# Patient Record
Sex: Female | Born: 1935 | Race: White | Hispanic: No | Smoking: Never smoker
Health system: Southern US, Community
[De-identification: ages and names within clinical notes are randomized; demographics above are authoritative.]

## PROBLEM LIST (undated history)

## (undated) DIAGNOSIS — I10 Essential (primary) hypertension: Secondary | ICD-10-CM

## (undated) DIAGNOSIS — R32 Unspecified urinary incontinence: Secondary | ICD-10-CM

## (undated) DIAGNOSIS — M810 Age-related osteoporosis without current pathological fracture: Secondary | ICD-10-CM

## (undated) DIAGNOSIS — D649 Anemia, unspecified: Secondary | ICD-10-CM

## (undated) DIAGNOSIS — K5900 Constipation, unspecified: Secondary | ICD-10-CM

## (undated) DIAGNOSIS — F329 Major depressive disorder, single episode, unspecified: Secondary | ICD-10-CM

## (undated) DIAGNOSIS — G4700 Insomnia, unspecified: Secondary | ICD-10-CM

## (undated) DIAGNOSIS — M1990 Unspecified osteoarthritis, unspecified site: Secondary | ICD-10-CM

## (undated) DIAGNOSIS — I4510 Unspecified right bundle-branch block: Secondary | ICD-10-CM

## (undated) DIAGNOSIS — E785 Hyperlipidemia, unspecified: Secondary | ICD-10-CM

## (undated) DIAGNOSIS — J45909 Unspecified asthma, uncomplicated: Secondary | ICD-10-CM

## (undated) DIAGNOSIS — M25559 Pain in unspecified hip: Secondary | ICD-10-CM

## (undated) DIAGNOSIS — N390 Urinary tract infection, site not specified: Secondary | ICD-10-CM

## (undated) DIAGNOSIS — R609 Edema, unspecified: Secondary | ICD-10-CM

## (undated) DIAGNOSIS — F32A Depression, unspecified: Secondary | ICD-10-CM

## (undated) DIAGNOSIS — I2510 Atherosclerotic heart disease of native coronary artery without angina pectoris: Secondary | ICD-10-CM

## (undated) DIAGNOSIS — K219 Gastro-esophageal reflux disease without esophagitis: Secondary | ICD-10-CM

## (undated) DIAGNOSIS — Z9181 History of falling: Secondary | ICD-10-CM

## (undated) DIAGNOSIS — F4321 Adjustment disorder with depressed mood: Secondary | ICD-10-CM

## (undated) DIAGNOSIS — F0280 Dementia in other diseases classified elsewhere without behavioral disturbance: Secondary | ICD-10-CM

## (undated) HISTORY — DX: Constipation, unspecified: K59.00

## (undated) HISTORY — PX: CHOLECYSTECTOMY: SHX55

## (undated) HISTORY — DX: Edema, unspecified: R60.9

## (undated) HISTORY — DX: History of falling: Z91.81

## (undated) HISTORY — DX: Insomnia, unspecified: G47.00

## (undated) HISTORY — DX: Urinary tract infection, site not specified: N39.0

## (undated) HISTORY — DX: Pain in unspecified hip: M25.559

## (undated) HISTORY — DX: Depression, unspecified: F32.A

## (undated) HISTORY — DX: Hyperlipidemia, unspecified: E78.5

## (undated) HISTORY — DX: Age-related osteoporosis without current pathological fracture: M81.0

## (undated) HISTORY — DX: Dementia in other diseases classified elsewhere, unspecified severity, without behavioral disturbance, psychotic disturbance, mood disturbance, and anxiety: F02.80

## (undated) HISTORY — DX: Major depressive disorder, single episode, unspecified: F32.9

## (undated) HISTORY — DX: Atherosclerotic heart disease of native coronary artery without angina pectoris: I25.10

## (undated) HISTORY — DX: Unspecified right bundle-branch block: I45.10

## (undated) HISTORY — DX: Adjustment disorder with depressed mood: F43.21

## (undated) HISTORY — PX: ABDOMINAL HYSTERECTOMY: SHX81

## (undated) HISTORY — DX: Gastro-esophageal reflux disease without esophagitis: K21.9

## (undated) HISTORY — DX: Unspecified urinary incontinence: R32

## (undated) HISTORY — DX: Unspecified asthma, uncomplicated: J45.909

## (undated) HISTORY — DX: Anemia, unspecified: D64.9

---

## 2001-05-22 ENCOUNTER — Emergency Department (HOSPITAL_COMMUNITY): Admission: EM | Admit: 2001-05-22 | Discharge: 2001-05-22 | Payer: Self-pay | Admitting: Emergency Medicine

## 2002-11-21 ENCOUNTER — Ambulatory Visit (HOSPITAL_COMMUNITY): Admission: RE | Admit: 2002-11-21 | Discharge: 2002-11-21 | Payer: Self-pay | Admitting: Gastroenterology

## 2002-11-21 ENCOUNTER — Encounter: Payer: Self-pay | Admitting: Gastroenterology

## 2003-08-04 ENCOUNTER — Encounter: Admission: RE | Admit: 2003-08-04 | Discharge: 2003-08-04 | Payer: Self-pay | Admitting: Orthopaedic Surgery

## 2006-06-15 ENCOUNTER — Encounter: Admission: RE | Admit: 2006-06-15 | Discharge: 2006-06-15 | Payer: Self-pay | Admitting: Orthopaedic Surgery

## 2008-04-10 ENCOUNTER — Encounter: Admission: RE | Admit: 2008-04-10 | Discharge: 2008-06-12 | Payer: Self-pay | Admitting: Orthopaedic Surgery

## 2008-06-14 ENCOUNTER — Encounter: Admission: RE | Admit: 2008-06-14 | Discharge: 2008-08-29 | Payer: Self-pay | Admitting: Orthopaedic Surgery

## 2008-06-23 ENCOUNTER — Encounter: Admission: RE | Admit: 2008-06-23 | Discharge: 2008-06-23 | Payer: Self-pay | Admitting: Orthopaedic Surgery

## 2008-07-28 ENCOUNTER — Encounter: Admission: RE | Admit: 2008-07-28 | Discharge: 2008-07-28 | Payer: Self-pay | Admitting: Internal Medicine

## 2009-01-19 ENCOUNTER — Emergency Department (HOSPITAL_COMMUNITY): Admission: EM | Admit: 2009-01-19 | Discharge: 2009-01-19 | Payer: Self-pay | Admitting: Emergency Medicine

## 2009-09-13 ENCOUNTER — Encounter: Admission: RE | Admit: 2009-09-13 | Discharge: 2009-09-13 | Payer: Self-pay | Admitting: Internal Medicine

## 2010-05-11 ENCOUNTER — Encounter: Payer: Self-pay | Admitting: Orthopaedic Surgery

## 2010-07-28 ENCOUNTER — Encounter (INDEPENDENT_AMBULATORY_CARE_PROVIDER_SITE_OTHER): Payer: MEDICARE

## 2010-07-28 DIAGNOSIS — R002 Palpitations: Secondary | ICD-10-CM

## 2010-09-05 NOTE — Op Note (Signed)
NAME:  Joann Baker, Joann Baker                          ACCOUNT NO.:  [id number]   MEDICAL RECORD NO.:  [id number]                   PATIENT TYPE:  AMB   LOCATION:  ENDO                                 FACILITY:  [facility]   PHYSICIAN:  Petra Kuba, M.D.                 DATE OF BIRTH:  March 21, 1925   DATE OF PROCEDURE:  11/21/2002  DATE OF DISCHARGE:                                 OPERATIVE REPORT   PROCEDURE:  Colonoscopy.   INDICATION:  The patient with longstanding irritable bowel, well overdue for  colonic screening.  Consent was signed after risks, benefits, methods,  options thoroughly discussed in the office.   MEDICINES USED:  1. Demerol 50.  2. Versed 5.   DESCRIPTION OF PROCEDURE:  Rectal inspection was pertinent for external  hemorrhoids, small.  Digital exam was negative.  First, the pediatric video  adjustable colonoscope was inserted and with some difficulty due to looping,  tortuosity and despite rolling her on her back and her right side, was only  able to advance to the approximate level of the hepatic flexure or proximal  transverse.  She did have discomfort with advancing, and we were  uncomfortable pushing any harder, so we elected to withdraw.  No  abnormalities but some rare left-sided diverticula were seen on withdrawal.  Anorectal pull-through and retroflexion confirmed some hemorrhoids.  We went  ahead and reinserted the upper endoscope and were easily able to advance to  approximately the mid transverse but despite abdominal pressure and rolling  her on her back, we ran out of scope and could not advance any further.  This scope was withdrawn as well without additional findings.  Again,  anorectal pull-through and retroflexion confirmed some hemorrhoids.  Scope  was reinserted a short ways up the left side of the colon; air was  suctioned, scope removed.  The patient tolerated the procedure well.  There  was no obvious immediate complication.   ENDOSCOPIC  DIAGNOSES:  1. Internal-external hemorrhoids.  2. Few sigmoid diverticula.  3. Unable to advance either the peds colonoscope or the upper endoscope     passed the probable proximal transverse.  4. No other abnormalities seen.   PLAN:  1. We will go ahead and get a contrast barium enema since she is well-     prepped today just to completely screen her colon.  2.     Otherwise, return care to Larina Earthly, M.D. for the customary health care     maintenance to include yearly rectals and guaiacs.  3. Happy to see back p.r.n.                                               Petra Kuba, M.D.    MEM/MEDQ  D:  11/21/2002  T:  11/21/2002  Job:  [id number]   cc:   Larina Earthly, M.D.  [address]  [place]  [place] [address]  Fax: [phone]

## 2011-06-18 ENCOUNTER — Other Ambulatory Visit: Payer: Self-pay

## 2011-06-18 ENCOUNTER — Emergency Department (HOSPITAL_COMMUNITY): Payer: Medicare Other

## 2011-06-18 ENCOUNTER — Encounter (HOSPITAL_COMMUNITY): Payer: Self-pay

## 2011-06-18 ENCOUNTER — Encounter (HOSPITAL_COMMUNITY)
Admission: EM | Disposition: A | Discharge status: Skilled Nursing Facility | Payer: Self-pay | Source: Ambulatory Visit | Attending: Orthopedic Surgery

## 2011-06-18 ENCOUNTER — Inpatient Hospital Stay (HOSPITAL_COMMUNITY)
Admission: EM | Admit: 2011-06-18 | Discharge: 2011-06-23 | DRG: 482 | Disposition: A | Discharge status: Skilled Nursing Facility | Payer: Medicare Other | Source: Ambulatory Visit | Attending: Orthopedic Surgery | Admitting: Orthopedic Surgery

## 2011-06-18 ENCOUNTER — Inpatient Hospital Stay (HOSPITAL_COMMUNITY): Payer: Medicare Other

## 2011-06-18 DIAGNOSIS — W010XXA Fall on same level from slipping, tripping and stumbling without subsequent striking against object, initial encounter: Secondary | ICD-10-CM | POA: Diagnosis present

## 2011-06-18 DIAGNOSIS — I10 Essential (primary) hypertension: Secondary | ICD-10-CM | POA: Diagnosis present

## 2011-06-18 DIAGNOSIS — R3981 Functional urinary incontinence: Secondary | ICD-10-CM | POA: Diagnosis present

## 2011-06-18 DIAGNOSIS — Z888 Allergy status to other drugs, medicaments and biological substances status: Secondary | ICD-10-CM

## 2011-06-18 DIAGNOSIS — M1990 Unspecified osteoarthritis, unspecified site: Secondary | ICD-10-CM | POA: Diagnosis present

## 2011-06-18 DIAGNOSIS — F0390 Unspecified dementia without behavioral disturbance: Secondary | ICD-10-CM | POA: Diagnosis present

## 2011-06-18 DIAGNOSIS — S7292XA Unspecified fracture of left femur, initial encounter for closed fracture: Secondary | ICD-10-CM

## 2011-06-18 DIAGNOSIS — Z7982 Long term (current) use of aspirin: Secondary | ICD-10-CM

## 2011-06-18 DIAGNOSIS — S72409A Unspecified fracture of lower end of unspecified femur, initial encounter for closed fracture: Principal | ICD-10-CM | POA: Diagnosis present

## 2011-06-18 DIAGNOSIS — Z79899 Other long term (current) drug therapy: Secondary | ICD-10-CM

## 2011-06-18 HISTORY — DX: Essential (primary) hypertension: I10

## 2011-06-18 HISTORY — DX: Unspecified osteoarthritis, unspecified site: M19.90

## 2011-06-18 LAB — DIFFERENTIAL
Basophils Absolute: 0.1 10*3/uL (ref 0.0–0.1)
Basophils Relative: 0 % (ref 0–1)
Eosinophils Absolute: 0.1 K/uL (ref 0.0–0.7)
Eosinophils Relative: 1 % (ref 0–5)
Lymphocytes Relative: 23 % (ref 12–46)
Lymphs Abs: 2.9 10*3/uL (ref 0.7–4.0)
Monocytes Absolute: 0.7 K/uL (ref 0.1–1.0)
Monocytes Relative: 6 % (ref 3–12)
Neutro Abs: 8.8 10*3/uL — ABNORMAL HIGH (ref 1.7–7.7)
Neutrophils Relative %: 70 % (ref 43–77)

## 2011-06-18 LAB — CBC
HCT: 37 % (ref 36.0–46.0)
Hemoglobin: 12.7 g/dL (ref 12.0–15.0)
MCH: 30.9 pg (ref 26.0–34.0)
MCHC: 34.3 g/dL (ref 30.0–36.0)
MCV: 90 fL (ref 78.0–100.0)
Platelets: 200 10*3/uL (ref 150–400)
RBC: 4.11 MIL/uL (ref 3.87–5.11)
RDW: 13.5 % (ref 11.5–15.5)
WBC: 12.6 K/uL — ABNORMAL HIGH (ref 4.0–10.5)

## 2011-06-18 LAB — URINALYSIS, ROUTINE W REFLEX MICROSCOPIC
Bilirubin Urine: NEGATIVE
Glucose, UA: NEGATIVE mg/dL
Hgb urine dipstick: NEGATIVE
Ketones, ur: NEGATIVE mg/dL
Leukocytes, UA: NEGATIVE
Nitrite: NEGATIVE
Protein, ur: NEGATIVE mg/dL
Specific Gravity, Urine: 1.014 (ref 1.005–1.030)
Urobilinogen, UA: 0.2 mg/dL (ref 0.0–1.0)
pH: 7 (ref 5.0–8.0)

## 2011-06-18 LAB — BASIC METABOLIC PANEL
BUN: 17 mg/dL (ref 6–23)
Chloride: 101 mEq/L (ref 96–112)
Glucose, Bld: 135 mg/dL — ABNORMAL HIGH (ref 70–99)
Potassium: 4.2 mEq/L (ref 3.5–5.1)
Sodium: 139 mEq/L (ref 135–145)

## 2011-06-18 LAB — BASIC METABOLIC PANEL WITH GFR
CO2: 27 meq/L (ref 19–32)
Calcium: 11.1 mg/dL — ABNORMAL HIGH (ref 8.4–10.5)
Creatinine, Ser: 1.06 mg/dL (ref 0.50–1.10)
GFR calc Af Amer: 54 mL/min — ABNORMAL LOW (ref >90)
GFR calc non Af Amer: 47 mL/min — ABNORMAL LOW (ref >90)

## 2011-06-18 SURGERY — INSERTION, INTRAMEDULLARY ROD, FEMUR
Anesthesia: General | Laterality: Left

## 2011-06-18 MED ORDER — HYDROMORPHONE HCL PF 1 MG/ML IJ SOLN
1.0000 mg | Freq: Once | INTRAMUSCULAR | Status: AC
  Administered 2011-06-18: 1 mg via INTRAVENOUS
  Filled 2011-06-18: qty 1

## 2011-06-18 MED ORDER — DEXTROSE-NACL 5-0.45 % IV SOLN
INTRAVENOUS | Status: DC
  Administered 2011-06-19 – 2011-06-20 (×2): via INTRAVENOUS

## 2011-06-18 MED ORDER — ONDANSETRON HCL 4 MG/2ML IJ SOLN
4.0000 mg | Freq: Once | INTRAMUSCULAR | Status: AC
  Administered 2011-06-18: 4 mg via INTRAVENOUS

## 2011-06-18 MED ORDER — ONDANSETRON HCL 4 MG/2ML IJ SOLN
INTRAMUSCULAR | Status: AC
  Administered 2011-06-18: 4 mg via INTRAVENOUS
  Filled 2011-06-18: qty 2

## 2011-06-18 MED ORDER — MORPHINE SULFATE 4 MG/ML IJ SOLN
4.0000 mg | Freq: Once | INTRAMUSCULAR | Status: AC
  Administered 2011-06-18: 4 mg via INTRAVENOUS
  Filled 2011-06-18: qty 1

## 2011-06-18 NOTE — ED Notes (Signed)
Pt back from X-Ray.  

## 2011-06-18 NOTE — Progress Notes (Signed)
Orthopedic Tech Progress Note Patient Details:  Joann Baker 06/21/1924 [id number]  Patient ID: [name], female   DOB: 1924/12/26, 78 y.o.   MRN: [id number]   [name] 06/18/2011, 10:29 PM

## 2011-06-18 NOTE — ED Notes (Signed)
Pt has +pulses and movement in lt leg

## 2011-06-18 NOTE — ED Notes (Signed)
Jewelry given to son, Chip Francesco Runner, as follows: yellow colored earring x2, 1 ring with blue colored stones surrounding 1 clear stone, 1 ring with cluster of 8 clear stones, 1 ring with 3 clear stones, and 1 seiko watch; pt aware that all jewelry has been given to her son and is in agreement

## 2011-06-18 NOTE — Progress Notes (Signed)
Orthopedic Tech Progress Note Patient Details:  Joann Baker 1924-09-13 [id number]  Musculoskeletal Traction Type of Traction: Joann Baker Better Traction Traction Location: (L) LE    [name] 06/18/2011, 10:29 PM

## 2011-06-18 NOTE — ED Provider Notes (Signed)
History     CSN: [id number]  Arrival date & time 06/18/11  1748   First MD Initiated Contact with Patient 06/18/11 1803      Chief Complaint  Patient presents with  . Fall    Pt fell and felt pop in left thigh.  +PMS, fentanyl , #20 lt hand.  Pt told EMS that she did not pass out and fell, thinks her leg gave out.  Denies neck and back pain, denies LOC.    (Consider location/radiation/quality/duration/timing/severity/associated sxs/prior treatment) Patient is a 78 y.o. female presenting with leg pain. The history is provided by the patient.  Leg Pain  The incident occurred 1 to 2 hours ago. The incident occurred at home. The injury mechanism was a fall. The pain is present in the left thigh. The quality of the pain is described as aching. The pain is at a severity of 10/10. The pain is severe. The pain has been constant since onset. Associated symptoms include inability to bear weight and loss of motion. Pertinent negatives include no numbness, no loss of sensation and no tingling. She reports no foreign bodies present. The symptoms are aggravated by bearing weight and activity. She has tried nothing for the symptoms.    Past Medical History  Diagnosis Date  . Hypertension   . DJD (degenerative joint disease)     No past surgical history on file.  No family history on file.  History  Substance Use Topics  . Smoking status: Not on file  . Smokeless tobacco: Not on file  . Alcohol Use:     OB History    Grav Para Term Preterm Abortions TAB SAB Ect Mult Living                  Review of Systems  Respiratory: Negative for shortness of breath.   Cardiovascular: Negative for chest pain.  Gastrointestinal: Negative for nausea, vomiting, abdominal pain and diarrhea.  Neurological: Negative for dizziness, tingling, syncope, weakness and numbness.  All other systems reviewed and are negative.    Allergies  Flovent diskus; Fosamax; Omeprazole; and Symbicort  Home  Medications  No current outpatient prescriptions on file.  BP 157/60  Temp(Src) 97.8 F (36.6 C) (Oral)  Resp 16  SpO2 98%  Physical Exam  Nursing note and vitals reviewed. Constitutional: She is oriented to person, place, and time. She appears well-developed and well-nourished. No distress.  HENT:  Head: Normocephalic and atraumatic.  Mouth/Throat: Oropharynx is clear and moist.  Eyes: EOM are normal. Pupils are equal, round, and reactive to light.  Neck: Normal range of motion. Neck supple.  Cardiovascular: Normal rate, regular rhythm and normal heart sounds.  Exam reveals no friction rub.   No murmur heard. Pulmonary/Chest: Effort normal and breath sounds normal. No respiratory distress. She has no wheezes. She has no rales.  Abdominal: Soft. There is no tenderness. There is no rebound and no guarding.  Musculoskeletal: Normal range of motion. She exhibits tenderness (swelling with severe tenderness over left mid thigh. Patient able to wiggle toes and left foot. She is unable to actively lift her left leg. Dorsalis pedis pulses 2+ and symmetric bilaterally. Light touch sensation intact.). She exhibits no edema.  Lymphadenopathy:    She has no cervical adenopathy.  Neurological: She is alert and oriented to person, place, and time. She has normal strength. No cranial nerve deficit or sensory deficit. GCS eye subscore is 4. GCS verbal subscore is 5. GCS motor subscore is 6.  Patient unable to actively lift left leg. Strength otherwise 5/5 in all extremities.  Skin: Skin is warm and dry. No rash noted.  Psychiatric: She has a normal mood and affect. Her behavior is normal.    ED Course  Procedures (including critical care time)  Results for orders placed during the hospital encounter of 06/18/11  CBC      Component Value Range   WBC 12.6 (*) 4.0 - 10.5 (K/uL)   RBC 4.11  3.87 - 5.11 (MIL/uL)   Hemoglobin 12.7  12.0 - 15.0 (g/dL)   HCT 16.1  09.6 - 04.5 (%)   MCV 90.0   78.0 - 100.0 (fL)   MCH 30.9  26.0 - 34.0 (pg)   MCHC 34.3  30.0 - 36.0 (g/dL)   RDW 40.9  81.1 - 91.4 (%)   Platelets 200  150 - 400 (K/uL)  DIFFERENTIAL      Component Value Range   Neutrophils Relative 70  43 - 77 (%)   Neutro Abs 8.8 (*) 1.7 - 7.7 (K/uL)   Lymphocytes Relative 23  12 - 46 (%)   Lymphs Abs 2.9  0.7 - 4.0 (K/uL)   Monocytes Relative 6  3 - 12 (%)   Monocytes Absolute 0.7  0.1 - 1.0 (K/uL)   Eosinophils Relative 1  0 - 5 (%)   Eosinophils Absolute 0.1  0.0 - 0.7 (K/uL)   Basophils Relative 0  0 - 1 (%)   Basophils Absolute 0.1  0.0 - 0.1 (K/uL)  BASIC METABOLIC PANEL      Component Value Range   Sodium 139  135 - 145 (mEq/L)   Potassium 4.2  3.5 - 5.1 (mEq/L)   Chloride 101  96 - 112 (mEq/L)   CO2 27  19 - 32 (mEq/L)   Glucose, Bld 135 (*) 70 - 99 (mg/dL)   BUN 17  6 - 23 (mg/dL)   Creatinine, Ser 7.82  0.50 - 1.10 (mg/dL)   Calcium 95.6 (*) 8.4 - 10.5 (mg/dL)   GFR calc non Af Amer 47 (*) >90 (mL/min)   GFR calc Af Amer 54 (*) >90 (mL/min)  URINALYSIS, ROUTINE W REFLEX MICROSCOPIC      Component Value Range   Color, Urine YELLOW  YELLOW    APPearance CLEAR  CLEAR    Specific Gravity, Urine 1.014  1.005 - 1.030    pH 7.0  5.0 - 8.0    Glucose, UA NEGATIVE  NEGATIVE (mg/dL)   Hgb urine dipstick NEGATIVE  NEGATIVE    Bilirubin Urine NEGATIVE  NEGATIVE    Ketones, ur NEGATIVE  NEGATIVE (mg/dL)   Protein, ur NEGATIVE  NEGATIVE (mg/dL)   Urobilinogen, UA 0.2  0.0 - 1.0 (mg/dL)   Nitrite NEGATIVE  NEGATIVE    Leukocytes, UA NEGATIVE  NEGATIVE                                                                                                                              '  Dg Pelvis 1-2 Views  06/18/2011  *RADIOLOGY REPORT*  Clinical Data: Fall, left hip and leg pain  PELVIS - 1-2 VIEW  Comparison:  None.  Findings:  There is no evidence of pelvic fracture or diastasis. No other pelvic bone lesions are seen. Mild joint space narrowing both  hips and irregularity of pubic symphysis.  IMPRESSION: Negative for acute abnormality.  Original Report Authenticated By: Elsie Stain, M.D.   Dg Femur Left  06/18/2011  *RADIOLOGY REPORT*  Clinical Data: Larey Seat, pain  LEFT FEMUR - 2 VIEW  Comparison: 02/06/2009  Findings: There is an acute oblique fracture of the mid shaft of the left femur with overriding fracture fragments.  This has occurred at the through an area of chronic stress injury as demonstrated on prior films.  Soft tissue swelling is present. There is mild vascular calcification.  IMPRESSION: Acute midshaft left femur fracture.  Original Report Authenticated By: Elsie Stain, M.D.   Dg Chest Portable 1 View  06/19/2011  *RADIOLOGY REPORT*  Clinical Data: Preoperative chest radiograph for femoral fracture.  PORTABLE CHEST - 1 VIEW  Comparison: Chest radiograph performed 06/18/2011  This was a repeat chest radiograph, performed due to a duplicate order.  There will be no charge to the patient for this study.  Findings: The lungs are well-aerated.  Mild chronic vascular congestion is suspected, slightly better characterized than on the prior study.  Minimal left basilar airspace opacity likely reflects atelectasis.  There is no evidence of pleural effusion or pneumothorax.  Chronic biapical pleural thickening is noted.  The cardiomediastinal silhouette is borderline normal in size; calcification is noted in the aortic arch.  No acute osseous abnormalities are seen.  IMPRESSION: Mild chronic vascular congestion suspected; minimal left basilar airspace opacity likely reflects atelectasis.  Otherwise no significant change from the recent prior study.  Original Report Authenticated By: Tonia Ghent, M.D.   Dg Chest Portable 1 View  06/18/2011  *RADIOLOGY REPORT*  Clinical Data: Preoperative chest radiograph for fixation of left lower extremity fracture.  PORTABLE CHEST - 1 VIEW  Comparison: None.  Findings: The lungs are well-aerated and clear.   There is no evidence of focal opacification, pleural effusion or pneumothorax.  The cardiomediastinal silhouette is borderline normal in size; the indistinctness of the left side of the mediastinum is thought to reflect adjacent pericardial fat.  No acute osseous abnormalities are seen.  IMPRESSION: No acute cardiopulmonary process seen.  No displaced rib fractures identified.  Original Report Authenticated By: Tonia Ghent, M.D.   Imaging and reviewed by me, interpreted by radiologist.  1. Femur fracture, left       MDM  51:78 PM 78 year old female with history of hypertension and osteoporosis presenting with a fall at home and left leg pain. Patient states she's getting ready to use the restroom was standing up when she turned to the side and all the sudden fell over. She denies any lightheadedness, dizziness, near syncope or syncope. She is unsure as to exactly why she fell. She denies any recent fever, cough, congestion, abdominal pain, nausea or vomiting. She states that she felt a pop in her left leg. She does have some left eye swelling and severe tenderness with inability to delay. We'll check CBC, BMP, urinalysis, EKG along with plain films of the pelvis, left hip and left femur.  8:49 PM patient has remained stable. Plain film demonstrates a left midshaft femur fracture. Orthopedic surgery has been counseled.  Dr. Luiz Blare with orthopedic surgery has evaluated the  patient. The patient has been admitted to orthopedic surgery with plans for operative fixation.    Sheran Luz, MD 06/19/11 [phone]

## 2011-06-18 NOTE — ED Notes (Signed)
Patient transported to X-ray 

## 2011-06-18 NOTE — ED Notes (Signed)
Pt from xray

## 2011-06-18 NOTE — H&P (Signed)
PREOPERATIVE H&P  Chief Complaint: l. Femur fracture  HPI: Joann Baker is a 78 y.o. female who presents for evaluation of l. Femur fracture. It has been present for 1 day and has been worsening. She has failed conservative measures. Pain is rated as moderate.previous history of stress fracture of l. Femur. No history of heart attack or stroke.  Past Medical History  Diagnosis Date  . Hypertension   . DJD (degenerative joint disease)    No past surgical history on file. History   Social History  . Marital Status: Widowed    Spouse Name: N/A    Number of Children: N/A  . Years of Education: N/A   Social History Main Topics  . Smoking status: Not on file  . Smokeless tobacco: Not on file  . Alcohol Use:   . Drug Use:   . Sexually Active:    Other Topics Concern  . Not on file   Social History Narrative  . No narrative on file   No family history on file. Allergies  Allergen Reactions  . Asmanex     Per mar  . Flovent Diskus     Per mar  . Fosamax     Per mar  . Omeprazole     Per mar  . Symbicort     Per mar  . Valium (Diazepam)    Prior to Admission medications   Medication Sig Start Date End Date Taking? Authorizing Provider  amLODipine (NORVASC) 2.5 MG tablet Take 2.5 mg by mouth daily.   Yes Historical Provider, MD  aspirin 81 MG chewable tablet Chew 81 mg by mouth daily.   Yes Historical Provider, MD  atorvastatin (LIPITOR) 10 MG tablet Take 10 mg by mouth every evening.   Yes Historical Provider, MD  calcium-vitamin D (OSCAL WITH D) 500-200 MG-UNIT per tablet Take 1 tablet by mouth 3 (three) times daily.   Yes Historical Provider, MD  fish oil-omega-3 fatty acids 1000 MG capsule Take 1 g by mouth daily.   Yes Historical Provider, MD  isosorbide mononitrate (IMDUR) 60 MG 24 hr tablet Take 30 mg by mouth daily.   Yes Historical Provider, MD  memantine (NAMENDA) 10 MG tablet Take 10 mg by mouth 2 (two) times daily.   Yes Historical Provider, MD  metoprolol  (LOPRESSOR) 50 MG tablet Take 50 mg by mouth daily.   Yes Historical Provider, MD  Multiple Vitamins-Minerals (CERTAVITE SENIOR/ANTIOXIDANT PO) Take 1 tablet by mouth daily.   Yes Historical Provider, MD  omeprazole-sodium bicarbonate (ZEGERID) 40-1100 MG per capsule Take 1 capsule by mouth 2 (two) times daily.   Yes Historical Provider, MD  polyethylene glycol (MIRALAX / GLYCOLAX) packet Take 17 g by mouth daily.   Yes Historical Provider, MD  rivastigmine (EXELON) 4.6 mg/24hr Place 1 patch onto the skin daily.   Yes Historical Provider, MD     Positive ROS: none  All other systems have been reviewed and were otherwise negative with the exception of those mentioned in the HPI and as above.  Physical Exam: Filed Vitals:   06/18/11 2045  BP: 123/41  Pulse: 59  Temp:   Resp: 17    General: Alert, no acute distress Cardiovascular: No pedal edema Respiratory: No cyanosis, no use of accessory musculature GI: No organomegaly, abdomen is soft and non-tender Skin: No lesions in the area of chief complaint Neurologic: Sensation intact distally Psychiatric: Patient is competent for consent with normal mood and affect Lymphatic: No axillary or cervical lymphadenopathy  MUSCULOSKELETAL: L. Leg swollen and shortened.  2+ distal pulse pain on all ROM.  Assessment/Plan: 78 yo female with low energy femur fracture./Plan for IM rod of left femur when OR available.  The risks benefits and alternatives were discussed with the patient including but not limited to the risks of nonoperative treatment, versus surgical intervention including infection, bleeding, nerve injury, malunion, nonunion, hardware prominence, hardware failure, need for hardware removal, blood clots, cardiopulmonary complications, morbidity, mortality, among others, and they were willing to proceed.  Predicted outcome is good, although there will be at least a six to nine month expected recovery.  Harvie Junior,  MD 06/18/2011 9:35 PM

## 2011-06-18 NOTE — ED Notes (Signed)
Ortho MD at bedside.

## 2011-06-19 ENCOUNTER — Inpatient Hospital Stay (HOSPITAL_COMMUNITY): Payer: Medicare Other

## 2011-06-19 ENCOUNTER — Inpatient Hospital Stay (HOSPITAL_COMMUNITY): Payer: Medicare Other | Admitting: Anesthesiology

## 2011-06-19 ENCOUNTER — Encounter (HOSPITAL_COMMUNITY): Payer: Self-pay | Admitting: Anesthesiology

## 2011-06-19 ENCOUNTER — Encounter (HOSPITAL_COMMUNITY): Payer: Self-pay | Admitting: *Deleted

## 2011-06-19 ENCOUNTER — Encounter (HOSPITAL_COMMUNITY)
Admission: EM | Disposition: A | Discharge status: Skilled Nursing Facility | Payer: Self-pay | Source: Ambulatory Visit | Attending: Orthopedic Surgery

## 2011-06-19 DIAGNOSIS — S7292XA Unspecified fracture of left femur, initial encounter for closed fracture: Secondary | ICD-10-CM | POA: Diagnosis present

## 2011-06-19 HISTORY — PX: FEMUR IM NAIL: SHX1597

## 2011-06-19 LAB — BASIC METABOLIC PANEL
BUN: 18 mg/dL (ref 6–23)
CO2: 26 mEq/L (ref 19–32)
Calcium: 9.6 mg/dL (ref 8.4–10.5)
Chloride: 102 mEq/L (ref 96–112)
GFR calc Af Amer: 58 mL/min — ABNORMAL LOW (ref >90)
GFR calc non Af Amer: 50 mL/min — ABNORMAL LOW (ref >90)
Glucose, Bld: 156 mg/dL — ABNORMAL HIGH (ref 70–99)
Potassium: 4.6 mEq/L (ref 3.5–5.1)
Sodium: 139 mEq/L (ref 135–145)

## 2011-06-19 LAB — CBC
HCT: 35.7 % — ABNORMAL LOW (ref 36.0–46.0)
MCH: 30.9 pg (ref 26.0–34.0)
MCHC: 33.9 g/dL (ref 30.0–36.0)
MCV: 91.1 fL (ref 78.0–100.0)
Platelets: 190 10*3/uL (ref 150–400)
RBC: 3.47 MIL/uL — ABNORMAL LOW (ref 3.87–5.11)
RDW: 13.6 % (ref 11.5–15.5)
WBC: 15.3 10*3/uL — ABNORMAL HIGH (ref 4.0–10.5)

## 2011-06-19 LAB — PROTIME-INR
INR: 1.1 (ref 0.00–1.49)
Prothrombin Time: 14.4 seconds (ref 11.6–15.2)

## 2011-06-19 LAB — SURGICAL PCR SCREEN: Staphylococcus aureus: NEGATIVE

## 2011-06-19 SURGERY — INSERTION, INTRAMEDULLARY ROD, FEMUR
Anesthesia: General | Site: Leg Upper | Laterality: Left | Wound class: Clean

## 2011-06-19 MED ORDER — ZOLPIDEM TARTRATE 5 MG PO TABS: 5.0000 mg | ORAL_TABLET | Freq: Every evening | ORAL | Status: DC | PRN

## 2011-06-19 MED ORDER — COUMADIN BOOK
Freq: Once | Status: AC
  Administered 2011-06-19: 18:00:00
  Filled 2011-06-19: qty 1

## 2011-06-19 MED ORDER — PROPOFOL 10 MG/ML IV BOLUS
INTRAVENOUS | Status: DC | PRN
  Administered 2011-06-19: 120 mg via INTRAVENOUS

## 2011-06-19 MED ORDER — ACETAMINOPHEN 650 MG RE SUPP: 650.0000 mg | Freq: Four times a day (QID) | RECTAL | Status: DC | PRN

## 2011-06-19 MED ORDER — CEFAZOLIN SODIUM 1-5 GM-% IV SOLN
1.0000 g | Freq: Four times a day (QID) | INTRAVENOUS | Status: AC
  Administered 2011-06-19 (×2): 1 g via INTRAVENOUS
  Filled 2011-06-19 (×4): qty 50

## 2011-06-19 MED ORDER — ACETAMINOPHEN 325 MG PO TABS
650.0000 mg | ORAL_TABLET | Freq: Four times a day (QID) | ORAL | Status: DC | PRN
  Administered 2011-06-21 – 2011-06-22 (×2): 650 mg via ORAL
  Filled 2011-06-19 (×2): qty 2

## 2011-06-19 MED ORDER — WARFARIN SODIUM 5 MG PO TABS
5.0000 mg | ORAL_TABLET | Freq: Once | ORAL | Status: AC
  Administered 2011-06-19: 5 mg via ORAL
  Filled 2011-06-19: qty 1

## 2011-06-19 MED ORDER — RIVASTIGMINE 4.6 MG/24HR TD PT24
4.6000 mg | MEDICATED_PATCH | Freq: Every day | TRANSDERMAL | Status: DC
  Administered 2011-06-20 – 2011-06-23 (×4): 4.6 mg via TRANSDERMAL
  Filled 2011-06-19 (×6): qty 1

## 2011-06-19 MED ORDER — ALUM & MAG HYDROXIDE-SIMETH 200-200-20 MG/5ML PO SUSP: 30.0000 mL | ORAL | Status: DC | PRN

## 2011-06-19 MED ORDER — WHITE PETROLATUM GEL
Status: AC
  Administered 2011-06-19: 10:00:00
  Filled 2011-06-19: qty 5

## 2011-06-19 MED ORDER — SODIUM CHLORIDE 0.9 % IV SOLN
10.0000 mg | INTRAVENOUS | Status: DC | PRN
  Administered 2011-06-19: 25 ug/min via INTRAVENOUS

## 2011-06-19 MED ORDER — METHOCARBAMOL 100 MG/ML IJ SOLN
500.0000 mg | Freq: Four times a day (QID) | INTRAVENOUS | Status: DC | PRN
  Filled 2011-06-19: qty 5

## 2011-06-19 MED ORDER — TRAMADOL HCL 50 MG PO TABS
50.0000 mg | ORAL_TABLET | Freq: Four times a day (QID) | ORAL | Status: DC | PRN
  Administered 2011-06-19 – 2011-06-23 (×8): 50 mg via ORAL
  Filled 2011-06-19 (×10): qty 1

## 2011-06-19 MED ORDER — WARFARIN VIDEO
Freq: Once | Status: AC
  Administered 2011-06-19: 18:00:00

## 2011-06-19 MED ORDER — POLYETHYLENE GLYCOL 3350 17 G PO PACK
17.0000 g | PACK | Freq: Every day | ORAL | Status: DC
  Administered 2011-06-19 – 2011-06-23 (×5): 17 g via ORAL
  Filled 2011-06-19 (×5): qty 1

## 2011-06-19 MED ORDER — HYDROCODONE-ACETAMINOPHEN 5-325 MG PO TABS
1.0000 | ORAL_TABLET | ORAL | Status: DC | PRN
Start: 2011-06-19 — End: 2011-06-19

## 2011-06-19 MED ORDER — ONDANSETRON HCL 4 MG/2ML IJ SOLN
INTRAMUSCULAR | Status: DC | PRN
  Administered 2011-06-19: 4 mg via INTRAVENOUS

## 2011-06-19 MED ORDER — CEFAZOLIN SODIUM 1-5 GM-% IV SOLN
INTRAVENOUS | Status: DC | PRN
  Administered 2011-06-19: 1 g via INTRAVENOUS

## 2011-06-19 MED ORDER — METHOCARBAMOL 500 MG PO TABS
500.0000 mg | ORAL_TABLET | Freq: Four times a day (QID) | ORAL | Status: DC | PRN
  Administered 2011-06-21: 500 mg via ORAL
  Filled 2011-06-19 (×3): qty 1

## 2011-06-19 MED ORDER — FERROUS SULFATE 325 (65 FE) MG PO TABS
325.0000 mg | ORAL_TABLET | Freq: Two times a day (BID) | ORAL | Status: DC
  Administered 2011-06-19 – 2011-06-22 (×8): 325 mg via ORAL
  Filled 2011-06-19 (×11): qty 1

## 2011-06-19 MED ORDER — ONDANSETRON HCL 4 MG/2ML IJ SOLN: 4.0000 mg | Freq: Four times a day (QID) | INTRAMUSCULAR | Status: DC | PRN

## 2011-06-19 MED ORDER — WARFARIN - PHARMACIST DOSING INPATIENT
Freq: Every day | Status: DC
  Administered 2011-06-20: 18:00:00
  Filled 2011-06-19 (×5): qty 1

## 2011-06-19 MED ORDER — ONDANSETRON HCL 4 MG PO TABS: 4.0000 mg | ORAL_TABLET | Freq: Four times a day (QID) | ORAL | Status: DC | PRN

## 2011-06-19 MED ORDER — ISOSORBIDE MONONITRATE ER 30 MG PO TB24
30.0000 mg | ORAL_TABLET | Freq: Every day | ORAL | Status: DC
  Administered 2011-06-19 – 2011-06-23 (×4): 30 mg via ORAL
  Filled 2011-06-19 (×5): qty 1

## 2011-06-19 MED ORDER — LACTATED RINGERS IV SOLN
INTRAVENOUS | Status: DC | PRN
  Administered 2011-06-19: via INTRAVENOUS

## 2011-06-19 MED ORDER — CEFAZOLIN SODIUM 1-5 GM-% IV SOLN
INTRAVENOUS | Status: AC
  Filled 2011-06-19: qty 50

## 2011-06-19 MED ORDER — PANTOPRAZOLE SODIUM 40 MG PO TBEC
40.0000 mg | DELAYED_RELEASE_TABLET | Freq: Every day | ORAL | Status: DC
  Administered 2011-06-19 – 2011-06-23 (×5): 40 mg via ORAL
  Filled 2011-06-19 (×5): qty 1

## 2011-06-19 MED ORDER — ATORVASTATIN CALCIUM 10 MG PO TABS
10.0000 mg | ORAL_TABLET | Freq: Every evening | ORAL | Status: DC
  Administered 2011-06-19 – 2011-06-22 (×4): 10 mg via ORAL
  Filled 2011-06-19 (×5): qty 1

## 2011-06-19 MED ORDER — AMLODIPINE BESYLATE 2.5 MG PO TABS
2.5000 mg | ORAL_TABLET | Freq: Every day | ORAL | Status: DC
  Administered 2011-06-19 – 2011-06-23 (×4): 2.5 mg via ORAL
  Filled 2011-06-19 (×5): qty 1

## 2011-06-19 MED ORDER — GLYCOPYRROLATE 0.2 MG/ML IJ SOLN
INTRAMUSCULAR | Status: DC | PRN
  Administered 2011-06-19: 0.2 mg via INTRAVENOUS

## 2011-06-19 MED ORDER — CALCIUM CARBONATE-VITAMIN D 500-200 MG-UNIT PO TABS
1.0000 | ORAL_TABLET | Freq: Three times a day (TID) | ORAL | Status: DC
  Administered 2011-06-19 – 2011-06-23 (×13): 1 via ORAL
  Filled 2011-06-19 (×16): qty 1

## 2011-06-19 MED ORDER — FENTANYL CITRATE 0.05 MG/ML IJ SOLN: 25.0000 ug | INTRAMUSCULAR | Status: DC | PRN

## 2011-06-19 MED ORDER — HYDROMORPHONE HCL 1 MG/ML IJ SOLN
0.5000 mg | INTRAMUSCULAR | Status: DC | PRN
  Administered 2011-06-19 (×2): 0.5 mg via INTRAVENOUS
  Filled 2011-06-19 (×2): qty 1

## 2011-06-19 MED ORDER — FENTANYL CITRATE 0.05 MG/ML IJ SOLN: 50.0000 ug | INTRAMUSCULAR | Status: DC | PRN

## 2011-06-19 MED ORDER — METOPROLOL TARTRATE 50 MG PO TABS
50.0000 mg | ORAL_TABLET | Freq: Every day | ORAL | Status: DC
  Administered 2011-06-19 – 2011-06-23 (×4): 50 mg via ORAL
  Filled 2011-06-19 (×5): qty 1

## 2011-06-19 MED ORDER — SUCCINYLCHOLINE CHLORIDE 20 MG/ML IJ SOLN
INTRAMUSCULAR | Status: DC | PRN
  Administered 2011-06-19: 100 mg via INTRAVENOUS

## 2011-06-19 MED ORDER — DOCUSATE SODIUM 100 MG PO CAPS
100.0000 mg | ORAL_CAPSULE | Freq: Two times a day (BID) | ORAL | Status: DC
  Administered 2011-06-19 – 2011-06-23 (×9): 100 mg via ORAL
  Filled 2011-06-19 (×9): qty 1

## 2011-06-19 MED ORDER — MEMANTINE HCL 10 MG PO TABS
10.0000 mg | ORAL_TABLET | Freq: Two times a day (BID) | ORAL | Status: DC
  Administered 2011-06-19 – 2011-06-23 (×9): 10 mg via ORAL
  Filled 2011-06-19 (×10): qty 1

## 2011-06-19 SURGICAL SUPPLY — 43 items
BIT DRILL 3.8X6 NS (BIT) ×1 IMPLANT
BIT DRILL 5.3 (BIT) ×1 IMPLANT
BLADE SURG ROTATE 9660 (MISCELLANEOUS) ×2 IMPLANT
CLOTH BEACON ORANGE TIMEOUT ST (SAFETY) ×2 IMPLANT
COVER SURGICAL LIGHT HANDLE (MISCELLANEOUS) ×4 IMPLANT
DECANTER SPIKE VIAL GLASS SM (MISCELLANEOUS) ×2 IMPLANT
DRAPE STERI IOBAN 125X83 (DRAPES) ×2 IMPLANT
DRSG MEPILEX BORDER 4X4 (GAUZE/BANDAGES/DRESSINGS) ×2 IMPLANT
DRSG MEPILEX BORDER 4X8 (GAUZE/BANDAGES/DRESSINGS) ×2 IMPLANT
DURAPREP 26ML APPLICATOR (WOUND CARE) ×2 IMPLANT
ELECT CAUTERY BLADE 6.4 (BLADE) ×1 IMPLANT
ELECT REM PT RETURN 9FT ADLT (ELECTROSURGICAL) ×2
ELECTRODE REM PT RTRN 9FT ADLT (ELECTROSURGICAL) ×1 IMPLANT
EVACUATOR 1/8 PVC DRAIN (DRAIN) IMPLANT
GAUZE XEROFORM 5X9 LF (GAUZE/BANDAGES/DRESSINGS) ×2 IMPLANT
GLOVE BIOGEL PI IND STRL 8 (GLOVE) ×2 IMPLANT
GLOVE BIOGEL PI INDICATOR 8 (GLOVE) ×2
GLOVE ECLIPSE 7.5 STRL STRAW (GLOVE) ×4 IMPLANT
GOWN PREVENTION PLUS XLARGE (GOWN DISPOSABLE) ×2 IMPLANT
GOWN SRG XL XLNG 56XLVL 4 (GOWN DISPOSABLE) ×1 IMPLANT
GOWN STRL NON-REIN LRG LVL3 (GOWN DISPOSABLE) ×3 IMPLANT
GOWN STRL NON-REIN XL XLG LVL4 (GOWN DISPOSABLE) ×2
GUIDEPIN 3.2X17.5 THRD DISP (PIN) ×1 IMPLANT
GUIDEWIRE BALL NOSE 80CM (WIRE) ×1 IMPLANT
KIT BASIN OR (CUSTOM PROCEDURE TRAY) ×2 IMPLANT
KIT ROOM TURNOVER OR (KITS) ×2 IMPLANT
MANIFOLD NEPTUNE II (INSTRUMENTS) ×2 IMPLANT
NS IRRIG 1000ML POUR BTL (IV SOLUTION) ×2 IMPLANT
PACK GENERAL/GYN (CUSTOM PROCEDURE TRAY) ×2 IMPLANT
PAD ARMBOARD 7.5X6 YLW CONV (MISCELLANEOUS) ×4 IMPLANT
SCREW ACE CAP BN (Screw) ×2 IMPLANT
SCREW ACE CORTICAL 6.5X70MML (Screw) ×1 IMPLANT
SCREW ACECAP 40MM (Screw) ×1 IMPLANT
SCREW ACECAP 46MM (Screw) ×1 IMPLANT
SCREW BN OBLQ FT 54X4.5XST 2 (Screw) IMPLANT
STAPLER VISISTAT 35W (STAPLE) ×2 IMPLANT
SUT VIC AB 0 CTB1 27 (SUTURE) ×3 IMPLANT
SUT VIC AB 1 CTB1 27 (SUTURE) ×2 IMPLANT
SUT VIC AB 2-0 CTB1 (SUTURE) ×2 IMPLANT
TOWEL OR 17X24 6PK STRL BLUE (TOWEL DISPOSABLE) ×2 IMPLANT
TOWEL OR 17X26 10 PK STRL BLUE (TOWEL DISPOSABLE) ×2 IMPLANT
WATER STERILE IRR 1000ML POUR (IV SOLUTION) ×2 IMPLANT
versanail troch entry nail -left 11mm X 36cm ×1 IMPLANT

## 2011-06-19 NOTE — Brief Op Note (Signed)
06/18/2011 - 06/19/2011  3:09 AM  PATIENT:  [name]  78 y.o. female  PRE-OPERATIVE DIAGNOSIS:  Left Trochanteric Fracture  POST-OPERATIVE DIAGNOSIS:  Left Trochanteric Fracture  PROCEDURE:  Procedure(s) (LRB): INTRAMEDULLARY (IM) NAIL FEMORAL (Left)  SURGEON:  Surgeon(s) and Role:    * Harvie Junior, MD - Primary  PHYSICIAN ASSISTANT:   ASSISTANTS: bethune   ANESTHESIA:   general  EBL:  Total I/O In: -  Out: 650 [Urine:450; Blood:200]  BLOOD ADMINISTERED:none  DRAINS: none   LOCAL MEDICATIONS USED:  NONE  SPECIMEN:  No Specimen  DISPOSITION OF SPECIMEN:  N/A  COUNTS:  YES  TOURNIQUET:  * No tourniquets in log *  DICTATION: .Other Dictation: Dictation Number [id number]  PLAN OF CARE: Admit to inpatient   PATIENT DISPOSITION:  PACU - hemodynamically stable.   Delay start of Pharmacological VTE agent (>24hrs) due to surgical blood loss or risk of bleeding: not applicable

## 2011-06-19 NOTE — Anesthesia Procedure Notes (Signed)
Procedure Name: Intubation Date/Time: 06/19/2011 12:35 AM Performed by: Molli Hazard Pre-anesthesia Checklist: Patient identified, Emergency Drugs available, Suction available and Patient being monitored Patient Re-evaluated:Patient Re-evaluated prior to inductionOxygen Delivery Method: Circle system utilized Preoxygenation: Pre-oxygenation with 100% oxygen Intubation Type: IV induction Ventilation: Mask ventilation without difficulty Laryngoscope Size: Miller and 2 Grade View: Grade II Tube type: Oral Tube size: 7.5 mm Number of attempts: 1 Airway Equipment and Method: Stylet Placement Confirmation: ETT inserted through vocal cords under direct vision,  positive ETCO2 and breath sounds checked- equal and bilateral Secured at: 21 cm Tube secured with: Tape Dental Injury: Teeth and Oropharynx as per pre-operative assessment

## 2011-06-19 NOTE — Anesthesia Preprocedure Evaluation (Signed)
Anesthesia Evaluation  Patient identified by MRN, date of birth, ID band Patient awake    Reviewed: Allergy & Precautions, H&P , NPO status , Patient's Chart, lab work & pertinent test results  Airway Mallampati: I TM Distance: >3 FB Neck ROM: Full    Dental   Pulmonary    Pulmonary exam normal       Cardiovascular hypertension,     Neuro/Psych    GI/Hepatic GERD-  ,  Endo/Other    Renal/GU      Musculoskeletal  (+) Arthritis -,   Abdominal   Peds  Hematology   Anesthesia Other Findings   Reproductive/Obstetrics                           Anesthesia Physical Anesthesia Plan  ASA: II and Emergent  Anesthesia Plan: General   Post-op Pain Management:    Induction: Intravenous  Airway Management Planned: Oral ETT  Additional Equipment:   Intra-op Plan:   Post-operative Plan: Extubation in OR  Informed Consent: I have reviewed the patients History and Physical, chart, labs and discussed the procedure including the risks, benefits and alternatives for the proposed anesthesia with the patient or authorized representative who has indicated his/her understanding and acceptance.     Plan Discussed with: CRNA and Surgeon  Anesthesia Plan Comments:         Anesthesia Quick Evaluation

## 2011-06-19 NOTE — Progress Notes (Signed)
Physical Therapy Evaluation Patient Details Name: Joann Baker MRN: [id number] DOB: 1925-02-12 Today's Date: 06/19/2011  Problem List:  Patient Active Problem List  Diagnoses  . Femur fracture, left    Past Medical History:  Past Medical History  Diagnosis Date  . Hypertension   . DJD (degenerative joint disease)    Past Surgical History:  Past Surgical History  Procedure Date  . No past surgeries     PT Assessment/Plan/Recommendation PT Assessment Clinical Impression Statement: Pt presents with a  left introchanteric femur fx due to a fall. Pt s/p IM nail and is WBAT. Pt resides at [facility]. Pt is currently experiencing acute pain and dependencies in mobility affecting her independence. Pt would benefit from Skilled PT to improve mobility for return to [facility] at a skilled level of care with continued PT/OT. PT Recommendation/Assessment: Patient will need skilled PT in the acute care venue PT Problem List: Decreased strength;Decreased range of motion;Decreased activity tolerance;Decreased balance;Decreased mobility;Decreased knowledge of use of DME;Decreased safety awareness;Decreased knowledge of precautions PT Therapy Diagnosis : Difficulty walking;Acute pain PT Plan PT Frequency: Min 3X/week PT Treatment/Interventions: DME instruction;Gait training;Functional mobility training;Therapeutic activities;Therapeutic exercise;Balance training;Patient/family education PT Recommendation Follow Up Recommendations: Skilled nursing facility Equipment Recommended: None recommended by PT PT Goals  Acute Rehab PT Goals PT Goal Formulation: With patient Time For Goal Achievement: 2 weeks Pt will go Supine/Side to Sit: with mod assist PT Goal: Supine/Side to Sit - Progress: Goal set today Pt will go Sit to Stand: with min assist PT Goal: Sit to Stand - Progress: Goal set today Pt will go Stand to Sit: with min assist PT Goal: Stand to Sit - Progress: Goal set today Pt will  Ambulate: 16 - 50 feet;with min assist PT Goal: Ambulate - Progress: Goal set today Pt will Perform Home Exercise Program: with supervision, verbal cues required/provided PT Goal: Perform Home Exercise Program - Progress: Goal set today  PT Evaluation Precautions/Restrictions  Precautions Precautions: Fall Restrictions Weight Bearing Restrictions: Yes LLE Weight Bearing: Weight bearing as tolerated Prior Functioning  Home Living Lives With: Alone Receives Help From: Family Type of Home: Assisted living Home Layout: One level Home Adaptive Equipment: [place] - rolling Prior Function Level of Independence: Requires assistive device for independence Vocation: Retired Financial risk analyst Arousal/Alertness: Awake/alert Overall Cognitive Status: History of cognitive impairments History of Cognitive Impairment: Appears at baseline functioning Orientation Level: Oriented X4 Sensation/Coordination Sensation Light Touch: Appears Intact Extremity Assessment RLE Assessment RLE Assessment: Exceptions to [facility] RLE Strength RLE Overall Strength: Deficits RLE Overall Strength Comments: 3-4/5 previous R leg fx per patient LLE Assessment LLE Assessment: Not tested Mobility (including Balance) Bed Mobility Bed Mobility: Yes Supine to Sit: 1: +2 Total assist;Patient percentage (comment) Supine to Sit Details (indicate cue type and reason): 10%. pt very fearful. Sitting - Scoot to Edge of Bed: 2: Max assist Sitting - Scoot to [place] of Bed Details (indicate cue type and reason): max cues for technique Transfers Transfers: Yes Sit to Stand: 1: +2 Total assist;From bed;Other (comment) (pt=50%) Sit to Stand Details (indicate cue type and reason): cues for foot placement and to increase trunk flexion, +2 for safety Stand to Sit: 1: +2 Total assist;To chair/3-in-1;Other (comment) (pt=50%) Stand to Sit Details: cues for safety and technique Ambulation/Gait Ambulation/Gait: Yes Ambulation/Gait  Assistance: 1: +2 Total assist;Patient percentage (comment) (Pt=50%) Ambulation/Gait Assistance Details (indicate cue type and reason): max cues for sequencing and foot placement Ambulation Distance (Feet): 3 Feet Assistive device: Rolling Jablonski Gait  Pattern: Step-to pattern;Decreased stance time - left;Trunk flexed Gait velocity: decreased  Posture/Postural Control Posture/Postural Control: No significant limitations Balance Balance Assessed: Yes Static Sitting Balance Static Sitting - Balance Support: Bilateral upper extremity supported Static Sitting - Level of Assistance: 5: Stand by assistance Static Standing Balance Static Standing - Balance Support: Bilateral upper extremity supported Static Standing - Level of Assistance: 1: +2 Total assist;Patient percentage (comment) (50%) Exercise  Total Joint Exercises Ankle Circles/Pumps: AROM;Strengthening;Both;10 reps;Supine Quad Sets: AROM;Strengthening;Left;10 reps;Supine Heel Slides: AAROM;Strengthening;Left;10 reps;Supine End of Session PT - End of Session Equipment Utilized During Treatment: Gait belt Activity Tolerance: Patient limited by pain Patient left: in chair;with call bell in reach Nurse Communication: Mobility status for transfers General Behavior During Session: [facility] for tasks performed Cognition: [facility] for tasks performed  Greggory Stallion 06/19/2011, 11:02 AM

## 2011-06-19 NOTE — Anesthesia Postprocedure Evaluation (Signed)
  Anesthesia Post-op Note  Patient: Joann Baker  Procedure(s) Performed: Procedure(s) (LRB): INTRAMEDULLARY (IM) NAIL FEMORAL (Left)  Patient Location: PACU  Anesthesia Type: General  Level of Consciousness: awake  Airway and Oxygen Therapy: Patient Spontanous Breathing  Post-op Pain: mild  Post-op Assessment: Post-op Vital signs reviewed, Patient's Cardiovascular Status Stable, Respiratory Function Stable, Patent Airway and Pain level controlled  Post-op Vital Signs: stable  Complications: No apparent anesthesia complications

## 2011-06-19 NOTE — Op Note (Signed)
NAMEVIRGINA, DEAKINS NO.:  [id number]  MEDICAL RECORD NO.:  [id number]  LOCATION:  5149                         FACILITY:  [facility]  PHYSICIAN:  Harvie Junior, M.D.   DATE OF BIRTH:  Jul 09, 1924  DATE OF PROCEDURE:  06/19/2011 DATE OF DISCHARGE:                              OPERATIVE REPORT   PREOPERATIVE DIAGNOSIS:  Distal quarter femur fracture, left.  POSTOPERATIVE DIAGNOSIS:  Distal quarter femur fracture, left.  PROCEDURE:  Intramedullary rodding of left distal femur fracture with a Depuy trochanteric nail, size 11 x 37 cm, with a greater to lesser locking proximal and 2 distal interlocking screws.  SURGEON:  Harvie Junior, MD.  ASSISTANT:  Marshia Ly, PA.  ANESTHESIA:  General.  BRIEF HISTORY:  Ms. Eagles is an 78 year female with history had been walking across her floor, tripped, and fractured her femur.  She was initially evaluated and noted to have a femur fracture in the emergency room.  We were consulted for management.  She was brought to the operating room for this procedure.  DESCRIPTION OF PROCEDURE:  The patient was brought to the operating room.  After adequate anesthesia was obtained with general anesthetic, the patient placed supine on operating table.  The left leg was then prepped and draped in sterile fashion after placed in the fracture table and traction was applied.  Once this was completed, attention was turned towards the left hip, where after appropriate alignment had been identified, a small incision was made just proximal to the greater trochanter.  Subcutaneous tissue was dissected down the level of the greater trochanter and a guidewire was placed into the entry point. This was over-reamed with an introductory reamer and a guidewire was advanced distally across the fracture.  The fracture was then sequentially reamed up to a level of 12.5 and an 11-mm rod was then advanced across the fracture site, getting anatomic  reduction at the fracture site.  Once this was achieved, a greater to lesser locking screw was used proximally, which gave excellent fixation.  Attention was then turned distally where the distal interlocks were done in a freehand technique.  This gave [place] excellent purchase down distally.  Once this was completed, the wounds were completely irrigated, suctioned dry, and the wounds were then closed in layers.  The fluoroscopic images were taken and we had excellent anatomic reduction at the fracture site.  At this point, the wounds were closed in layers. Sterile compressive dressings were applied, and the patient taken to recovery room and noted to be in satisfactory condition.  Estimated blood loss for the procedure was 200 mL.     Harvie Junior, M.D.     Ranae Plumber  D:  06/19/2011  T:  06/19/2011  Job:  [id number]

## 2011-06-19 NOTE — Progress Notes (Signed)
Subjective: Day of Surgery Procedure(s) (LRB): INTRAMEDULLARY (IM) NAIL FEMORAL (Left) Patient reports pain as minimal. Some confusion  Probably near baseline level.   Objective: Vital signs in last 24 hours: Temp:  [97.2 F (36.2 C)-98.2 F (36.8 C)] 97.9 F (36.6 C) (03/01 0505) Pulse Rate:  [53-74] 61  (03/01 0505) Resp:  [12-28] 16  (03/01 0800) BP: (111-162)/(39-90) 130/53 mmHg (03/01 0505) SpO2:  [95 %-100 %] 99 % (03/01 0800) Weight:  [72.576 kg (160 lb)] 72.576 kg (160 lb) (03/01 0400)  Intake/Output from previous day: 02/28 0701 - 03/01 0700 In: 879 [I.V.:879] Out: 800 [Urine:600; Blood:200] Intake/Output this shift:     Basename 06/19/11 0530 06/18/11 2349 06/18/11 1831  HGB 10.6* 12.1 12.7    Basename 06/19/11 0530 06/18/11 2349  WBC 15.3* 14.4*  RBC 3.47* 3.91  HCT 31.6* 35.7*  PLT 190 205    Basename 06/19/11 0530 06/18/11 2349  NA 139 139  K 4.5 4.6  CL 102 102  CO2 26 25  BUN 20 18  CREATININE 1.04 1.01  GLUCOSE 152* 156*  CALCIUM 9.5 9.6    Basename 06/19/11 0530 06/18/11 2349  LABPT -- --  INR 1.10 1.00  Left leg exam:  Neurovascular intact Sensation intact distally Intact pulses distally Dorsiflexion/Plantar flexion intact Incision: dressing C/D/I  Assessment/Plan: Day of Surgery Procedure(s) (LRB): INTRAMEDULLARY (IM) NAIL FEMORAL (Left) Plan: Advance diet Up with therapy Coumadin DVT prophylaxis Back to [facility] in 2-3 days. Minimize narcotics to decrease confusion.   Cyndie Woodbeck G 06/19/2011, 8:16 AM

## 2011-06-19 NOTE — Preoperative (Signed)
Beta Blockers   Reason not to administer Beta Blockers:BB taken 2-28

## 2011-06-19 NOTE — Transfer of Care (Signed)
Immediate Anesthesia Transfer of Care Note  Patient: Joann Baker  Procedure(s) Performed: Procedure(s) (LRB): INTRAMEDULLARY (IM) NAIL FEMORAL (Left)  Patient Location: PACU  Anesthesia Type: General  Level of Consciousness: sedated  Airway & Oxygen Therapy: Patient connected to nasal cannula oxygen  Post-op Assessment: Report given to PACU RN and Post -op Vital signs reviewed and stable  Post vital signs: Reviewed and stable  Complications: No apparent anesthesia complications

## 2011-06-19 NOTE — Progress Notes (Signed)
ANTICOAGULATION CONSULT NOTE - Initial Consult  Pharmacy Consult for Coumadin Indication: VTE prophylaxis  Allergies  Allergen Reactions  . Asmanex     Per mar  . Flovent Diskus     Per mar  . Fosamax     Per mar  . Omeprazole     Per mar  . Symbicort     Per mar  . Valium (Diazepam)    Vital Signs: Temp: 97.9 F (36.6 C) (03/01 0431) Temp src: Oral (03/01 0431) BP: 132/50 mmHg (03/01 0431) Pulse Rate: 65  (03/01 0431)  Labs:  Basename 06/18/11 2349 06/18/11 1831  HGB 12.1 12.7  HCT 35.7* 37.0  PLT 205 200  APTT -- --  LABPROT 13.4 --  INR 1.00 --  HEPARINUNFRC -- --  CREATININE 1.01 1.06  CKTOTAL -- --  CKMB -- --  TROPONINI -- --   Medical History: Past Medical History  Diagnosis Date  . Hypertension   . DJD (degenerative joint disease)     Medications:  Prescriptions prior to admission  Medication Sig Dispense Refill  . amLODipine (NORVASC) 2.5 MG tablet Take 2.5 mg by mouth daily.      Marland Kitchen aspirin 81 MG chewable tablet Chew 81 mg by mouth daily.      Marland Kitchen atorvastatin (LIPITOR) 10 MG tablet Take 10 mg by mouth every evening.      . calcium-vitamin D (OSCAL WITH D) 500-200 MG-UNIT per tablet Take 1 tablet by mouth 3 (three) times daily.      . fish oil-omega-3 fatty acids 1000 MG capsule Take 1 g by mouth daily.      . isosorbide mononitrate (IMDUR) 60 MG 24 hr tablet Take 30 mg by mouth daily.      . memantine (NAMENDA) 10 MG tablet Take 10 mg by mouth 2 (two) times daily.      . metoprolol (LOPRESSOR) 50 MG tablet Take 50 mg by mouth daily.      . Multiple Vitamins-Minerals (CERTAVITE SENIOR/ANTIOXIDANT PO) Take 1 tablet by mouth daily.      Marland Kitchen omeprazole-sodium bicarbonate (ZEGERID) 40-1100 MG per capsule Take 1 capsule by mouth 2 (two) times daily.      . polyethylene glycol (MIRALAX / GLYCOLAX) packet Take 17 g by mouth daily.      . rivastigmine (EXELON) 4.6 mg/24hr Place 1 patch onto the skin daily.       Scheduled:    . amLODipine  2.5 mg Oral  Daily  . atorvastatin  10 mg Oral QPM  . calcium-vitamin D  1 tablet Oral TID WC  .  ceFAZolin (ANCEF) IV  1 g Intravenous Q6H  . docusate sodium  100 mg Oral BID  . ferrous sulfate  325 mg Oral BID WC  .  HYDROmorphone (DILAUDID) injection  1 mg Intravenous Once  .  HYDROmorphone (DILAUDID) injection  1 mg Intravenous Once  . isosorbide mononitrate  30 mg Oral Daily  . memantine  10 mg Oral BID  . metoprolol  50 mg Oral Daily  .  morphine injection  4 mg Intravenous Once  . ondansetron (ZOFRAN) IV  4 mg Intravenous Once  . pantoprazole  40 mg Oral Q1200  . polyethylene glycol  17 g Oral Daily  . rivastigmine  4.6 mg Transdermal Daily    Assessment: 78yo female to begin Coumadin for VTE Px s/p intramedullary femoral nail for femur fracture.  Goal of Therapy:  INR ~2   Plan:  Will give initial Coumadin dose of  5mg  po x1 at 1800 today and adjust dosing per daily INR.  Colleen Can  PharmD [facility] 06/19/2011,4:43 AM

## 2011-06-19 NOTE — Evaluation (Addendum)
Occupational Therapy Evaluation Patient Details Name: Joann Baker MRN: [id number] DOB: February 20, 1925 Today's Date: 06/19/2011 13:17-13:46  ev11 Problem List:  Patient Active Problem List  Diagnoses  . Femur fracture, left    Past Medical History:  Past Medical History  Diagnosis Date  . Hypertension   . DJD (degenerative joint disease)    Past Surgical History:  Past Surgical History  Procedure Date  . No past surgeries     OT Assessment/Plan/Recommendation OT Assessment Clinical Impression Statement: 70 four yr old female admitted after fall resutling in left femur fracture.  Pt underwent left femur IM nailing and now presents with increased pain, increased confusion, and increased overall dependence with all basic ADLs.  Feel pt will benefit from acute OT services to address these issues in order to increase pt's overall independence.  Feel pt will need extensive SNF level rehab in order to reach supervision/modified independent level for return to ALF.   OT Recommendation/Assessment: Patient will need skilled OT in the acute care venue OT Problem List: Decreased strength;Decreased activity tolerance;Impaired balance (sitting and/or standing);Decreased knowledge of use of DME or AE;Decreased safety awareness;Decreased cognition;Pain Barriers to Discharge: Decreased caregiver support OT Therapy Diagnosis : Generalized weakness;Acute pain OT Plan OT Frequency: Min 1X/week OT Treatment/Interventions: Self-care/ADL training;Therapeutic activities;DME and/or AE instruction;Balance training;Patient/family education OT Recommendation Follow Up Recommendations: Skilled nursing facility Equipment Recommended: Defer to next venue Individuals Consulted Consulted and Agree with Results and Recommendations: Patient OT Goals Acute Rehab OT Goals OT Goal Formulation: With patient Time For Goal Achievement: 2 weeks ADL Goals Pt Will Perform Lower Body Bathing: with mod assist;Sit to  stand from bed;with adaptive equipment ADL Goal: Lower Body Bathing - Progress: Goal set today Pt Will Perform Lower Body Dressing: with mod assist;Sit to stand from bed;with adaptive equipment ADL Goal: Lower Body Dressing - Progress: Goal set today Pt Will Transfer to Toilet: with mod assist;with DME;Stand pivot transfer ADL Goal: Toilet Transfer - Progress: Goal set today Pt Will Perform Toileting - Clothing Manipulation: with mod assist;Sitting on 3-in-1 or toilet;Standing ADL Goal: Toileting - Clothing Manipulation - Progress: Goal set today Pt Will Perform Toileting - Hygiene: with mod assist;Sit to stand from 3-in-1/toilet ADL Goal: Toileting - Hygiene - Progress: Goal set today  OT Evaluation Precautions/Restrictions  Precautions Precautions: Fall Required Braces or Orthoses: No Restrictions Weight Bearing Restrictions: No LLE Weight Bearing: Weight bearing as tolerated Prior Functioning Home Living Lives With: Alone Receives Help From: Family Type of Home: Assisted living Home Layout: One level Home Adaptive Equipment: [place] - rolling Additional Comments: Pt unable to give accurate information during OT evaluation. Prior Function Level of Independence: Requires assistive device for independence;Independent with basic ADLs Driving: No Vocation: Retired ADL ADL Eating/Feeding: Simulated;Set up Where Assessed - Eating/Feeding: Chair Grooming: Simulated;Set up Where Assessed - Grooming: Sitting, chair Upper Body Bathing: Simulated;Set up Where Assessed - Upper Body Bathing: Sitting, chair Lower Body Bathing: Simulated;+2 Total assistance Lower Body Bathing Details (indicate cue type and reason): pt 20 % Where Assessed - Lower Body Bathing: Sit to stand from chair Upper Body Dressing: Simulated;Set up Where Assessed - Upper Body Dressing: Sitting, chair Lower Body Dressing: +2 Total assistance Lower Body Dressing Details (indicate cue type and reason): pt 20% Where  Assessed - Lower Body Dressing: Sit to stand from chair Toilet Transfer: Simulated;+2 Total assistance Toilet Transfer Details (indicate cue type and reason): pt 20% Toilet Transfer Method: Stand pivot Toileting - Clothing Manipulation: Simulated;+2 Total assistance Toileting - Clothing Manipulation  Details (indicate cue type and reason): pt 20% Where Assessed - Toileting Clothing Manipulation: Sit to stand from 3-in-1 or toilet Toileting - Hygiene: +2 Total assistance Toileting - Hygiene Details (indicate cue type and reason): pt 20% Where Assessed - Toileting Hygiene: Sit to stand from 3-in-1 or toilet Tub/Shower Transfer: Not assessed Tub/Shower Transfer Method: Not assessed Ambulation Related to ADLs: Pt unable to ambulate at this time. ADL Comments: Pt confused not aware that she was at the hospital.  Unreliable with PLOF information.  Pt reistant to movement secondary to pain in RLE. Vision/Perception  Vision - History Baseline Vision: No visual deficits Patient Visual Report: No change from baseline Perception Perception: Within Functional Limits Praxis Praxis: Intact Cognition Cognition Arousal/Alertness: Awake/alert Overall Cognitive Status: History of cognitive impairments History of Cognitive Impairment: Appears at baseline functioning Orientation Level: Oriented to person;Disoriented to time;Disoriented to place Sensation/Coordination Sensation Light Touch: Appears Intact Stereognosis: Not tested Hot/Cold: Not tested Proprioception: Not tested Coordination Gross Motor Movements are Fluid and Coordinated: Yes Fine Motor Movements are Fluid and Coordinated: Yes Extremity Assessment RUE Assessment RUE Assessment: Within Functional Limits LUE Assessment LUE Assessment: Within Functional Limits Mobility  Bed Mobility Bed Mobility: Yes Sit to Supine: 2: Max assist Sit to Supine - Details (indicate cue type and reason): Pt required assist to lift her LEs back in bed  and to bridge to the center of the bed. Transfers Transfers: Yes Sit to Stand: With upper extremity assist;1: +2 Total assist;With armrests;From chair/3-in-1 Sit to Stand Details (indicate cue type and reason): total +2 pt 20%  End of Session OT - End of Session Activity Tolerance: Patient limited by pain Patient left: with call bell in reach;in bed General Behavior During Session: [facility] for tasks performed Cognition: Impaired, at baseline   Karo Rog OTR/L 06/19/2011, 3:16 PM  Pager number [phone]

## 2011-06-20 LAB — BASIC METABOLIC PANEL
CO2: 27 mEq/L (ref 19–32)
GFR calc non Af Amer: 44 mL/min — ABNORMAL LOW (ref >90)
Glucose, Bld: 112 mg/dL — ABNORMAL HIGH (ref 70–99)
Potassium: 3.9 mEq/L (ref 3.5–5.1)
Sodium: 131 mEq/L — ABNORMAL LOW (ref 135–145)

## 2011-06-20 LAB — CBC
Hemoglobin: 9.1 g/dL — ABNORMAL LOW (ref 12.0–15.0)
RBC: 2.94 MIL/uL — ABNORMAL LOW (ref 3.87–5.11)

## 2011-06-20 LAB — PROTIME-INR: INR: 1.18 (ref 0.00–1.49)

## 2011-06-20 MED ORDER — WARFARIN SODIUM 5 MG PO TABS
5.0000 mg | ORAL_TABLET | Freq: Once | ORAL | Status: AC
  Administered 2011-06-20: 5 mg via ORAL
  Filled 2011-06-20: qty 1

## 2011-06-20 MED ORDER — SODIUM CHLORIDE 0.9 % IV BOLUS (SEPSIS)
500.0000 mL | Freq: Once | INTRAVENOUS | Status: AC
  Administered 2011-06-20: 500 mL via INTRAVENOUS

## 2011-06-20 NOTE — Progress Notes (Signed)
Occupational Therapy Treatment Patient Details Name: Joann Baker MRN: [id number] DOB: 07-20-1924 Today's Date: 06/20/2011  OT Assessment/Plan OT Assessment/Plan Comments on Treatment Session: Pt limited by pain and anxiety about moving.  Requires maximum encouragement and verbal cues for transfer. OT Plan: Discharge plan remains appropriate OT Frequency: Min 1X/week Follow Up Recommendations: Skilled nursing facility Equipment Recommended: Defer to next venue OT Goals Acute Rehab OT Goals OT Goal Formulation: With patient Time For Goal Achievement: 2 weeks ADL Goals Pt Will Transfer to Toilet: with mod assist;with DME;Stand pivot transfer ADL Goal: Toilet Transfer - Progress: Progressing toward goals  OT Treatment Precautions/Restrictions  Precautions Precautions: Fall Required Braces or Orthoses: No Restrictions Weight Bearing Restrictions: Yes LLE Weight Bearing: Weight bearing as tolerated   ADL ADL Toilet Transfer: Simulated;+2 Total assistance;Comment for patient % (50) Toilet Transfer Details (indicate cue type and reason): chair back to bed with RN Toilet Transfer Method: Stand pivot (with RW) Equipment Used: Rolling Rambert ADL Comments: Pt with anxiety with transfer. Mobility  Bed Mobility Bed Mobility: Yes Sit to Supine: 1: +2 Total assist;Patient percentage (comment) (10) Transfers Transfers: Yes Sit to Stand: With upper extremity assist;From chair/3-in-1;1: +2 Total assist (40) Sit to Stand Details (indicate cue type and reason): total assist to scoot hips to edge of chair Stand to Sit: 1: +2 Total assist;To bed;With upper extremity assist (60) Stand to Sit Details: cues for hand placement, to back up until she felt bed on legs  End of Session OT - End of Session Equipment Utilized During Treatment: Gait belt Activity Tolerance: Patient limited by pain Patient left: with family/visitor present;in bed;with call bell in reach Nurse Communication: Mobility  status for transfers General Behavior During Session: [facility] for tasks performed Cognition: Impaired, at baseline  Evern Bio  06/20/2011, 1:32 PM [phone]

## 2011-06-20 NOTE — Progress Notes (Signed)
Subjective: 1 Day Post-Op Procedure(s) (LRB): INTRAMEDULLARY (IM) NAIL FEMORAL (Left) Patient reports pain as 3 on 0-10 scale.    Objective: Vital signs in last 24 hours: Temp:  [97.6 F (36.4 C)-98.1 F (36.7 C)] 97.8 F (36.6 C) (03/02 0536) Pulse Rate:  [57-70] 65  (03/02 0536) Resp:  [16-18] 16  (03/02 0536) BP: (98-131)/(49-62) 98/53 mmHg (03/02 0536) SpO2:  [92 %-99 %] 96 % (03/02 0536)  Intake/Output from previous day: 03/01 0701 - 03/02 0700 In: 2097 [P.O.:360; I.V.:1687; IV Piggyback:50] Out: 625 [Urine:625] Intake/Output this shift:     Basename 06/20/11 0610 06/19/11 0530 06/18/11 2349 06/18/11 1831  HGB 9.1* 10.6* 12.1 12.7    Basename 06/20/11 0610 06/19/11 0530  WBC 11.3* 15.3*  RBC 2.94* 3.47*  HCT 26.9* 31.6*  PLT 147* 190    Basename 06/20/11 0610 06/19/11 0530  NA 131* 139  K 3.9 4.5  CL 95* 102  CO2 27 26  BUN 28* 20  CREATININE 1.12* 1.04  GLUCOSE 112* 152*  CALCIUM 9.0 9.5    Basename 06/20/11 0610 06/19/11 0530  LABPT -- --  INR 1.18 1.10    Neurologically intact ABD soft Neurovascular intact Sensation intact distally Intact pulses distally No cellulitis present Compartment soft  Assessment/Plan: 1 Day Post-Op Procedure(s) (LRB): INTRAMEDULLARY (IM) NAIL FEMORAL (Left) Advance diet Up with therapy Discharge to SNF monday  Erina Hamme L 06/20/2011, 9:35 AM

## 2011-06-20 NOTE — Progress Notes (Signed)
Called and spoke with [name], PA regarding pt's BP (had held BP meds this am), poor po intake and BUN/Cr elevated, received order to give IV bolus of 500 cc NS and continue IVF's at 50cc/hr. Will continue to monitor urine output and encourage PO fluids and will continue foley for output monitoring.

## 2011-06-20 NOTE — Progress Notes (Signed)
ANTICOAGULATION CONSULT NOTE - Initial Consult  Pharmacy Consult for Coumadin Indication: VTE prophylaxis  Allergies  Allergen Reactions  . Asmanex     Per mar  . Flovent Diskus     Per mar  . Fosamax     Per mar  . Omeprazole     Per mar  . Symbicort     Per mar  . Valium (Diazepam)    Vital Signs: Temp: 98.4 F (36.9 C) (03/02 1000) Temp src: Oral (03/02 1000) BP: 120/44 mmHg (03/02 1131) Pulse Rate: 64  (03/02 1000)  Labs:  Basename 06/20/11 0610 06/19/11 0530 06/18/11 2349  HGB 9.1* 10.6* --  HCT 26.9* 31.6* 35.7*  PLT 147* 190 205  APTT -- -- --  LABPROT 15.2 14.4 13.4  INR 1.18 1.10 1.00  HEPARINUNFRC -- -- --  CREATININE 1.12* 1.04 1.01  CKTOTAL -- -- --  CKMB -- -- --  TROPONINI -- -- --   Medical History: Past Medical History  Diagnosis Date  . Hypertension   . DJD (degenerative joint disease)     Medications:  Prescriptions prior to admission  Medication Sig Dispense Refill  . amLODipine (NORVASC) 2.5 MG tablet Take 2.5 mg by mouth daily.      Marland Kitchen aspirin 81 MG chewable tablet Chew 81 mg by mouth daily.      Marland Kitchen atorvastatin (LIPITOR) 10 MG tablet Take 10 mg by mouth every evening.      . calcium-vitamin D (OSCAL WITH D) 500-200 MG-UNIT per tablet Take 1 tablet by mouth 3 (three) times daily.      . fish oil-omega-3 fatty acids 1000 MG capsule Take 1 g by mouth daily.      . isosorbide mononitrate (IMDUR) 60 MG 24 hr tablet Take 30 mg by mouth daily.      . memantine (NAMENDA) 10 MG tablet Take 10 mg by mouth 2 (two) times daily.      . metoprolol (LOPRESSOR) 50 MG tablet Take 50 mg by mouth daily.      . Multiple Vitamins-Minerals (CERTAVITE SENIOR/ANTIOXIDANT PO) Take 1 tablet by mouth daily.      Marland Kitchen omeprazole-sodium bicarbonate (ZEGERID) 40-1100 MG per capsule Take 1 capsule by mouth 2 (two) times daily.      . polyethylene glycol (MIRALAX / GLYCOLAX) packet Take 17 g by mouth daily.      . rivastigmine (EXELON) 4.6 mg/24hr Place 1 patch onto the  skin daily.       Scheduled:     . amLODipine  2.5 mg Oral Daily  . atorvastatin  10 mg Oral QPM  . calcium-vitamin D  1 tablet Oral TID WC  .  ceFAZolin (ANCEF) IV  1 g Intravenous Q6H  . coumadin book   Does not apply Once  . docusate sodium  100 mg Oral BID  . ferrous sulfate  325 mg Oral BID WC  . isosorbide mononitrate  30 mg Oral Daily  . memantine  10 mg Oral BID  . metoprolol  50 mg Oral Daily  . pantoprazole  40 mg Oral Q1200  . polyethylene glycol  17 g Oral Daily  . rivastigmine  4.6 mg Transdermal Daily  . warfarin  5 mg Oral ONCE-1800  . warfarin   Does not apply Once  . Warfarin - Pharmacist Dosing Inpatient   Does not apply q1800    Assessment: 78yo female to begin Coumadin for VTE Px s/p intramedullary femoral nail for femur fracture. todays INR 1.18 after initial  dose of 5 mg  Goal of Therapy:  INR ~2   Plan:  Will give initial Coumadin dose of 5mg  po x1 at 1800 today and adjust dosing per daily INR.  Lucille Passy  PharmD BCPS 06/20/2011,1:32 PM

## 2011-06-20 NOTE — Progress Notes (Signed)
BP is 130/39 automatic R arm, 130/50 manual R arm, 120/44 manual L arm.  Held BP meds this am.  Will continue to monitor BP.  Pt encouraged to drink fluids.  Dr. Luiz Blare paged to inform.

## 2011-06-21 LAB — BASIC METABOLIC PANEL
BUN: 16 mg/dL (ref 6–23)
Chloride: 102 mEq/L (ref 96–112)
GFR calc Af Amer: 87 mL/min — ABNORMAL LOW (ref >90)
Potassium: 4 mEq/L (ref 3.5–5.1)

## 2011-06-21 LAB — CBC
HCT: 26.7 % — ABNORMAL LOW (ref 36.0–46.0)
Hemoglobin: 9.3 g/dL — ABNORMAL LOW (ref 12.0–15.0)
RDW: 13.2 % (ref 11.5–15.5)
WBC: 10.2 10*3/uL (ref 4.0–10.5)

## 2011-06-21 NOTE — Progress Notes (Addendum)
Subjective: 2 Days Post-Op Procedure(s) (LRB): INTRAMEDULLARY (IM) NAIL FEMORAL (Left) Patient reports pain as 3 on 0-10 scale.   Taking po ok/voiding ok  Objective: Vital signs in last 24 hours: Temp:  [98.5 F (36.9 C)-98.9 F (37.2 C)] 98.7 F (37.1 C) (03/03 0626) Pulse Rate:  [71-81] 75  (03/03 0626) Resp:  [18] 18  (03/03 0753) BP: (135-163)/(47-53) 153/53 mmHg (03/03 0626) SpO2:  [94 %-98 %] 95 % (03/03 0626)  Intake/Output from previous day: 03/02 0701 - 03/03 0700 In: 1838 [P.O.:200; I.V.:1638] Out: 3000 [Urine:3000] Intake/Output this shift: Total I/O In: 200 [P.O.:200] Out: 350 [Urine:350]   Basename 06/21/11 0542 06/20/11 0610 06/19/11 0530 06/18/11 2349 06/18/11 1831  HGB 9.3* 9.1* 10.6* 12.1 12.7    Basename 06/21/11 0542 06/20/11 0610  WBC 10.2 11.3*  RBC 2.96* 2.94*  HCT 26.7* 26.9*  PLT 154 147*    Basename 06/21/11 0542 06/20/11 0610  NA 136 131*  K 4.0 3.9  CL 102 95*  CO2 28 27  BUN 16 28*  CREATININE 0.77 1.12*  GLUCOSE 120* 112*  CALCIUM 9.4 9.0    Basename 06/21/11 0542 06/20/11 0610  LABPT -- --  INR 1.96* 1.18  Left leg exam:  Neurovascular intact Sensation intact distally Intact pulses distally Dorsiflexion/Plantar flexion intact Incision: dressing C/D/I  Assessment/Plan: 2 Days Post-Op Procedure(s) (LRB): INTRAMEDULLARY (IM) NAIL FEMORAL (Left) PLAN: Discharge to SNF when bed available. Cont po coumadin. Cont PT.  Marshia Ly G 06/21/2011, 12:06 PM

## 2011-06-21 NOTE — Progress Notes (Signed)
ANTICOAGULATION CONSULT NOTE - Follow Up Consult  Pharmacy Consult for Coumadin Indication: VTE prophylaxis  Allergies  Allergen Reactions  . Asmanex     Per mar  . Flovent Diskus     Per mar  . Fosamax     Per mar  . Omeprazole     Per mar  . Symbicort     Per mar  . Valium (Diazepam)     Patient Measurements: Height: 5\' 2"  (157.5 cm) Weight: 160 lb (72.576 kg) IBW/kg (Calculated) : 50.1   Vital Signs: Temp: 98.7 F (37.1 C) (03/03 0626) Temp src: Oral (03/02 2219) BP: 153/53 mmHg (03/03 0626) Pulse Rate: 75  (03/03 0626)  Labs:  Basename 06/21/11 0542 06/20/11 0610 06/19/11 0530  HGB 9.3* 9.1* --  HCT 26.7* 26.9* 31.6*  PLT 154 147* 190  APTT -- -- --  LABPROT 22.7* 15.2 14.4  INR 1.96* 1.18 1.10  HEPARINUNFRC -- -- --  CREATININE 0.77 1.12* 1.04  CKTOTAL -- -- --  CKMB -- -- --  TROPONINI -- -- --   Estimated Creatinine Clearance: 48.8 ml/min (by C-G formula based on Cr of 0.77).   Medications:  Scheduled:    . amLODipine  2.5 mg Oral Daily  . atorvastatin  10 mg Oral QPM  . calcium-vitamin D  1 tablet Oral TID WC  . docusate sodium  100 mg Oral BID  . ferrous sulfate  325 mg Oral BID WC  . isosorbide mononitrate  30 mg Oral Daily  . memantine  10 mg Oral BID  . metoprolol  50 mg Oral Daily  . pantoprazole  40 mg Oral Q1200  . polyethylene glycol  17 g Oral Daily  . rivastigmine  4.6 mg Transdermal Daily  . sodium chloride  500 mL Intravenous Once  . warfarin  5 mg Oral ONCE-1800  . Warfarin - Pharmacist Dosing Inpatient   Does not apply q1800    Assessment: 78yo female s/p IM femoral nail due to femur fx, on Coumadin for VTE px.  There was a large jump in INR to 1.96 this AM, after 2nd dose of Coumadin.    Goal of Therapy:  INR ~2 per MD   Plan:  1.  No Coumadin today 2.  F/U in AM  Render Marley P 06/21/2011,8:44 AM

## 2011-06-21 NOTE — ED Provider Notes (Signed)
I saw and evaluated the patient, reviewed the resident's note and I agree with the findings and plan.  Pt twisted and had midshift fracture of femur.  Pt with intact distal pulse and nerve function grossly.  Dr. Luiz Blare with ortho to see and admit.  Traction applied by him for comfort measures.  Hemodynamically intact.  No h/o syncope, no CP, ASA class 1 or 2.  Cleared for surgery in my opinion.    Gavin Pound. Oletta Lamas, MD 06/21/11 2134

## 2011-06-21 NOTE — Progress Notes (Signed)
AM Imdur & Metoprolol held due to low DBP.  [name] notified of HS BP 163/53  HR 81.  No orders at this time.

## 2011-06-21 NOTE — Progress Notes (Signed)
Spoke with son and updated him on plan of care.  Pt lives at the asst living part of [facility] and they are anticipating that the skilled nursing facility part of [facility] will be having a bed open on Tuesday.  They would like for her to go there if at all possible.  Order for social work was already placed by [name], PA.

## 2011-06-22 LAB — BASIC METABOLIC PANEL
CO2: 27 mEq/L (ref 19–32)
Chloride: 98 mEq/L (ref 96–112)
Creatinine, Ser: 0.72 mg/dL (ref 0.50–1.10)
Potassium: 4.3 mEq/L (ref 3.5–5.1)
Sodium: 137 mEq/L (ref 135–145)

## 2011-06-22 LAB — CBC
MCV: 89.3 fL (ref 78.0–100.0)
Platelets: 182 10*3/uL (ref 150–400)
RBC: 2.89 MIL/uL — ABNORMAL LOW (ref 3.87–5.11)
WBC: 11.3 10*3/uL — ABNORMAL HIGH (ref 4.0–10.5)

## 2011-06-22 LAB — PROTIME-INR: Prothrombin Time: 32 seconds — ABNORMAL HIGH (ref 11.6–15.2)

## 2011-06-22 NOTE — Progress Notes (Signed)
Pt. Frequently incontinent. Difficult to keep incision dry. [name] called. Order for foley received.Joann Baker 06/22/2011

## 2011-06-22 NOTE — Progress Notes (Signed)
Subjective: 3 Days Post-Op Procedure(s) (LRB): INTRAMEDULLARY (IM) NAIL FEMORAL (Left) Patient reports pain as minimal.  Some confusion.    Objective: Vital signs in last 24 hours: Temp:  [97.8 F (36.6 C)-98.6 F (37 C)] 98.2 F (36.8 C) (03/04 0615) Pulse Rate:  [62-77] 77  (03/04 0615) Resp:  [18] 18  (03/04 0615) BP: (112-138)/(48-66) 133/56 mmHg (03/04 0615) SpO2:  [95 %-96 %] 96 % (03/04 0615)  Intake/Output from previous day: 03/03 0701 - 03/04 0700 In: 660 [P.O.:660] Out: 1300 [Urine:1300] Intake/Output this shift:     Basename 06/22/11 0713 06/21/11 0542 06/20/11 0610  HGB 8.9* 9.3* 9.1*    Basename 06/22/11 0713 06/21/11 0542  WBC 11.3* 10.2  RBC 2.89* 2.96*  HCT 25.8* 26.7*  PLT 182 154    Basename 06/22/11 0713 06/21/11 0542  NA 137 136  K 4.3 4.0  CL 98 102  CO2 27 28  BUN 17 16  CREATININE 0.72 0.77  GLUCOSE 97 120*  CALCIUM 9.1 9.4    Basename 06/22/11 0713 06/21/11 0542  LABPT -- --  INR 3.05* 1.96*   Left leg: Neurovascular intact Sensation intact distally Intact pulses distally Dorsiflexion/Plantar flexion intact Incision: dressing C/D/I  Assessment/Plan: 3 Days Post-Op Procedure(s) (LRB): INTRAMEDULLARY (IM) NAIL FEMORAL (Left) Plan: Up with therapy Discharge [facility] SNF [facility].  Joann Baker G 06/22/2011, 11:21 AM

## 2011-06-22 NOTE — Progress Notes (Signed)
Clinical Social Work-CSW received word that [facility] unable [facility] offer spot for rehab. CSW provided pt daughter with bed offers-pt family is to tour SNFs and provide choice in preparation for d/c tomorrow. CSW following-Texanna Hilburn-MSW, [phone]

## 2011-06-22 NOTE — Progress Notes (Signed)
Physical Therapy Treatment Patient Details Name: Joann Baker MRN: [id number] DOB: 11/20/1924 Today's Date: 06/22/2011  PT Assessment/Plan  PT - Assessment/Plan Comments on Treatment Session: Pt anxious about mobility but agreeable to OOB to chair.  PT Plan: Discharge plan remains appropriate PT Frequency: Min 3X/week Follow Up Recommendations: Skilled nursing facility Equipment Recommended: Defer to next venue PT Goals  Acute Rehab PT Goals PT Goal: Supine/Side to Sit - Progress: Progressing toward goal PT Goal: Sit to Stand - Progress: Progressing toward goal PT Goal: Stand to Sit - Progress: Progressing toward goal PT Goal: Ambulate - Progress: Progressing toward goal PT Goal: Perform Home Exercise Program - Progress: Progressing toward goal  PT Treatment Precautions/Restrictions  Precautions Precautions: Fall Required Braces or Orthoses: No Restrictions Weight Bearing Restrictions: Yes LLE Weight Bearing: Weight bearing as tolerated Mobility (including Balance) Bed Mobility Supine to Sit: 1: +2 Total assist;Patient percentage (comment);With rails (p=50%) Supine to Sit Details (indicate cue type and reason): A for positioning of B LEs and to lift shoulders up out of bed.  Sitting - Scoot to Edge of Bed: 4: Min assist Sitting - Scoot to [place] of Bed Details (indicate cue type and reason): A for technique Transfers Sit to Stand: From chair/3-in-1;With upper extremity assist;2: Max assist Sit to Stand Details (indicate cue type and reason): A to initiate stand and to ensure balance. Cues for safe technique and hand placement. Stand to Sit: 3: Mod assist;To chair/3-in-1 Stand to Sit Details: A to control descent into recliner and cues for safe technique Ambulation/Gait Ambulation/Gait Assistance: 1: +2 Total assist;Patient percentage (comment) (p=60%) Ambulation/Gait Assistance Details (indicate cue type and reason): A for RW and advancement of LEs. Cues for postioning and gait  sequence Ambulation Distance (Feet): 4 Feet Assistive device: Rolling Swiech Gait Pattern: Step-to pattern;Decreased step length - right;Decreased step length - left;Trunk flexed;Antalgic    Exercise  General Exercises - Lower Extremity Ankle Circles/Pumps: AROM;Both;10 reps Quad Sets: AROM;Left;10 reps Heel Slides: AAROM;Left;10 reps End of Session PT - End of Session Equipment Utilized During Treatment: Gait belt Activity Tolerance: Patient tolerated treatment well Patient left: in chair Nurse Communication: Mobility status for transfers;Mobility status for ambulation General Behavior During Session: [name] for tasks performed Cognition: [name] for tasks performed  [name], Adline Potter 06/22/2011, 11:15 AM 06/22/2011 Fredrich Birks PTA [phone] pager [phone] office

## 2011-06-22 NOTE — Progress Notes (Signed)
Clinical Social Work-Full assessment in shadow chart-pt a resident [facility] will not have a bed available at [facility] SNF level for d/c-[facility] is attempting to create spot for [facility]-CSW awaiting return call and pursuing other options for d/c-Sayuri Rhames-MSW, [phone]

## 2011-06-22 NOTE — Progress Notes (Signed)
ANTICOAGULATION CONSULT NOTE - Follow Up Consult  Pharmacy Consult for Coumadin Indication: VTE prophylaxis  Allergies  Allergen Reactions  . Asmanex     Per mar  . Flovent Diskus     Per mar  . Fosamax     Per mar  . Omeprazole     Per mar  . Symbicort     Per mar  . Valium (Diazepam)     Patient Measurements: Height: 5\' 2"  (157.5 cm) Weight: 160 lb (72.576 kg) IBW/kg (Calculated) : 50.1   Vital Signs: Temp: 98.2 F (36.8 C) (03/04 0615) Temp src: Oral (03/03 2251) BP: 133/56 mmHg (03/04 0615) Pulse Rate: 77  (03/04 0615)  Labs:  Basename 06/22/11 0713 06/21/11 0542 06/20/11 0610  HGB 8.9* 9.3* --  HCT 25.8* 26.7* 26.9*  PLT 182 154 147*  APTT -- -- --  LABPROT 32.0* 22.7* 15.2  INR 3.05* 1.96* 1.18  HEPARINUNFRC -- -- --  CREATININE 0.72 0.77 1.12*  CKTOTAL -- -- --  CKMB -- -- --  TROPONINI -- -- --   Estimated Creatinine Clearance: 48.8 ml/min (by C-G formula based on Cr of 0.72).   Medications:  Scheduled:     . amLODipine  2.5 mg Oral Daily  . atorvastatin  10 mg Oral QPM  . calcium-vitamin D  1 tablet Oral TID WC  . docusate sodium  100 mg Oral BID  . ferrous sulfate  325 mg Oral BID WC  . isosorbide mononitrate  30 mg Oral Daily  . memantine  10 mg Oral BID  . metoprolol  50 mg Oral Daily  . pantoprazole  40 mg Oral Q1200  . polyethylene glycol  17 g Oral Daily  . rivastigmine  4.6 mg Transdermal Daily  . Warfarin - Pharmacist Dosing Inpatient   Does not apply q1800    Assessment: 78yo female s/p IM femoral nail due to femur fx, on Coumadin for VTE px.  There was a large jump in INR to 1.96 yesterday, now INR up to 3.05  Goal of Therapy:  INR ~2 per MD   Plan:  1.  No Coumadin today 2.  F/U in AM  Elwin Sleight 06/22/2011,9:59 AM

## 2011-06-23 DIAGNOSIS — R3981 Functional urinary incontinence: Secondary | ICD-10-CM | POA: Diagnosis present

## 2011-06-23 DIAGNOSIS — F0390 Unspecified dementia without behavioral disturbance: Secondary | ICD-10-CM | POA: Diagnosis present

## 2011-06-23 LAB — PROTIME-INR: INR: 2.91 — ABNORMAL HIGH (ref 0.00–1.49)

## 2011-06-23 LAB — CBC
HCT: 26.8 % — ABNORMAL LOW (ref 36.0–46.0)
Hemoglobin: 9.2 g/dL — ABNORMAL LOW (ref 12.0–15.0)
MCH: 31.1 pg (ref 26.0–34.0)
MCHC: 34.3 g/dL (ref 30.0–36.0)
MCV: 90.5 fL (ref 78.0–100.0)
RBC: 2.96 MIL/uL — ABNORMAL LOW (ref 3.87–5.11)

## 2011-06-23 LAB — BASIC METABOLIC PANEL
BUN: 19 mg/dL (ref 6–23)
CO2: 29 mEq/L (ref 19–32)
Calcium: 9 mg/dL (ref 8.4–10.5)
Glucose, Bld: 88 mg/dL (ref 70–99)
Sodium: 138 mEq/L (ref 135–145)

## 2011-06-23 MED ORDER — WARFARIN SODIUM 1 MG PO TABS
1.0000 mg | ORAL_TABLET | Freq: Once | ORAL | Status: DC
  Filled 2011-06-23: qty 1

## 2011-06-23 MED ORDER — TRAMADOL HCL 50 MG PO TABS: 50.0000 mg | ORAL_TABLET | Freq: Four times a day (QID) | ORAL | Status: AC | PRN

## 2011-06-23 MED ORDER — WARFARIN SODIUM 1 MG PO TABS: 1.0000 mg | ORAL_TABLET | Freq: Once | ORAL | Status: DC

## 2011-06-23 MED ORDER — METHOCARBAMOL 500 MG PO TABS: 500.0000 mg | ORAL_TABLET | Freq: Four times a day (QID) | ORAL | Status: AC | PRN

## 2011-06-23 NOTE — Progress Notes (Signed)
Patient discharged via EMS to [facility]. Instructions sent via EMS to SNF. No IV at this time.

## 2011-06-23 NOTE — Progress Notes (Signed)
Subjective: 4 Days Post-Op Procedure(s) (LRB): INTRAMEDULLARY (IM) NAIL FEMORAL (Left) Patient reports pain as 3 on 0-10 scale.   Incontinent with urine yest. Foley placed.  Objective: Vital signs in last 24 hours: Temp:  [97.7 F (36.5 C)-98.2 F (36.8 C)] 98.2 F (36.8 C) (03/05 0658) Pulse Rate:  [55-68] 68  (03/05 0658) Resp:  [17-18] 17  (03/05 0658) BP: (126-143)/(44-56) 143/56 mmHg (03/05 0658) SpO2:  [96 %-97 %] 96 % (03/05 0658)  Intake/Output from previous day: 03/04 0701 - 03/05 0700 In: 120 [P.O.:120] Out: 2350 [Urine:2350] Intake/Output this shift: Total I/O In: 240 [P.O.:240] Out: 550 [Urine:550]   Basename 06/23/11 0523 06/22/11 0713 06/21/11 0542  HGB 9.2* 8.9* 9.3*    Basename 06/23/11 0523 06/22/11 0713  WBC 9.3 11.3*  RBC 2.96* 2.89*  HCT 26.8* 25.8*  PLT 213 182    Basename 06/23/11 0523 06/22/11 0713  NA 138 137  K 4.2 4.3  CL 104 98  CO2 29 27  BUN 19 17  CREATININE 0.81 0.72  GLUCOSE 88 97  CALCIUM 9.0 9.1    Basename 06/23/11 0523 06/22/11 0713  LABPT -- --  INR 2.91* 3.05*  Left leg:  Neurovascular intact Sensation intact distally Intact pulses distally Dorsiflexion/Plantar flexion intact Incision: dressing C/D/I Compartment soft  Assessment/Plan: 4 Days Post-Op Procedure(s) (LRB): INTRAMEDULLARY (IM) NAIL FEMORAL (Left) Plan: Discharge to SNF  Joann Baker G 06/23/2011, 9:55 AM

## 2011-06-23 NOTE — Discharge Summary (Signed)
Patient ID: Joann Baker MRN: [id number] DOB/AGE: [birth year] 78 y.o.  Admit date: 06/18/2011 Discharge date: 06/23/2011  Admission Diagnoses:  Principal Problem:  *Femur fracture, left Active Problems:  Urinary incontinence due to cognitive impairment  Dementia   Discharge Diagnoses:  Same  Past Medical History  Diagnosis Date  . Hypertension   . DJD (degenerative joint disease)     Surgeries: Procedure(s):left INTRAMEDULLARY (IM) NAIL FEMORAL on 06/18/2011 - 06/19/2011  Discharged Condition: Improved  Hospital Course: Joann Baker is an 78 y.o. female who was admitted 06/18/2011 for operative treatment ofFemur fracture, left. Patient has severe unremitting pain that affects sleep, daily activities, and work/hobbies. After pre-op clearance the patient was taken to the operating room on 06/18/2011 - 06/19/2011 and underwent  Procedure(s):left INTRAMEDULLARY (IM) NAIL FEMORAL.    Patient was given perioperative antibiotics: Anti-infectives     Start     Dose/Rate Route Frequency Ordered Stop   06/19/11 0800   ceFAZolin (ANCEF) IVPB 1 g/50 mL premix        1 g 100 mL/hr over 30 Minutes Intravenous Every 6 hours 06/19/11 0423 06/20/11 0159           Patient was given sequential compression devices, early ambulation, and chemoprophylaxis to prevent DVT.  Patient benefited maximally from hospital stay and there were no complications.    Recent vital signs: Patient Vitals for the past 24 hrs:  BP Temp Temp src Pulse Resp SpO2  06/23/11 1131 - - - - 18  97 %  06/23/11 0800 - - - - 18  97 %  06/23/11 0658 143/56 mmHg 98.2 F (36.8 C) - 68  17  96 %  2011/07/09 1450 126/44 mmHg 97.7 F (36.5 C) Oral 55  18  97 %     Recent laboratory studies:  Basename 06/23/11 0523 09-Jul-2011 0713  WBC 9.3 11.3*  HGB 9.2* 8.9*  HCT 26.8* 25.8*  PLT 213 182  NA 138 137  K 4.2 4.3  CL 104 98  CO2 29 27  BUN 19 17  CREATININE 0.81 0.72  GLUCOSE 88 97  INR 2.91* 3.05*  CALCIUM 9.0 --      Discharge Medications:   Medication List  As of 06/23/2011 11:34 AM   TAKE these medications         amLODipine 2.5 MG tablet   Commonly known as: NORVASC   Take 2.5 mg by mouth daily.      aspirin 81 MG chewable tablet   Chew 81 mg by mouth daily.      atorvastatin 10 MG tablet   Commonly known as: LIPITOR   Take 10 mg by mouth every evening.      calcium-vitamin D 500-200 MG-UNIT per tablet   Commonly known as: OSCAL WITH D   Take 1 tablet by mouth 3 (three) times daily.      CERTAVITE SENIOR/ANTIOXIDANT PO   Take 1 tablet by mouth daily.      fish oil-omega-3 fatty acids 1000 MG capsule   Take 1 g by mouth daily.      isosorbide mononitrate 60 MG 24 hr tablet   Commonly known as: IMDUR   Take 30 mg by mouth daily.      memantine 10 MG tablet   Commonly known as: NAMENDA   Take 10 mg by mouth 2 (two) times daily.      methocarbamol 500 MG tablet   Commonly known as: ROBAXIN   Take 1 tablet (500 mg total)  by mouth every 6 (six) hours as needed.      metoprolol 50 MG tablet   Commonly known as: LOPRESSOR   Take 50 mg by mouth daily.      omeprazole-sodium bicarbonate 40-1100 MG per capsule   Commonly known as: ZEGERID   Take 1 capsule by mouth 2 (two) times daily.      polyethylene glycol packet   Commonly known as: MIRALAX / GLYCOLAX   Take 17 g by mouth daily.      rivastigmine 4.6 mg/24hr   Commonly known as: EXELON   Place 1 patch onto the skin daily.      traMADol 50 MG tablet   Commonly known as: ULTRAM   Take 1 tablet (50 mg total) by mouth every 6 (six) hours as needed.      warfarin 1 MG tablet   Commonly known as: COUMADIN   Take 1 tablet (1 mg total) by mouth one time only at 6 PM.            Diagnostic Studies: Dg Pelvis 1-2 Views  06/18/2011  *RADIOLOGY REPORT*  Clinical Data: Fall, left hip and leg pain  PELVIS - 1-2 VIEW  Comparison:  None.  Findings:  There is no evidence of pelvic fracture or diastasis. No other pelvic bone  lesions are seen. Mild joint space narrowing both hips and irregularity of pubic symphysis.  IMPRESSION: Negative for acute abnormality.  Original Report Authenticated By: Elsie Stain, M.D.   Dg Femur Left  06/19/2011  *RADIOLOGY REPORT*  Clinical Data: ORIF left femur.  LEFT FEMUR - 2 VIEW  Comparison: 06/10/2011  Findings: Placement intramedullary nail across the displaced midshaft left femoral fracture.  Anatomic alignment.  No hardware complicating feature.  IMPRESSION: Internal fixation left femoral fracture.  Original Report Authenticated By: Cyndie Chime, M.D.   Dg Femur Left  06/18/2011  *RADIOLOGY REPORT*  Clinical Data: Larey Seat, pain  LEFT FEMUR - 2 VIEW  Comparison: 02/06/2009  Findings: There is an acute oblique fracture of the mid shaft of the left femur with overriding fracture fragments.  This has occurred at the through an area of chronic stress injury as demonstrated on prior films.  Soft tissue swelling is present. There is mild vascular calcification.  IMPRESSION: Acute midshaft left femur fracture.  Original Report Authenticated By: Elsie Stain, M.D.   Dg Chest Portable 1 View  06/19/2011  *RADIOLOGY REPORT*  Clinical Data: Preoperative chest radiograph for femoral fracture.  PORTABLE CHEST - 1 VIEW  Comparison: Chest radiograph performed 06/18/2011  This was a repeat chest radiograph, performed due to a duplicate order.  There will be no charge to the patient for this study.  Findings: The lungs are well-aerated.  Mild chronic vascular congestion is suspected, slightly better characterized than on the prior study.  Minimal left basilar airspace opacity likely reflects atelectasis.  There is no evidence of pleural effusion or pneumothorax.  Chronic biapical pleural thickening is noted.  The cardiomediastinal silhouette is borderline normal in size; calcification is noted in the aortic arch.  No acute osseous abnormalities are seen.  IMPRESSION: Mild chronic vascular congestion  suspected; minimal left basilar airspace opacity likely reflects atelectasis.  Otherwise no significant change from the recent prior study.  Original Report Authenticated By: Tonia Ghent, M.D.   Dg Chest Portable 1 View  06/18/2011  *RADIOLOGY REPORT*  Clinical Data: Preoperative chest radiograph for fixation of left lower extremity fracture.  PORTABLE CHEST - 1 VIEW  Comparison: None.  Findings: The lungs are well-aerated and clear.  There is no evidence of focal opacification, pleural effusion or pneumothorax.  The cardiomediastinal silhouette is borderline normal in size; the indistinctness of the left side of the mediastinum is thought to reflect adjacent pericardial fat.  No acute osseous abnormalities are seen.  IMPRESSION: No acute cardiopulmonary process seen.  No displaced rib fractures identified.  Original Report Authenticated By: Tonia Ghent, M.D.    Disposition:SNF  Discharge Orders    Future Orders Please Complete By Expires   Diet - low sodium heart healthy      Weight Bearing as taught in Physical Therapy      Comments:   Use a [place] or crutches as instructed.   Discharge instructions      Scheduling Instructions:   Change left leg dressings every 3 days.   Comments:   WBAT on left  Needs daily PT [place] ambulation. Coumadin x 1 month post op. Keep INR around 2.0         Signed: Melitza Metheny G 06/23/2011, 11:34 AM

## 2011-06-23 NOTE — Progress Notes (Signed)
Clinical Social Work-CSW received word at last minute that [facility]-CSW forwarded all materials to [facility] per dtrs request and pt d/c to [facility] with d/c packet and [facility]AR transport-No further needs-Odessa Morren-MSW, [phone]

## 2011-06-23 NOTE — Progress Notes (Signed)
Clinical Social Work-CSW provided bed offers-pt family chose [facility] and completed d/c paperwork-CSW will facilitate transport as soon as d/c summary and orders completed-Analaura Messler-MSW, [phone]

## 2011-06-23 NOTE — Progress Notes (Signed)
ANTICOAGULATION CONSULT NOTE - Follow Up Consult  Pharmacy Consult for Coumadin Indication: VTE prophylaxis  Allergies  Allergen Reactions  . Asmanex     Per mar  . Flovent Diskus     Per mar  . Fosamax     Per mar  . Omeprazole     Per mar  . Symbicort     Per mar  . Valium (Diazepam)     Patient Measurements: Height: 5\' 2"  (157.5 cm) Weight: 160 lb (72.576 kg) IBW/kg (Calculated) : 50.1   Vital Signs: Temp: 98.2 F (36.8 C) (03/05 0658) BP: 143/56 mmHg (03/05 0658) Pulse Rate: 68  (03/05 0658)  Labs:  Basename 06/23/11 0523 06/22/11 0713 06/21/11 0542  HGB 9.2* 8.9* --  HCT 26.8* 25.8* 26.7*  PLT 213 182 154  APTT -- -- --  LABPROT 30.9* 32.0* 22.7*  INR 2.91* 3.05* 1.96*  HEPARINUNFRC -- -- --  CREATININE 0.81 0.72 0.77  CKTOTAL -- -- --  CKMB -- -- --  TROPONINI -- -- --   Estimated Creatinine Clearance: 48.2 ml/min (by C-G formula based on Cr of 0.81).   Medications:  Scheduled:     . amLODipine  2.5 mg Oral Daily  . atorvastatin  10 mg Oral QPM  . calcium-vitamin D  1 tablet Oral TID WC  . docusate sodium  100 mg Oral BID  . ferrous sulfate  325 mg Oral BID WC  . isosorbide mononitrate  30 mg Oral Daily  . memantine  10 mg Oral BID  . metoprolol  50 mg Oral Daily  . pantoprazole  40 mg Oral Q1200  . polyethylene glycol  17 g Oral Daily  . rivastigmine  4.6 mg Transdermal Daily  . Warfarin - Pharmacist Dosing Inpatient   Does not apply q1800    Assessment: 78yo female s/p IM femoral nail due to femur fx, on Coumadin for VTE px.  INR now trending down.    Goal of Therapy:  INR ~2 per MD   Plan:  1. Coumadin 1 mg x 1 today 2. F/U in AM 3. If discharged to SNF, recommend 1 mg daily with INR on Friday Elwin Sleight 06/23/2011,9:41 AM

## 2011-06-24 ENCOUNTER — Encounter (HOSPITAL_COMMUNITY): Payer: Self-pay | Admitting: Orthopedic Surgery

## 2012-07-14 ENCOUNTER — Non-Acute Institutional Stay: Payer: Medicare Other | Admitting: Nurse Practitioner

## 2012-07-14 ENCOUNTER — Encounter: Payer: Self-pay | Admitting: Nurse Practitioner

## 2012-07-14 VITALS — BP 174/78 | HR 76 | Ht 61.0 in | Wt 154.0 lb

## 2012-07-14 DIAGNOSIS — F0390 Unspecified dementia without behavioral disturbance: Secondary | ICD-10-CM

## 2012-07-14 DIAGNOSIS — M1612 Unilateral primary osteoarthritis, left hip: Secondary | ICD-10-CM

## 2012-07-14 DIAGNOSIS — M169 Osteoarthritis of hip, unspecified: Secondary | ICD-10-CM

## 2012-07-14 DIAGNOSIS — M1610 Unilateral primary osteoarthritis, unspecified hip: Secondary | ICD-10-CM

## 2012-07-14 DIAGNOSIS — M47817 Spondylosis without myelopathy or radiculopathy, lumbosacral region: Secondary | ICD-10-CM

## 2012-07-14 DIAGNOSIS — I10 Essential (primary) hypertension: Secondary | ICD-10-CM

## 2012-07-14 DIAGNOSIS — R3981 Functional urinary incontinence: Secondary | ICD-10-CM

## 2012-07-14 NOTE — Progress Notes (Signed)
Subjective:    Patient ID: Joann Baker, female    DOB: Oct 30, 1924, 79 y.o.   MRN: [id number]  HPI   07/14/12 c/o relapsed back pain, especially at night after sleeping for a few hours, had X-ray L spine 03/25/12 showed spondylosis.     Also c/o left hip pain sometimes, s/p ORIF     Dementia: seems worse, difficulty finding words    UTI 06/11/12 treated with 7 day course of Nitrofurantoin and her lower back pain was better after UTI treated and Tylenol scheduled for 3 days  UA showed 80,000c/ml Enterococcus an dC. Freundii--the patient is not a good historian--stated cannot always make to commode, wears adult depends all the time, lower back pain is not new, burning sensation at times which is better after she drinks more water, but she did deny suprapubic pain/discomfort/N/V or feeling sick generally.  272.4-HYPERLIPIDEMIA The patient's most recent LDL is at goal.takes atorvastatin.  294.10-DEMENTIA IN CONDITIONS CLASSIFIED ELSEWHERE WITHOU  functioning adequately in assistive living setting, takes Namenda and Exelon patch.  309.0-DEPRESSION, SITUATIONAL ACUTE  stable on Mirtazapine 7.5mg  401.9-HTN UNSPECIFIED   takes Isosorbide and Metoprolol  715.98-OSTEOARTHROSIS, UNSPECIFIED WHETHER GENERALIZED OR  c/o lower back pain.  782.3-EDEMA  trace. Takes HCTZ 12.5mg    Review of Systems  Constitutional: Positive for diaphoresis and fatigue. Negative for fever, chills, activity change, appetite change and unexpected weight change.  HENT: Negative.   Eyes: Negative.   Respiratory: Negative.   Cardiovascular: Positive for leg swelling. Negative for chest pain and palpitations.  Gastrointestinal: Negative.   Endocrine: Negative.   Genitourinary: Positive for urgency. Negative for dysuria, frequency, flank pain and pelvic pain.  Musculoskeletal: Positive for back pain, arthralgias and gait problem.  Skin: Negative for pallor and rash.  Allergic/Immunologic: Negative.   Neurological: Negative.    Hematological: Negative.   Psychiatric/Behavioral: Positive for behavioral problems (difficulty finding words ) and confusion.       Objective:   Physical Exam  Constitutional: She is oriented to person, place, and time. She appears well-developed and well-nourished. No distress.  HENT:  Head: Normocephalic and atraumatic.  Right Ear: External ear normal.  Left Ear: External ear normal.  Eyes: Conjunctivae and EOM are normal. Pupils are equal, round, and reactive to light.  Neck: Normal range of motion. Neck supple. No JVD present. No thyromegaly present.  Cardiovascular: Normal rate, regular rhythm and normal heart sounds.  Exam reveals no gallop.   Pulmonary/Chest: Effort normal and breath sounds normal. She has no wheezes.  Abdominal: Soft. Bowel sounds are normal.  Musculoskeletal: Normal range of motion. She exhibits tenderness.       Lumbar back: She exhibits pain.  Lymphadenopathy:    She has no cervical adenopathy.  Neurological: She is alert and oriented to person, place, and time. She has normal reflexes. She displays normal reflexes. No cranial nerve deficit. She exhibits normal muscle tone. Coordination normal.  Skin: Skin is warm and dry. No rash noted. She is not diaphoretic. No pallor.  Psychiatric: She has a normal mood and affect. Thought content normal.  Difficulty finding words          Assessment & Plan:    . Dementia Continue Exelon and Namenda, r/o UTI and check THS  . Osteoarthritis of left hip  s/p ORIF, pain sometimes  . Lumbosacral spondylosis without myelopathy Celebrex 200mg  daily for 4 weeks. Will obtain UA, CBC  . Essential hypertension, benign Elevated SBP chronic, asymptomatic. Observe.  Update CMP  Depression: stable, sleeps  well, weights stable.

## 2012-07-19 LAB — BASIC METABOLIC PANEL
Creatinine: 1 mg/dL (ref 0.5–1.1)
Glucose: 90 mg/dL

## 2012-07-19 LAB — CBC AND DIFFERENTIAL
Platelets: 198 10*3/uL (ref 150–399)
WBC: 8.3 10^3/mL

## 2012-07-25 ENCOUNTER — Other Ambulatory Visit: Payer: Self-pay | Admitting: Internal Medicine

## 2012-08-03 ENCOUNTER — Non-Acute Institutional Stay: Payer: Medicare Other | Admitting: Nurse Practitioner

## 2012-08-03 DIAGNOSIS — G4700 Insomnia, unspecified: Secondary | ICD-10-CM

## 2012-08-03 DIAGNOSIS — I25810 Atherosclerosis of coronary artery bypass graft(s) without angina pectoris: Secondary | ICD-10-CM

## 2012-08-03 DIAGNOSIS — N390 Urinary tract infection, site not specified: Secondary | ICD-10-CM | POA: Insufficient documentation

## 2012-08-03 DIAGNOSIS — I10 Essential (primary) hypertension: Secondary | ICD-10-CM

## 2012-08-03 DIAGNOSIS — F0390 Unspecified dementia without behavioral disturbance: Secondary | ICD-10-CM

## 2012-08-03 NOTE — Progress Notes (Signed)
Patient ID: Joann Baker, female   DOB: February 01, 1925, 79 y.o.   MRN: [id number]  Chief Complaint:  Chief Complaint  Patient presents with  . Medical Managment of Chronic Issues    uti     HPI:    Problem List Items Addressed This Visit     ICD-9-CM   Essential hypertension, benign     Controlled with mildly elevated SBP 140s, takes Metoprolol 50mg  daily and HCTZ 12.5mg  daily    Urinary tract infection, site not specified - Primary     07/18/12 UA showed enterococcus, treated with total 10 day course of Nitrofurantoin 100mg  bid. Now the patient is asymptomatic.     Insomnia     Sleeps better with Mirtazapine 7.5mg  nightly    CAD (coronary artery disease) of artery bypass graft     No angina since last visit, takes Imdur 30mg , ASA 81mg , Atorvastatin 10mg , Fish oil    Dementia (Chronic)     Functional adequately in AL, continue Exelon 4.6mg  patch and Namenda       Review of Systems:  Review of Systems  Constitutional: Negative for fever, chills, weight loss, malaise/fatigue and diaphoresis.  HENT: Positive for hearing loss. Negative for ear pain, nosebleeds, congestion, sore throat, neck pain and ear discharge.   Eyes: Negative for pain, discharge and redness.  Respiratory: Negative for cough, sputum production, shortness of breath and wheezing.   Cardiovascular: Negative for chest pain, palpitations, claudication, leg swelling and PND.  Gastrointestinal: Negative for heartburn, nausea, vomiting, abdominal pain, diarrhea, constipation and blood in stool.  Genitourinary: Positive for frequency. Negative for dysuria, urgency and hematuria.  Musculoskeletal: Positive for back pain. Negative for myalgias and joint pain.  Skin: Negative for itching and rash.  Neurological: Negative for dizziness, tremors, sensory change, speech change, focal weakness, seizures, loss of consciousness and weakness.  Endo/Heme/Allergies: Negative for environmental allergies and polydipsia. Does not  bruise/bleed easily.  Psychiatric/Behavioral: Positive for memory loss. Negative for depression and hallucinations. The patient is not nervous/anxious and does not have insomnia.      Medications: Reviewed at [facility]   Physical Exam: Physical Exam  Constitutional: She appears well-developed and well-nourished.  HENT:  Head: Normocephalic and atraumatic.  Eyes: EOM are normal. Pupils are equal, round, and reactive to light. Right eye exhibits no discharge. Left eye exhibits no discharge.  Neck: Normal range of motion. Neck supple. No JVD present. No thyromegaly present.  Cardiovascular: Normal rate and regular rhythm.   No murmur heard. Pulmonary/Chest: No respiratory distress. She has no wheezes. She has no rales.  Abdominal: Soft. Bowel sounds are normal. She exhibits no distension. There is no tenderness.  Musculoskeletal: Normal range of motion. She exhibits no edema and no tenderness.  Lymphadenopathy:    She has no cervical adenopathy.  Neurological: She is alert. She has normal reflexes. She displays normal reflexes. No cranial nerve deficit. She exhibits normal muscle tone. Coordination normal.  Skin: Skin is warm and dry. No rash noted. No erythema.     Filed Vitals:   08/03/12 1807  BP: 141/71  Pulse: 51  Temp: 98.3 F (36.8 C)  TempSrc: Tympanic  Resp: 20      Labs reviewed: Basic Metabolic Panel:  Recent Labs  13/08/65  NA 142  K 4.0  BUN 23*  CREATININE 1.0  TSH 2.91    Liver Function Tests: No results found for this basename: AST, ALT, ALKPHOS, BILITOT, PROT, ALBUMIN,  in the last 8760 hours  CBC:  Recent  Labs  07/19/12  WBC 8.3  HGB 13.1  HCT 39  PLT 198    Anemia Panel: No results found for this basename: IRON, FOLATE, VITAMINB12,  in the last 8760 hours  Significant Diagnostic Results:     Assessment/Plan Urinary tract infection, site not specified 07/18/12 UA showed enterococcus, treated with total 10 day course of Nitrofurantoin  100mg  bid. Now the patient is asymptomatic.   Dementia Functional adequately in AL, continue Exelon 4.6mg  patch and Namenda  Insomnia Sleeps better with Mirtazapine 7.5mg  nightly  Essential hypertension, benign Controlled with mildly elevated SBP 140s, takes Metoprolol 50mg  daily and HCTZ 12.5mg  daily  CAD (coronary artery disease) of artery bypass graft No angina since last visit, takes Imdur 30mg , ASA 81mg , Atorvastatin 10mg , Fish oil      Family/ staff Communication: none   Goals of care: AL     Labs/tests ordered none

## 2012-08-03 NOTE — Assessment & Plan Note (Signed)
Controlled with mildly elevated SBP 140s, takes Metoprolol 50mg daily and HCTZ 12.5mg daily       

## 2012-08-03 NOTE — Assessment & Plan Note (Signed)
Sleeps better with Mirtazapine 7.5mg nightly  

## 2012-08-03 NOTE — Assessment & Plan Note (Signed)
07/18/12 UA showed enterococcus, treated with total 10 day course of Nitrofurantoin 100mg  bid. Now the patient is asymptomatic.

## 2012-08-03 NOTE — Assessment & Plan Note (Signed)
Functional adequately in AL, continue Exelon 4.6mg  patch and Namenda

## 2012-08-03 NOTE — Assessment & Plan Note (Signed)
No angina since last visit, takes Imdur 30mg, ASA 81mg, Atorvastatin 10mg, Fish oil   

## 2012-08-25 ENCOUNTER — Encounter: Payer: Self-pay | Admitting: Nurse Practitioner

## 2012-08-25 ENCOUNTER — Non-Acute Institutional Stay: Payer: Medicare Other | Admitting: Nurse Practitioner

## 2012-08-25 VITALS — BP 152/74 | HR 64 | Ht 61.0 in | Wt 154.0 lb

## 2012-08-25 DIAGNOSIS — Z299 Encounter for prophylactic measures, unspecified: Secondary | ICD-10-CM

## 2012-08-25 DIAGNOSIS — I10 Essential (primary) hypertension: Secondary | ICD-10-CM

## 2012-08-25 DIAGNOSIS — K5900 Constipation, unspecified: Secondary | ICD-10-CM | POA: Insufficient documentation

## 2012-08-25 DIAGNOSIS — I25810 Atherosclerosis of coronary artery bypass graft(s) without angina pectoris: Secondary | ICD-10-CM

## 2012-08-25 DIAGNOSIS — F0390 Unspecified dementia without behavioral disturbance: Secondary | ICD-10-CM

## 2012-08-25 NOTE — Assessment & Plan Note (Signed)
Stable on MiraLax daily.  

## 2012-08-25 NOTE — Assessment & Plan Note (Signed)
Functional adequately in AL, continue Exelon 4.6mg patch and Namenda     

## 2012-08-25 NOTE — Progress Notes (Signed)
Patient ID: Joann Baker, female   DOB: [birth year], 79 y.o.   MRN: [id number] Code Status: DNR and Living Will  Allergies  Allergen Reactions  . Alendronate Sodium     Per mar  . Budesonide-Formoterol Fumarate     Per mar  . Fluticasone Propionate (Inhal)     Per mar  . Mometasone Furoate     Per mar  . Omeprazole     Per mar  . Valium (Diazepam)     Chief Complaint  Patient presents with  . Medical Managment of Chronic Issues    Follow up on memory and low back pain    HPI: Patient is a 79 y.o. female seen in the clinic at [facility] today for dementia, edema, CAD, constipation, and insomnia.  Problem List Items Addressed This Visit   None    Visit Diagnoses   Preventive measure    -  Primary    Relevant Orders       DNR (Do Not Resuscitate)       Review of Systems:  Review of Systems  Constitutional: Negative for fever, chills, weight loss, malaise/fatigue and diaphoresis.  HENT: Positive for hearing loss. Negative for ear pain, nosebleeds, congestion, sore throat, neck pain and ear discharge.   Eyes: Negative for pain, discharge and redness.  Respiratory: Negative for cough, sputum production, shortness of breath and wheezing.   Cardiovascular: Negative for chest pain, palpitations, claudication, leg swelling and PND.  Gastrointestinal: Negative for heartburn, nausea, vomiting, abdominal pain, diarrhea, constipation and blood in stool.  Genitourinary: Positive for frequency. Negative for dysuria, urgency and hematuria.  Musculoskeletal: Positive for back pain. Negative for myalgias and joint pain.  Skin: Negative for itching and rash.  Neurological: Negative for dizziness, tremors, sensory change, speech change, focal weakness, seizures, loss of consciousness and weakness.  Endo/Heme/Allergies: Negative for environmental allergies and polydipsia. Does not bruise/bleed easily.  Psychiatric/Behavioral: Positive for memory loss. Negative for depression and  hallucinations. The patient is not nervous/anxious and does not have insomnia.      Past Medical History  Diagnosis Date  . Hypertension   . DJD (degenerative joint disease)   . Hyperlipidemia   . Coronary atherosclerosis of native coronary artery   . Adjustment disorder with depressed mood   . Dementia in conditions classified elsewhere without behavioral disturbance(294.10)   . Right bundle branch block   . Asthma   . GERD (gastroesophageal reflux disease)   . Unspecified constipation   . Pain in joint, pelvic region and thigh     left  . Osteoporosis   . Unspecified urinary incontinence   . Anemia   . Depression   . Edema   . Urinary tract infection, site not specified    Past Surgical History  Procedure Laterality Date  . Femur im nail  06/19/2011    Procedure: INTRAMEDULLARY (IM) NAIL FEMORAL;  Surgeon: Harvie Junior, MD;  Location: [facility] OR;  Service: Orthopedics;  Laterality: Left;  . Abdominal hysterectomy    . Cholecystectomy     Social History:   reports that she has never smoked. She has never used smokeless tobacco. She reports that she does not drink alcohol or use illicit drugs.  No family history on file.  Medications: Patient's Medications  New Prescriptions   No medications on file  Previous Medications   ACETAMINOPHEN (TYLENOL) 650 MG CR TABLET    Take 650 mg by mouth. Take one every 6 hours as needed for pain  ASPIRIN 81 MG CHEWABLE TABLET    Chew 81 mg by mouth daily.   ATORVASTATIN (LIPITOR) 10 MG TABLET    Take 10 mg by mouth every evening.   CALCIUM-VITAMIN D (OSCAL WITH D) 500-200 MG-UNIT PER TABLET    Take 1 tablet by mouth 3 (three) times daily.   EXELON 4.6 MG/24HR    APPLY 1 PATCH DAILY.   FISH OIL-OMEGA-3 FATTY ACIDS 1000 MG CAPSULE    Take 1 g by mouth daily.   HYDROCHLOROTHIAZIDE (HYDRODIURIL) 12.5 MG TABLET    Take 12.5 mg by mouth daily.   ISOSORBIDE MONONITRATE (IMDUR) 60 MG 24 HR TABLET    Take 30 mg by mouth daily.   MEMANTINE HCL ER  (NAMENDA XR) 28 MG CP24    Take 28 mg by mouth. Take one tablet daily for memory   METOPROLOL (LOPRESSOR) 50 MG TABLET    Take 50 mg by mouth daily.   MIRTAZAPINE (REMERON) 7.5 MG TABLET    Take 7.5 mg by mouth at bedtime.   MULTIPLE VITAMINS-MINERALS (CERTAVITE SENIOR/ANTIOXIDANT PO)    Take 1 tablet by mouth daily.   OMEPRAZOLE-SODIUM BICARBONATE (ZEGERID) 40-1100 MG PER CAPSULE    Take 1 capsule by mouth 2 (two) times daily.   POLYETHYLENE GLYCOL (MIRALAX / GLYCOLAX) PACKET    Take 17 g by mouth daily.  Modified Medications   No medications on file  Discontinued Medications   AMLODIPINE (NORVASC) 2.5 MG TABLET    Take 2.5 mg by mouth daily.     Physical Exam: Physical Exam  Constitutional: She appears well-developed and well-nourished.  HENT:  Head: Normocephalic and atraumatic.  Eyes: EOM are normal. Pupils are equal, round, and reactive to light. Right eye exhibits no discharge. Left eye exhibits no discharge.  Neck: Normal range of motion. Neck supple. No JVD present. No thyromegaly present.  Cardiovascular: Normal rate and regular rhythm.   No murmur heard. Pulmonary/Chest: No respiratory distress. She has no wheezes. She has no rales.  Abdominal: Soft. Bowel sounds are normal. She exhibits no distension. There is no tenderness.  Musculoskeletal: Normal range of motion. She exhibits no edema and no tenderness.  Lymphadenopathy:    She has no cervical adenopathy.  Neurological: She is alert. She has normal reflexes. No cranial nerve deficit. She exhibits normal muscle tone. Coordination normal.  Skin: Skin is warm and dry. No rash noted. No erythema.    Filed Vitals:   08/25/12 1519  BP: 152/74  Pulse: 64  Height: 5\' 1"  (1.549 m)  Weight: 154 lb (69.854 kg)      Labs reviewed: Basic Metabolic Panel:  Recent Labs  16/10/96  NA 142  K 4.0  BUN 23*  CREATININE 1.0  TSH 2.91   Liver Function Tests: No results found for this basename: AST, ALT, ALKPHOS, BILITOT,  PROT, ALBUMIN,  in the last 8760 hours No results found for this basename: LIPASE, AMYLASE,  in the last 8760 hours No results found for this basename: AMMONIA,  in the last 8760 hours CBC:  Recent Labs  07/19/12  WBC 8.3  HGB 13.1  HCT 39  PLT 198   Lipid Panel: No results found for this basename: CHOL, HDL, LDLCALC, TRIG, CHOLHDL, LDLDIRECT,  in the last 8760 hours Anemia Panel: No results found for this basename: FOLATE, IRON, VITAMINB12,  in the last 8760 hours  Past Procedures:     Assessment/Plan No problem-specific assessment & plan notes found for this encounter.   Family/ Staff Communication  none  Goals of Care: AL  Labs/tests ordered: none

## 2012-08-25 NOTE — Assessment & Plan Note (Signed)
No angina since last visit, takes Imdur 30mg, ASA 81mg, Atorvastatin 10mg, Fish oil   

## 2012-08-25 NOTE — Assessment & Plan Note (Signed)
Controlled with mildly elevated SBP 140s, takes Metoprolol 50mg daily and HCTZ 12.5mg daily       

## 2013-01-02 ENCOUNTER — Non-Acute Institutional Stay: Payer: Medicare Other | Admitting: Nurse Practitioner

## 2013-01-02 ENCOUNTER — Encounter: Payer: Self-pay | Admitting: Nurse Practitioner

## 2013-01-02 DIAGNOSIS — L989 Disorder of the skin and subcutaneous tissue, unspecified: Secondary | ICD-10-CM | POA: Insufficient documentation

## 2013-01-02 DIAGNOSIS — I10 Essential (primary) hypertension: Secondary | ICD-10-CM

## 2013-01-02 DIAGNOSIS — K5900 Constipation, unspecified: Secondary | ICD-10-CM

## 2013-01-02 DIAGNOSIS — F0390 Unspecified dementia without behavioral disturbance: Secondary | ICD-10-CM

## 2013-01-02 DIAGNOSIS — K219 Gastro-esophageal reflux disease without esophagitis: Secondary | ICD-10-CM

## 2013-01-02 DIAGNOSIS — G4700 Insomnia, unspecified: Secondary | ICD-10-CM

## 2013-01-02 DIAGNOSIS — I25810 Atherosclerosis of coronary artery bypass graft(s) without angina pectoris: Secondary | ICD-10-CM

## 2013-01-02 NOTE — Assessment & Plan Note (Signed)
Stable on MiraLax daily.  

## 2013-01-02 NOTE — Progress Notes (Signed)
Patient ID: Joann Baker, female   DOB: January 23, 1925, 79 y.o.   MRN: [id number] Code Status: DNR  Allergies  Allergen Reactions  . Alendronate Sodium     Per mar  . Budesonide-Formoterol Fumarate     Per mar  . Fluticasone Propionate (Inhal)     Per mar  . Mometasone Furoate     Per mar  . Omeprazole     Per mar  . Valium [Diazepam]     Chief Complaint  Patient presents with  . Medical Managment of Chronic Issues    right forehed skin lesion.     HPI: Patient is a 79 y.o. female seen in the [facility] at [facility] today for evaluation of non healing skin lesion right forehead and other chronic medical conditions.  Problem List Items Addressed This Visit   CAD (coronary artery disease) of artery bypass graft     No angina since last visit, takes Imdur 30mg , ASA 81mg , Atorvastatin 10mg , Fish oil        Dementia (Chronic)     Functional adequately in AL, continue Exelon 4.6mg  patch and Namenda        Essential hypertension, benign (Chronic)     Controlled with mildly elevated SBP 140s, takes Metoprolol 50mg  daily and HCTZ 12.5mg  daily        GERD (gastroesophageal reflux disease)     Stable on Zegerid daily.     Insomnia     Sleeps better with Mirtazapine 7.5mg  nightly      Skin lesion of scalp - Primary   Unspecified constipation     Stable on MiraLax daily.          Review of Systems:  Review of Systems  Constitutional: Negative for fever, chills, weight loss, malaise/fatigue and diaphoresis.  HENT: Positive for hearing loss. Negative for ear pain, nosebleeds, congestion, sore throat, neck pain and ear discharge.   Eyes: Negative for pain, discharge and redness.  Respiratory: Negative for cough, sputum production, shortness of breath and wheezing.   Cardiovascular: Negative for chest pain, palpitations, claudication, leg swelling and PND.  Gastrointestinal: Negative for heartburn, nausea, vomiting, abdominal pain, diarrhea, constipation and  blood in stool.  Genitourinary: Positive for frequency. Negative for dysuria, urgency and hematuria.  Musculoskeletal: Positive for back pain. Negative for myalgias and joint pain.  Skin: Negative for itching and rash.       The right forehead skin lesion right on her hairline, scaly, non healing.   Neurological: Negative for dizziness, tremors, sensory change, speech change, focal weakness, seizures, loss of consciousness and weakness.  Endo/Heme/Allergies: Negative for environmental allergies and polydipsia. Does not bruise/bleed easily.  Psychiatric/Behavioral: Positive for memory loss. Negative for depression and hallucinations. The patient is not nervous/anxious and does not have insomnia.      Past Medical History  Diagnosis Date  . Hypertension   . DJD (degenerative joint disease)   . Hyperlipidemia   . Coronary atherosclerosis of native coronary artery   . Adjustment disorder with depressed mood   . Dementia in conditions classified elsewhere without behavioral disturbance(294.10)   . Right bundle branch block   . Asthma   . GERD (gastroesophageal reflux disease)   . Unspecified constipation   . Pain in joint, pelvic region and thigh     left  . Osteoporosis   . Unspecified urinary incontinence   . Anemia   . Depression   . Edema   . Urinary tract infection, site not specified  Past Surgical History  Procedure Laterality Date  . Femur im nail  06/19/2011    Procedure: INTRAMEDULLARY (IM) NAIL FEMORAL;  Surgeon: Harvie Junior, MD;  Location: [facility] OR;  Service: Orthopedics;  Laterality: Left;  . Abdominal hysterectomy    . Cholecystectomy     Social History:   reports that she has never smoked. She has never used smokeless tobacco. She reports that she does not drink alcohol or use illicit drugs.  Medications: Patient's Medications  New Prescriptions   No medications on file  Previous Medications   ACETAMINOPHEN (TYLENOL) 650 MG CR TABLET    Take 650 mg by mouth.  Take one every 6 hours as needed for pain   ASPIRIN 81 MG CHEWABLE TABLET    Chew 81 mg by mouth daily.   ATORVASTATIN (LIPITOR) 10 MG TABLET    Take 10 mg by mouth every evening.   CALCIUM-VITAMIN D (OSCAL WITH D) 500-200 MG-UNIT PER TABLET    Take 1 tablet by mouth 3 (three) times daily.   EXELON 4.6 MG/24HR    APPLY 1 PATCH DAILY.   FISH OIL-OMEGA-3 FATTY ACIDS 1000 MG CAPSULE    Take 1 g by mouth daily.   HYDROCHLOROTHIAZIDE (HYDRODIURIL) 12.5 MG TABLET    Take 12.5 mg by mouth daily.   ISOSORBIDE MONONITRATE (IMDUR) 60 MG 24 HR TABLET    Take 30 mg by mouth daily.   MEMANTINE HCL ER (NAMENDA XR) 28 MG CP24    Take 28 mg by mouth. Take one tablet daily for memory   METOPROLOL (LOPRESSOR) 50 MG TABLET    Take 50 mg by mouth daily.   MIRTAZAPINE (REMERON) 7.5 MG TABLET    Take 7.5 mg by mouth at bedtime.   MULTIPLE VITAMINS-MINERALS (CERTAVITE SENIOR/ANTIOXIDANT PO)    Take 1 tablet by mouth daily.   OMEPRAZOLE-SODIUM BICARBONATE (ZEGERID) 40-1100 MG PER CAPSULE    Take 1 capsule by mouth 2 (two) times daily.   POLYETHYLENE GLYCOL (MIRALAX / GLYCOLAX) PACKET    Take 17 g by mouth daily.  Modified Medications   No medications on file  Discontinued Medications   No medications on file     Physical Exam: Physical Exam  Constitutional: She appears well-developed and well-nourished.  HENT:  Head: Normocephalic and atraumatic.  Eyes: EOM are normal. Pupils are equal, round, and reactive to light. Right eye exhibits no discharge. Left eye exhibits no discharge.  Neck: Normal range of motion. Neck supple. No JVD present. No thyromegaly present.  Cardiovascular: Normal rate and regular rhythm.   No murmur heard. Pulmonary/Chest: No respiratory distress. She has no wheezes. She has no rales.  Abdominal: Soft. Bowel sounds are normal. She exhibits no distension. There is no tenderness.  Musculoskeletal: Normal range of motion. She exhibits no edema and no tenderness.  Lymphadenopathy:    She  has no cervical adenopathy.  Neurological: She is alert. She has normal reflexes. No cranial nerve deficit. She exhibits normal muscle tone. Coordination normal.  Skin: Skin is warm and dry. No rash noted. No erythema.  A skin lesion, scaly, non-healing about a quarter size right on her hairline of the right forehead.     Filed Vitals:   01/02/13 1409  BP: 142/70  Pulse: 56  Temp: 97.6 F (36.4 C)  TempSrc: Tympanic  Resp: 20      Labs reviewed: Basic Metabolic Panel:  Recent Labs  40/98/11  NA 142  K 4.0  BUN 23*  CREATININE 1.0  TSH  2.91    CBC:  Recent Labs  07/19/12  WBC 8.3  HGB 13.1  HCT 39  PLT 198    Past Procedures:  03/25/12 X-ray R+L knees, lumbar spine, pelvis: degenerative spondylosis. No acute fracture, malalignment, lytic destructive lesion, or vertebral body compression deformity. Extensive atherosclerotic vascular calcifications at abdominal aorta and branches.Mild degenerative irregularity at the ischial tuberosity regions with minimal osteoarthritis at the sacroiliac joints. Small degenerative spurs at rhe anterior patella. Slight old healed fracture deformity with orthopedic rods and scres in place at rhe distal shaft and metaphysis of the femur.    Assessment/Plan CAD (coronary artery disease) of artery bypass graft No angina since last visit, takes Imdur 30mg , ASA 81mg , Atorvastatin 10mg , Fish oil      Dementia Functional adequately in AL, continue Exelon 4.6mg  patch and Namenda      Essential hypertension, benign Controlled with mildly elevated SBP 140s, takes Metoprolol 50mg  daily and HCTZ 12.5mg  daily      Insomnia Sleeps better with Mirtazapine 7.5mg  nightly    Unspecified constipation Stable on MiraLax daily.     GERD (gastroesophageal reflux disease) Stable on Zegerid daily.     Family/ Staff Communication: observe the patient.   Goals of Care: [place]  Labs/tests ordered: none

## 2013-01-02 NOTE — Assessment & Plan Note (Signed)
Sleeps better with Mirtazapine 7.5mg nightly  

## 2013-01-02 NOTE — Assessment & Plan Note (Signed)
No angina since last visit, takes Imdur 30mg, ASA 81mg, Atorvastatin 10mg, Fish oil   

## 2013-01-02 NOTE — Assessment & Plan Note (Signed)
Stable on Zegerid daily.    

## 2013-01-02 NOTE — Assessment & Plan Note (Signed)
Functional adequately in AL, continue Exelon 4.6mg  patch and Namenda

## 2013-01-02 NOTE — Assessment & Plan Note (Signed)
Controlled with mildly elevated SBP 140s, takes Metoprolol 50mg daily and HCTZ 12.5mg daily       

## 2013-02-15 HISTORY — PX: SKIN CANCER EXCISION: SHX779

## 2013-03-02 ENCOUNTER — Encounter: Payer: Self-pay | Admitting: Internal Medicine

## 2013-03-02 ENCOUNTER — Non-Acute Institutional Stay: Payer: Medicare Other | Admitting: Internal Medicine

## 2013-03-02 VITALS — BP 140/74 | HR 68 | Ht 61.0 in | Wt 156.0 lb

## 2013-03-02 DIAGNOSIS — I25810 Atherosclerosis of coronary artery bypass graft(s) without angina pectoris: Secondary | ICD-10-CM

## 2013-03-02 DIAGNOSIS — L989 Disorder of the skin and subcutaneous tissue, unspecified: Secondary | ICD-10-CM

## 2013-03-02 DIAGNOSIS — I10 Essential (primary) hypertension: Secondary | ICD-10-CM

## 2013-03-02 DIAGNOSIS — K219 Gastro-esophageal reflux disease without esophagitis: Secondary | ICD-10-CM

## 2013-03-02 DIAGNOSIS — E785 Hyperlipidemia, unspecified: Secondary | ICD-10-CM | POA: Insufficient documentation

## 2013-03-02 DIAGNOSIS — F0390 Unspecified dementia without behavioral disturbance: Secondary | ICD-10-CM

## 2013-03-02 NOTE — Patient Instructions (Signed)
Continue current medications. 

## 2013-03-02 NOTE — Progress Notes (Signed)
Subjective:    Patient ID: Joann Baker, female    DOB: 14-Dec-1924, 79 y.o.   MRN: [id number]  Chief Complaint  Patient presents with  . Medical Managment of Chronic Issues    blood pressure, dementia CAD    HPI Essential hypertension, benign: controlled  CAD (coronary artery disease) of artery bypass graft:no angina. Some dyspnea with exeertion  Dementia: unchanged. On Namenda.  GERD (gastroesophageal reflux disease):asymptomatic  Skin lesion of scalp: excisional biopsy 02/15/13 by Dr. Irene Limbo  Hyperlipidemia: no recent lab    Current Outpatient Prescriptions on File Prior to Visit  Medication Sig Dispense Refill  . acetaminophen (TYLENOL) 650 MG CR tablet Take 650 mg by mouth. Take one every 6 hours as needed for pain      . aspirin 81 MG chewable tablet Chew 81 mg by mouth daily.      Marland Kitchen atorvastatin (LIPITOR) 10 MG tablet Take 10 mg by mouth every evening.      . calcium-vitamin D (OSCAL WITH D) 500-200 MG-UNIT per tablet Take 1 tablet by mouth 3 (three) times daily.      . EXELON 4.6 MG/24HR APPLY 1 PATCH DAILY.  3 patch  5  . fish oil-omega-3 fatty acids 1000 MG capsule Take 1 g by mouth daily.      . hydrochlorothiazide (HYDRODIURIL) 12.5 MG tablet Take 12.5 mg by mouth daily.      . isosorbide mononitrate (IMDUR) 60 MG 24 hr tablet Take 30 mg by mouth daily.      . metoprolol (LOPRESSOR) 50 MG tablet Take 50 mg by mouth daily.      . mirtazapine (REMERON) 7.5 MG tablet Take 7.5 mg by mouth at bedtime.      . Multiple Vitamins-Minerals (CERTAVITE SENIOR/ANTIOXIDANT PO) Take 1 tablet by mouth daily.      Marland Kitchen omeprazole-sodium bicarbonate (ZEGERID) 40-1100 MG per capsule Take 1 capsule by mouth 2 (two) times daily.      . polyethylene glycol (MIRALAX / GLYCOLAX) packet Take 17 g by mouth daily.       No current facility-administered medications on file prior to visit.    Review of Systems  Constitutional: Positive for diaphoresis and fatigue. Negative for fever,  chills, activity change, appetite change and unexpected weight change.  HENT: Negative.   Eyes: Negative.   Respiratory: Negative.   Cardiovascular: Positive for leg swelling. Negative for chest pain and palpitations.  Gastrointestinal: Negative.   Endocrine: Negative.   Genitourinary: Positive for urgency. Negative for dysuria, frequency, flank pain and pelvic pain.  Musculoskeletal: Positive for arthralgias, back pain and gait problem.  Skin: Positive for wound (removal of skin cancer of the right forehead 02/15/13). Negative for pallor and rash.  Allergic/Immunologic: Negative.   Neurological: Positive for speech difficulty (Difficulty with wored finding.).       Dementia. 09/10/12 MMSE; total 19/30   Hematological: Negative.   Psychiatric/Behavioral: Positive for confusion.       Objective:BP 140/74  Pulse 68  Ht 5\' 1"  (1.549 m)  Wt 156 lb (70.761 kg)  BMI 29.49 kg/m2    Physical Exam  Constitutional: She appears well-developed and well-nourished.  HENT:  Head: Normocephalic and atraumatic.  Eyes: EOM are normal. Pupils are equal, round, and reactive to light. Right eye exhibits no discharge. Left eye exhibits no discharge.  Neck: Normal range of motion. Neck supple. No JVD present. No thyromegaly present.  Cardiovascular: Normal rate and regular rhythm.   No murmur heard. Pulmonary/Chest: No respiratory distress. She  has no wheezes. She has no rales.  Abdominal: Soft. Bowel sounds are normal. She exhibits no distension. There is no tenderness.  Musculoskeletal: Normal range of motion. She exhibits no edema and no tenderness.  Lymphadenopathy:    She has no cervical adenopathy.  Neurological: She is alert. She has normal reflexes. No cranial nerve deficit. She exhibits normal muscle tone. Coordination normal.  Skin: Skin is warm and dry. No rash noted. No erythema.  Healing site of excisional biopsy of skin cancer of right forehead. 1.25 inch scar.           Assessment & Plan:  Essential hypertension, benign: controllede  CAD (coronary artery disease) of artery bypass graft: stable  Dementia: unchanged  GERD (gastroesophageal reflux disease): asymptomatic  Skin lesion of scalp: excised  Hyperlipidemia:no recnt lab. Check next year.

## 2013-05-11 ENCOUNTER — Non-Acute Institutional Stay: Payer: Medicare Other | Admitting: Nurse Practitioner

## 2013-05-11 DIAGNOSIS — K5900 Constipation, unspecified: Secondary | ICD-10-CM

## 2013-05-11 DIAGNOSIS — K219 Gastro-esophageal reflux disease without esophagitis: Secondary | ICD-10-CM

## 2013-05-11 DIAGNOSIS — G4700 Insomnia, unspecified: Secondary | ICD-10-CM

## 2013-05-11 DIAGNOSIS — F0390 Unspecified dementia without behavioral disturbance: Secondary | ICD-10-CM

## 2013-05-11 DIAGNOSIS — J189 Pneumonia, unspecified organism: Secondary | ICD-10-CM

## 2013-05-11 DIAGNOSIS — I25810 Atherosclerosis of coronary artery bypass graft(s) without angina pectoris: Secondary | ICD-10-CM

## 2013-05-11 DIAGNOSIS — I10 Essential (primary) hypertension: Secondary | ICD-10-CM

## 2013-05-11 NOTE — Assessment & Plan Note (Signed)
No angina since last visit, takes Imdur 30mg, ASA 81mg, Atorvastatin 10mg, Fish oil   

## 2013-05-11 NOTE — Assessment & Plan Note (Signed)
Functional adequately in AL, continue Exelon 4.6mg  patch and Namenda. Staff reported the patient's seems more confused than her usual state. No focal neurological deficits noted. Will obtain BMP since her oral intake has not been optimal since onset of PNA. UA 05/05/13 showed 70,000c/ml lactobacillus-denied dysuria or urinary or CVA tenderness.

## 2013-05-11 NOTE — Assessment & Plan Note (Signed)
Stable on Zegerid daily.    

## 2013-05-11 NOTE — Assessment & Plan Note (Signed)
Stable on MiraLax daily.  

## 2013-05-11 NOTE — Assessment & Plan Note (Signed)
Congestive cough, CXR 05/04/13 showed left lower lung pneumonitis, total 10 day course of Avelox 400mg daily started 05/07/13, improving.    

## 2013-05-11 NOTE — Progress Notes (Signed)
Patient ID: Joann Baker, female   DOB: 03/09/1925, 80 y.o.   MRN: [id number] Code Status: DNR  Allergies  Allergen Reactions  . Alendronate Sodium     Per mar  . Budesonide-Formoterol Fumarate     Per mar  . Fluticasone Propionate (Inhal)     Per mar  . Mometasone Furoate     Per mar  . Omeprazole     Per mar  . Valium [Diazepam]     Chief Complaint  Patient presents with  . Medical Managment of Chronic Issues    confusion, LLL pneumoniits.   . Acute Visit    HPI: Patient is a 80 y.o. female [facility] today for evaluation of congestive cough/left lower lung pneumonitis and chronic medical conditions.  Problem List Items Addressed This Visit   CAD (coronary artery disease) of artery bypass graft     No angina since last visit, takes Imdur 30mg , ASA 81mg , Atorvastatin 10mg , Fish oil          CAP (community acquired pneumonia) - Primary     Congestive cough, CXR 05/04/13 showed left lower lung pneumonitis, total 10 day course of Avelox 400mg  daily started 05/07/13, improving.     Dementia (Chronic)     Functional adequately in AL, continue Exelon 4.6mg  patch and Namenda. Staff reported the patient's seems more confused than her usual state. No focal neurological deficits noted. Will obtain BMP since her oral intake has not been optimal since onset of PNA. UA 05/05/13 showed 70,000c/ml lactobacillus-denied dysuria or urinary or CVA tenderness.           Essential hypertension, benign (Chronic)     Controlled with mildly elevated SBP 140s, takes Metoprolol 50mg  daily and HCTZ 12.5mg  daily          GERD (gastroesophageal reflux disease)     Stable on Zegerid daily.       Insomnia     Sleeps better with Mirtazapine 7.5mg  nightly        Unspecified constipation     Stable on MiraLax daily.            Review of Systems:  Review of Systems  Constitutional: Negative for fever, chills, weight loss, malaise/fatigue and  diaphoresis.  HENT: Positive for hearing loss. Negative for congestion, ear discharge, ear pain, nosebleeds and sore throat.   Eyes: Negative for pain, discharge and redness.  Respiratory: Positive for cough. Negative for sputum production, shortness of breath and wheezing.   Cardiovascular: Negative for chest pain, palpitations, claudication, leg swelling and PND.  Gastrointestinal: Negative for heartburn, nausea, vomiting, abdominal pain, diarrhea, constipation and blood in stool.  Genitourinary: Positive for frequency. Negative for dysuria, urgency and hematuria.  Musculoskeletal: Positive for back pain. Negative for joint pain, myalgias and neck pain.  Skin: Negative for itching and rash.       The right forehead skin lesion right on her hairline, scaly, non healing.   Neurological: Negative for dizziness, tremors, sensory change, speech change, focal weakness, seizures, loss of consciousness and weakness.  Endo/Heme/Allergies: Negative for environmental allergies and polydipsia. Does not bruise/bleed easily.  Psychiatric/Behavioral: Positive for memory loss. Negative for depression and hallucinations. The patient is not nervous/anxious and does not have insomnia.        New onset of confusion since the PNA     Past Medical History  Diagnosis Date  . Hypertension   . DJD (degenerative joint disease)   . Hyperlipidemia   .  Coronary atherosclerosis of native coronary artery   . Adjustment disorder with depressed mood   . Dementia in conditions classified elsewhere without behavioral disturbance(294.10)   . Right bundle branch block   . Asthma   . GERD (gastroesophageal reflux disease)   . Unspecified constipation   . Pain in joint, pelvic region and thigh     left  . Osteoporosis   . Unspecified urinary incontinence   . Anemia   . Depression   . Edema   . Urinary tract infection, site not specified    Past Surgical History  Procedure Laterality Date  . Femur im nail   06/19/2011    Procedure: INTRAMEDULLARY (IM) NAIL FEMORAL;  Surgeon: Harvie Junior, MD;  Location: [facility] OR;  Service: Orthopedics;  Laterality: Left;  . Abdominal hysterectomy    . Cholecystectomy    . Skin cancer excision  02/15/2013    right forehead Dr. Irene Limbo   Social History:   reports that she has never smoked. She has never used smokeless tobacco. She reports that she does not drink alcohol or use illicit drugs.  Medications: Patient's Medications  New Prescriptions   No medications on file  Previous Medications   ACETAMINOPHEN (TYLENOL) 650 MG CR TABLET    Take 650 mg by mouth. Take one every 6 hours as needed for pain   ASPIRIN 81 MG CHEWABLE TABLET    Chew 81 mg by mouth daily.   ATORVASTATIN (LIPITOR) 10 MG TABLET    Take 10 mg by mouth every evening.   CALCIUM-VITAMIN D (OSCAL WITH D) 500-200 MG-UNIT PER TABLET    Take 1 tablet by mouth 3 (three) times daily.   EXELON 4.6 MG/24HR    APPLY 1 PATCH DAILY.   FISH OIL-OMEGA-3 FATTY ACIDS 1000 MG CAPSULE    Take 1 g by mouth daily.   HYDROCHLOROTHIAZIDE (HYDRODIURIL) 12.5 MG TABLET    Take 12.5 mg by mouth daily.   ISOSORBIDE MONONITRATE (IMDUR) 60 MG 24 HR TABLET    Take 30 mg by mouth daily.   MEMANTINE (NAMENDA) 10 MG TABLET    Take 10 mg by mouth 2 (two) times daily. For memory   METOPROLOL (LOPRESSOR) 50 MG TABLET    Take 50 mg by mouth daily.   MIRTAZAPINE (REMERON) 7.5 MG TABLET    Take 7.5 mg by mouth at bedtime.   MULTIPLE VITAMINS-MINERALS (CERTAVITE SENIOR/ANTIOXIDANT PO)    Take 1 tablet by mouth daily.   OMEPRAZOLE-SODIUM BICARBONATE (ZEGERID) 40-1100 MG PER CAPSULE    Take 1 capsule by mouth 2 (two) times daily.   POLYETHYLENE GLYCOL (MIRALAX / GLYCOLAX) PACKET    Take 17 g by mouth daily.   TRAMADOL (ULTRAM) 50 MG TABLET    Take by mouth. One every 4 hours as needed fir oaub  Modified Medications   No medications on file  Discontinued Medications   No medications on file     Physical Exam: Physical Exam   Constitutional: She appears well-developed and well-nourished.  HENT:  Head: Normocephalic and atraumatic.  Eyes: EOM are normal. Pupils are equal, round, and reactive to light. Right eye exhibits no discharge. Left eye exhibits no discharge.  Neck: Normal range of motion. Neck supple. No JVD present. No thyromegaly present.  Cardiovascular: Normal rate and regular rhythm.   No murmur heard. Pulmonary/Chest: No respiratory distress. She has no wheezes. She has no rales.  Scattered coarse rhonchi.   Abdominal: Soft. Bowel sounds are normal. She exhibits no distension. There  is no tenderness.  Musculoskeletal: Normal range of motion. She exhibits no edema and no tenderness.  Lymphadenopathy:    She has no cervical adenopathy.  Neurological: She is alert. She has normal reflexes. No cranial nerve deficit. She exhibits normal muscle tone. Coordination normal.  Skin: Skin is warm and dry. No rash noted. No erythema.  A skin lesion, scaly, non-healing about a quarter size right on her hairline of the right forehead.     Filed Vitals:   05/11/13 1158  BP: 134/68  Pulse: 65  Temp: 97.9 F (36.6 C)  TempSrc: Tympanic  Resp: 19      Labs reviewed: Basic Metabolic Panel:  Recent Labs  16/10/96  NA 142  K 4.0  BUN 23*  CREATININE 1.0  TSH 2.91    CBC:  Recent Labs  07/19/12  WBC 8.3  HGB 13.1  HCT 39  PLT 198    Past Procedures:  03/25/12 X-ray R+L knees, lumbar spine, pelvis: degenerative spondylosis. No acute fracture, malalignment, lytic destructive lesion, or vertebral body compression deformity. Extensive atherosclerotic vascular calcifications at abdominal aorta and branches.Mild degenerative irregularity at the ischial tuberosity regions with minimal osteoarthritis at the sacroiliac joints. Small degenerative spurs at rhe anterior patella. Slight old healed fracture deformity with orthopedic rods and scres in place at rhe distal shaft and metaphysis of the femur.    05/04/12 CXR minimal cardiomegaly without pulmonary vascular congestion or pleural effusion. Patchy density at the right lower lung likely atelectasis or less pneumonitis.    Assessment/Plan CAP (community acquired pneumonia) Congestive cough, CXR 05/04/13 showed left lower lung pneumonitis, total 10 day course of Avelox 400mg  daily started 05/07/13, improving.   Dementia Functional adequately in AL, continue Exelon 4.6mg  patch and Namenda. Staff reported the patient's seems more confused than her usual state. No focal neurological deficits noted. Will obtain BMP since her oral intake has not been optimal since onset of PNA. UA 05/05/13 showed 70,000c/ml lactobacillus-denied dysuria or urinary or CVA tenderness.         CAD (coronary artery disease) of artery bypass graft No angina since last visit, takes Imdur 30mg , ASA 81mg , Atorvastatin 10mg , Fish oil        Essential hypertension, benign Controlled with mildly elevated SBP 140s, takes Metoprolol 50mg  daily and HCTZ 12.5mg  daily        GERD (gastroesophageal reflux disease) Stable on Zegerid daily.     Insomnia Sleeps better with Mirtazapine 7.5mg  nightly      Unspecified constipation Stable on MiraLax daily.         Family/ Staff Communication: observe the patient.   Goals of Care: [place]  Labs/tests ordered: BMP pending.

## 2013-05-11 NOTE — Assessment & Plan Note (Signed)
Controlled with mildly elevated SBP 140s, takes Metoprolol 50mg  daily and HCTZ 12.5mg  daily

## 2013-05-11 NOTE — Assessment & Plan Note (Signed)
Sleeps better with Mirtazapine 7.5mg nightly  

## 2013-05-13 LAB — BASIC METABOLIC PANEL
BUN: 15 mg/dL (ref 4–21)
BUN: 15 mg/dL (ref 4–21)
CREATININE: 1.2 mg/dL — AB (ref 0.5–1.1)
Creatinine: 1.2 mg/dL — AB (ref 0.5–1.1)
GLUCOSE: 95 mg/dL
Glucose: 95 mg/dL
POTASSIUM: 3.8 mmol/L (ref 3.4–5.3)
Potassium: 3.8 mmol/L (ref 3.4–5.3)
Sodium: 129 mmol/L — AB (ref 137–147)
Sodium: 129 mmol/L — AB (ref 137–147)

## 2013-05-15 ENCOUNTER — Encounter: Payer: Self-pay | Admitting: Nurse Practitioner

## 2013-05-15 ENCOUNTER — Non-Acute Institutional Stay (SKILLED_NURSING_FACILITY): Payer: Medicare Other | Admitting: Nurse Practitioner

## 2013-05-15 DIAGNOSIS — J189 Pneumonia, unspecified organism: Secondary | ICD-10-CM

## 2013-05-15 DIAGNOSIS — M169 Osteoarthritis of hip, unspecified: Secondary | ICD-10-CM

## 2013-05-15 DIAGNOSIS — K219 Gastro-esophageal reflux disease without esophagitis: Secondary | ICD-10-CM

## 2013-05-15 DIAGNOSIS — G4700 Insomnia, unspecified: Secondary | ICD-10-CM

## 2013-05-15 DIAGNOSIS — I10 Essential (primary) hypertension: Secondary | ICD-10-CM

## 2013-05-15 DIAGNOSIS — K5900 Constipation, unspecified: Secondary | ICD-10-CM

## 2013-05-15 DIAGNOSIS — F0390 Unspecified dementia without behavioral disturbance: Secondary | ICD-10-CM

## 2013-05-15 DIAGNOSIS — M1612 Unilateral primary osteoarthritis, left hip: Secondary | ICD-10-CM

## 2013-05-15 DIAGNOSIS — E871 Hypo-osmolality and hyponatremia: Secondary | ICD-10-CM

## 2013-05-15 DIAGNOSIS — M1610 Unilateral primary osteoarthritis, unspecified hip: Secondary | ICD-10-CM

## 2013-05-15 NOTE — Assessment & Plan Note (Signed)
Stable on Zegerid daily.    

## 2013-05-15 NOTE — Progress Notes (Signed)
Patient ID: Joann Baker, female   DOB: 08/16/1924, 80 y.o.   MRN: [id number]   Code Status: DNR  Allergies  Allergen Reactions  . Alendronate Sodium     Per mar  . Budesonide-Formoterol Fumarate     Per mar  . Fluticasone Propionate (Inhal)     Per mar  . Mometasone Furoate     Per mar  . Omeprazole     Per mar  . Valium [Diazepam]     Chief Complaint  Patient presents with  . Medical Managment of Chronic Issues    PNA, hyponatremia.   . Acute Visit    HPI: Patient is a 80 y.o. female [facility] for evaluation of hyponatremia, PNA,  and other chronic medical conditions.  Problem List Items Addressed This Visit   CAP (community acquired pneumonia)     Congestive cough, CXR 05/04/13 showed left lower lung pneumonitis, total 10 day course of Avelox 400mg  daily started 05/07/13, improving.       Dementia (Chronic)     Functional adequately in AL, continue Exelon 4.6mg  patch and Namenda. Staff reported the patient's seems more confused than her usual state. No focal neurological deficits noted. Multiple factorials: dementia, Hyponatremia, Avelox, PNA-continue to observe the patient while correcting sodium, treating PNA.             Essential hypertension, benign (Chronic)     elevated SBP,  takes Metoprolol 50mg  daily dc HCTZ 12.5mg  daily, monitor Bp daily.             GERD (gastroesophageal reflux disease)     Stable on Zegerid daily.         Hyponatremia     Na 134 05/13/13-129 05/13/13, s/p D5 1/2NS x2000cc, oral intake is improved, repeat BMP, dc HCTZ for now. Monitor Bp, edema, weight.     Insomnia     Sleeps better with Mirtazapine 7.5mg  nightly          Osteoarthritis of left hip     Tylenol and Tramadol prn available to her.     Unspecified constipation - Primary     Stable on MiraLax daily.            Review of Systems:  Review of Systems  Constitutional: Negative for fever, chills, weight  loss, malaise/fatigue and diaphoresis.  HENT: Positive for hearing loss. Negative for congestion, ear discharge, ear pain, nosebleeds and sore throat.   Eyes: Negative for pain, discharge and redness.  Respiratory: Positive for cough. Negative for sputum production, shortness of breath and wheezing.   Cardiovascular: Negative for chest pain, palpitations, claudication, leg swelling and PND.  Gastrointestinal: Negative for heartburn, nausea, vomiting, abdominal pain, diarrhea, constipation and blood in stool.  Genitourinary: Positive for frequency. Negative for dysuria, urgency and hematuria.  Musculoskeletal: Positive for back pain. Negative for joint pain, myalgias and neck pain.  Skin: Negative for itching and rash.       The right forehead skin lesion right on her hairline, scaly, non healing.   Neurological: Negative for dizziness, tremors, sensory change, speech change, focal weakness, seizures, loss of consciousness and weakness.  Endo/Heme/Allergies: Negative for environmental allergies and polydipsia. Does not bruise/bleed easily.  Psychiatric/Behavioral: Positive for memory loss. Negative for depression and hallucinations. The patient is not nervous/anxious and does not have insomnia.        New onset of confusion since the PNA     Past Medical History  Diagnosis Date  .  Hypertension   . DJD (degenerative joint disease)   . Hyperlipidemia   . Coronary atherosclerosis of native coronary artery   . Adjustment disorder with depressed mood   . Dementia in conditions classified elsewhere without behavioral disturbance   . Right bundle branch block   . Asthma   . GERD (gastroesophageal reflux disease)   . Unspecified constipation   . Pain in joint, pelvic region and thigh     left  . Osteoporosis   . Unspecified urinary incontinence   . Anemia   . Depression   . Edema   . Urinary tract infection, site not specified    Past Surgical History  Procedure Laterality Date  .  Femur im nail  06/19/2011    Procedure: INTRAMEDULLARY (IM) NAIL FEMORAL;  Surgeon: Harvie Junior, MD;  Location: [facility] OR;  Service: Orthopedics;  Laterality: Left;  . Abdominal hysterectomy    . Cholecystectomy    . Skin cancer excision  02/15/2013    right forehead Dr. Irene Limbo   Social History:   reports that she has never smoked. She has never used smokeless tobacco. She reports that she does not drink alcohol or use illicit drugs.  Medications: Patient's Medications  New Prescriptions   No medications on file  Previous Medications   ACETAMINOPHEN (TYLENOL) 650 MG CR TABLET    Take 650 mg by mouth. Take one every 6 hours as needed for pain   ASPIRIN 81 MG CHEWABLE TABLET    Chew 81 mg by mouth daily.   ATORVASTATIN (LIPITOR) 10 MG TABLET    Take 10 mg by mouth every evening.   CALCIUM-VITAMIN D (OSCAL WITH D) 500-200 MG-UNIT PER TABLET    Take 1 tablet by mouth 3 (three) times daily.   EXELON 4.6 MG/24HR    APPLY 1 PATCH DAILY.   FISH OIL-OMEGA-3 FATTY ACIDS 1000 MG CAPSULE    Take 1 g by mouth daily.   HYDROCHLOROTHIAZIDE (HYDRODIURIL) 12.5 MG TABLET    Take 12.5 mg by mouth daily.   ISOSORBIDE MONONITRATE (IMDUR) 60 MG 24 HR TABLET    Take 30 mg by mouth daily.   MEMANTINE (NAMENDA) 10 MG TABLET    Take 10 mg by mouth 2 (two) times daily. For memory   METOPROLOL (LOPRESSOR) 50 MG TABLET    Take 50 mg by mouth daily.   MIRTAZAPINE (REMERON) 7.5 MG TABLET    Take 7.5 mg by mouth at bedtime.   MULTIPLE VITAMINS-MINERALS (CERTAVITE SENIOR/ANTIOXIDANT PO)    Take 1 tablet by mouth daily.   OMEPRAZOLE-SODIUM BICARBONATE (ZEGERID) 40-1100 MG PER CAPSULE    Take 1 capsule by mouth 2 (two) times daily.   POLYETHYLENE GLYCOL (MIRALAX / GLYCOLAX) PACKET    Take 17 g by mouth daily.   TRAMADOL (ULTRAM) 50 MG TABLET    Take by mouth. One every 4 hours as needed fir oaub  Modified Medications   No medications on file  Discontinued Medications   No medications on file     Physical  Exam: Physical Exam  Constitutional: She appears well-developed and well-nourished.  HENT:  Head: Normocephalic and atraumatic.  Eyes: EOM are normal. Pupils are equal, round, and reactive to light. Right eye exhibits no discharge. Left eye exhibits no discharge.  Neck: Normal range of motion. Neck supple. No JVD present. No thyromegaly present.  Cardiovascular: Normal rate and regular rhythm.   No murmur heard. Pulmonary/Chest: No respiratory distress. She has no wheezes. She has no rales.  Scattered  coarse rhonchi.   Abdominal: Soft. Bowel sounds are normal. She exhibits no distension. There is no tenderness.  Musculoskeletal: Normal range of motion. She exhibits no edema and no tenderness.  Lymphadenopathy:    She has no cervical adenopathy.  Neurological: She is alert. She has normal reflexes. No cranial nerve deficit. She exhibits normal muscle tone. Coordination normal.  Skin: Skin is warm and dry. No rash noted. No erythema.  A skin lesion, scaly, non-healing about a quarter size right on her hairline of the right forehead.     Filed Vitals:   05/15/13 1404  BP: 154/67  Pulse: 71  Temp: 98.2 F (36.8 C)  TempSrc: Tympanic  Resp: 18      Labs reviewed: Basic Metabolic Panel:  Recent Labs  16/10/96 05/13/13  NA 142 129*  K 4.0 3.8  BUN 23* 15  CREATININE 1.0 1.2*  TSH 2.91  --    CBC:  Recent Labs  07/19/12  WBC 8.3  HGB 13.1  HCT 39  PLT 198   Past Procedures:  03/25/12 X-ray R+L knees, lumbar spine, pelvis: degenerative spondylosis. No acute fracture, malalignment, lytic destructive lesion, or vertebral body compression deformity. Extensive atherosclerotic vascular calcifications at abdominal aorta and branches.Mild degenerative irregularity at the ischial tuberosity regions with minimal osteoarthritis at the sacroiliac joints. Small degenerative spurs at rhe anterior patella. Slight old healed fracture deformity with orthopedic rods and scres in place at  rhe distal shaft and metaphysis of the femur.   05/04/12 CXR minimal cardiomegaly without pulmonary vascular congestion or pleural effusion. Patchy density at the right lower lung likely atelectasis or less pneumonitis.   Assessment/Plan Unspecified constipation Stable on MiraLax daily.       Osteoarthritis of left hip Tylenol and Tramadol prn available to her.   Insomnia Sleeps better with Mirtazapine 7.5mg  nightly        GERD (gastroesophageal reflux disease) Stable on Zegerid daily.       Essential hypertension, benign elevated SBP,  takes Metoprolol 50mg  daily dc HCTZ 12.5mg  daily, monitor Bp daily.           Dementia Functional adequately in AL, continue Exelon 4.6mg  patch and Namenda. Staff reported the patient's seems more confused than her usual state. No focal neurological deficits noted. Multiple factorials: dementia, Hyponatremia, Avelox, PNA-continue to observe the patient while correcting sodium, treating PNA.           CAP (community acquired pneumonia) Congestive cough, CXR 05/04/13 showed left lower lung pneumonitis, total 10 day course of Avelox 400mg  daily started 05/07/13, improving.     Hyponatremia Na 134 05/13/13-129 05/13/13, s/p D5 1/2NS x2000cc, oral intake is improved, repeat BMP, dc HCTZ for now. Monitor Bp, edema, weight.     Family/ Staff Communication: observe the patient  Goals of Care: AL  Labs/tests ordered: BMP

## 2013-05-15 NOTE — Assessment & Plan Note (Signed)
Tylenol and Tramadol prn available to her  

## 2013-05-15 NOTE — Assessment & Plan Note (Signed)
Sleeps better with Mirtazapine 7.5mg nightly  

## 2013-05-15 NOTE — Assessment & Plan Note (Signed)
Stable on MiraLax daily.  

## 2013-05-15 NOTE — Assessment & Plan Note (Signed)
Functional adequately in AL, continue Exelon 4.6mg  patch and Namenda. Staff reported the patient's seems more confused than her usual state. No focal neurological deficits noted. Multiple factorials: dementia, Hyponatremia, Avelox, PNA-continue to observe the patient while correcting sodium, treating PNA.

## 2013-05-15 NOTE — Assessment & Plan Note (Addendum)
Na 134 05/13/13-129 05/13/13, s/p D5 1/2NS x2000cc, oral intake is improved, repeat BMP, dc HCTZ for now. Monitor Bp, edema, weight.

## 2013-05-15 NOTE — Assessment & Plan Note (Signed)
elevated SBP,  takes Metoprolol 50mg  daily dc HCTZ 12.5mg  daily, monitor Bp daily.

## 2013-05-15 NOTE — Assessment & Plan Note (Signed)
Congestive cough, CXR 05/04/13 showed left lower lung pneumonitis, total 10 day course of Avelox 400mg  daily started 05/07/13, improving.

## 2013-05-16 LAB — BASIC METABOLIC PANEL
BUN: 17 mg/dL (ref 4–21)
Creatinine: 1 mg/dL (ref 0.5–1.1)
Glucose: 98 mg/dL
POTASSIUM: 3.6 mmol/L (ref 3.4–5.3)
SODIUM: 141 mmol/L (ref 137–147)

## 2013-06-12 ENCOUNTER — Encounter: Payer: Self-pay | Admitting: Nurse Practitioner

## 2013-06-12 ENCOUNTER — Non-Acute Institutional Stay (SKILLED_NURSING_FACILITY): Payer: Medicare Other | Admitting: Nurse Practitioner

## 2013-06-12 DIAGNOSIS — K5900 Constipation, unspecified: Secondary | ICD-10-CM

## 2013-06-12 DIAGNOSIS — I25810 Atherosclerosis of coronary artery bypass graft(s) without angina pectoris: Secondary | ICD-10-CM

## 2013-06-12 DIAGNOSIS — M169 Osteoarthritis of hip, unspecified: Secondary | ICD-10-CM

## 2013-06-12 DIAGNOSIS — E871 Hypo-osmolality and hyponatremia: Secondary | ICD-10-CM

## 2013-06-12 DIAGNOSIS — M1612 Unilateral primary osteoarthritis, left hip: Secondary | ICD-10-CM

## 2013-06-12 DIAGNOSIS — G4700 Insomnia, unspecified: Secondary | ICD-10-CM

## 2013-06-12 DIAGNOSIS — F0390 Unspecified dementia without behavioral disturbance: Secondary | ICD-10-CM

## 2013-06-12 DIAGNOSIS — I10 Essential (primary) hypertension: Secondary | ICD-10-CM

## 2013-06-12 DIAGNOSIS — M1610 Unilateral primary osteoarthritis, unspecified hip: Secondary | ICD-10-CM

## 2013-06-12 DIAGNOSIS — J189 Pneumonia, unspecified organism: Secondary | ICD-10-CM

## 2013-06-12 DIAGNOSIS — K219 Gastro-esophageal reflux disease without esophagitis: Secondary | ICD-10-CM

## 2013-06-12 NOTE — Assessment & Plan Note (Signed)
Stable on MiraLax daily.  

## 2013-06-12 NOTE — Assessment & Plan Note (Signed)
No angina since last visit, takes Imdur 30mg, ASA 81mg, Atorvastatin 10mg, Fish oil   

## 2013-06-12 NOTE — Assessment & Plan Note (Signed)
Functional adequately in AL, continue Exelon 4.6mg  patch and Namenda. Her mentation has returned to her baseline after PNA has been fully treated with Avelox and sodium was corrected.

## 2013-06-12 NOTE — Assessment & Plan Note (Signed)
controlled,  takes Metoprolol 50mg daily, off HCTZ 12.5mg daily, monitor Bp daily.     

## 2013-06-12 NOTE — Assessment & Plan Note (Signed)
Congestive cough, CXR 05/04/13 showed left lower lung pneumonitis, total 10 day course of Avelox 400mg  daily started 05/07/13, healed.

## 2013-06-12 NOTE — Assessment & Plan Note (Signed)
Stable on Zegerid daily.    

## 2013-06-12 NOTE — Assessment & Plan Note (Addendum)
Na 134 05/13/13-129 05/13/13, s/p D5 1/2NS x2000cc, oral intake is improved, off HCTZ for now. Monitor Bp, edema, weight-stable. Na 141 05/16/13

## 2013-06-12 NOTE — Assessment & Plan Note (Signed)
Sleeps better with Mirtazapine 7.5mg nightly  

## 2013-06-12 NOTE — Assessment & Plan Note (Signed)
Tylenol and Tramadol prn available to he  

## 2013-06-12 NOTE — Progress Notes (Signed)
Patient ID: Joann Baker, female   DOB: 02-Jun-1924, 80 y.o.   MRN: [id number]   Code Status: DNR  Allergies  Allergen Reactions  . Alendronate Sodium     Per mar  . Budesonide-Formoterol Fumarate     Per mar  . Fluticasone Propionate (Inhal)     Per mar  . Mometasone Furoate     Per mar  . Omeprazole     Per mar  . Valium [Diazepam]     Chief Complaint  Patient presents with  . Medical Managment of Chronic Issues    HPI: Patient is a 80 y.o. female seen in the SNF at [facility] today for evaluation of hyponatremia, PNA,  and other chronic medical conditions.  Problem List Items Addressed This Visit   Unspecified constipation     Stable on MiraLax daily.      Osteoarthritis of left hip     Tylenol and Tramadol prn available to he    Insomnia     Sleeps better with Mirtazapine 7.5mg  nightly      Hyponatremia     Na 134 05/13/13-129 05/13/13, s/p D5 1/2NS x2000cc, oral intake is improved, off HCTZ for now. Monitor Bp, edema, weight-stable. Na 141 05/16/13    GERD (gastroesophageal reflux disease)     Stable on Zegerid daily.      Essential hypertension, benign (Chronic)     controlled,  takes Metoprolol 50mg  daily, off HCTZ 12.5mg  daily, monitor Bp daily.       Dementia - Primary (Chronic)     Functional adequately in AL, continue Exelon 4.6mg  patch and Namenda. Her mentation has returned to her baseline after PNA has been fully treated with Avelox and sodium was corrected.      CAP (community acquired pneumonia)     Congestive cough, CXR 05/04/13 showed left lower lung pneumonitis, total 10 day course of Avelox 400mg  daily started 05/07/13, healed.         CAD (coronary artery disease) of artery bypass graft     No angina since last visit, takes Imdur 30mg , ASA 81mg , Atorvastatin 10mg , Fish oil       Review of Systems:  Review of Systems  Constitutional: Negative for fever, chills, weight loss, malaise/fatigue and diaphoresis.  HENT: Positive  for hearing loss. Negative for congestion, ear discharge, ear pain, nosebleeds and sore throat.   Eyes: Negative for pain, discharge and redness.  Respiratory: Positive for cough. Negative for sputum production, shortness of breath and wheezing.   Cardiovascular: Negative for chest pain, palpitations, claudication, leg swelling and PND.  Gastrointestinal: Negative for heartburn, nausea, vomiting, abdominal pain, diarrhea, constipation and blood in stool.  Genitourinary: Positive for frequency. Negative for dysuria, urgency and hematuria.  Musculoskeletal: Positive for back pain. Negative for joint pain, myalgias and neck pain.  Skin: Negative for itching and rash.       The right forehead skin lesion right on her hairline, scaly, non healing.   Neurological: Negative for dizziness, tremors, sensory change, speech change, focal weakness, seizures, loss of consciousness and weakness.  Endo/Heme/Allergies: Negative for environmental allergies and polydipsia. Does not bruise/bleed easily.  Psychiatric/Behavioral: Positive for memory loss. Negative for depression and hallucinations. The patient is not nervous/anxious and does not have insomnia.        New onset of confusion since the PNA     Past Medical History  Diagnosis Date  . Hypertension   . DJD (degenerative joint disease)   . Hyperlipidemia   .  Coronary atherosclerosis of native coronary artery   . Adjustment disorder with depressed mood   . Dementia in conditions classified elsewhere without behavioral disturbance   . Right bundle branch block   . Asthma   . GERD (gastroesophageal reflux disease)   . Unspecified constipation   . Pain in joint, pelvic region and thigh     left  . Osteoporosis   . Unspecified urinary incontinence   . Anemia   . Depression   . Edema   . Urinary tract infection, site not specified    Past Surgical History  Procedure Laterality Date  . Femur im nail  06/19/2011    Procedure: INTRAMEDULLARY (IM)  NAIL FEMORAL;  Surgeon: Harvie JuniorJohn L Graves, MD;  Location: [facility] OR;  Service: Orthopedics;  Laterality: Left;  . Abdominal hysterectomy    . Cholecystectomy    . Skin cancer excision  02/15/2013    right forehead Dr. Irene LimboGoodrich   Social History:   reports that she has never smoked. She has never used smokeless tobacco. She reports that she does not drink alcohol or use illicit drugs.  Medications: Patient's Medications  New Prescriptions   No medications on file  Previous Medications   ACETAMINOPHEN (TYLENOL) 650 MG CR TABLET    Take 650 mg by mouth. Take one every 6 hours as needed for pain   ASPIRIN 81 MG CHEWABLE TABLET    Chew 81 mg by mouth daily.   ATORVASTATIN (LIPITOR) 10 MG TABLET    Take 10 mg by mouth every evening.   CALCIUM-VITAMIN D (OSCAL WITH D) 500-200 MG-UNIT PER TABLET    Take 1 tablet by mouth 3 (three) times daily.   EXELON 4.6 MG/24HR    APPLY 1 PATCH DAILY.   FISH OIL-OMEGA-3 FATTY ACIDS 1000 MG CAPSULE    Take 1 g by mouth daily.   HYDROCHLOROTHIAZIDE (HYDRODIURIL) 12.5 MG TABLET    Take 12.5 mg by mouth daily.   ISOSORBIDE MONONITRATE (IMDUR) 60 MG 24 HR TABLET    Take 30 mg by mouth daily.   MEMANTINE (NAMENDA) 10 MG TABLET    Take 10 mg by mouth 2 (two) times daily. For memory   METOPROLOL (LOPRESSOR) 50 MG TABLET    Take 50 mg by mouth daily.   MIRTAZAPINE (REMERON) 7.5 MG TABLET    Take 7.5 mg by mouth at bedtime.   MULTIPLE VITAMINS-MINERALS (CERTAVITE SENIOR/ANTIOXIDANT PO)    Take 1 tablet by mouth daily.   OMEPRAZOLE-SODIUM BICARBONATE (ZEGERID) 40-1100 MG PER CAPSULE    Take 1 capsule by mouth 2 (two) times daily.   POLYETHYLENE GLYCOL (MIRALAX / GLYCOLAX) PACKET    Take 17 g by mouth daily.   TRAMADOL (ULTRAM) 50 MG TABLET    Take by mouth. One every 4 hours as needed fir oaub  Modified Medications   No medications on file  Discontinued Medications   No medications on file     Physical Exam: Physical Exam  Constitutional: She appears well-developed and  well-nourished.  HENT:  Head: Normocephalic and atraumatic.  Eyes: EOM are normal. Pupils are equal, round, and reactive to light. Right eye exhibits no discharge. Left eye exhibits no discharge.  Neck: Normal range of motion. Neck supple. No JVD present. No thyromegaly present.  Cardiovascular: Normal rate and regular rhythm.   No murmur heard. Pulmonary/Chest: No respiratory distress. She has no wheezes. She has no rales.  Scattered coarse rhonchi.   Abdominal: Soft. Bowel sounds are normal. She exhibits no distension. There  is no tenderness.  Musculoskeletal: Normal range of motion. She exhibits no edema and no tenderness.  Lymphadenopathy:    She has no cervical adenopathy.  Neurological: She is alert. She has normal reflexes. No cranial nerve deficit. She exhibits normal muscle tone. Coordination normal.  Skin: Skin is warm and dry. No rash noted. No erythema.  A skin lesion, scaly, non-healing about a quarter size right on her hairline of the right forehead.     Filed Vitals:   06/12/13 1701  BP: 147/73  Pulse: 60  Temp: 97.8 F (36.6 C)  TempSrc: Tympanic  Resp: 20      Labs reviewed: Basic Metabolic Panel:  Recent Labs  40/98/11 05/13/13 05/16/13  NA 142 129*  129* 141  K 4.0 3.8  3.8 3.6  BUN 23* 15  15 17   CREATININE 1.0 1.2*  1.2* 1.0  TSH 2.91  --   --    CBC:  Recent Labs  07/19/12  WBC 8.3  HGB 13.1  HCT 39  PLT 198   Past Procedures:  03/25/12 X-ray R+L knees, lumbar spine, pelvis: degenerative spondylosis. No acute fracture, malalignment, lytic destructive lesion, or vertebral body compression deformity. Extensive atherosclerotic vascular calcifications at abdominal aorta and branches.Mild degenerative irregularity at the ischial tuberosity regions with minimal osteoarthritis at the sacroiliac joints. Small degenerative spurs at rhe anterior patella. Slight old healed fracture deformity with orthopedic rods and scres in place at rhe distal shaft  and metaphysis of the femur.   05/04/12 CXR minimal cardiomegaly without pulmonary vascular congestion or pleural effusion. Patchy density at the right lower lung likely atelectasis or less pneumonitis.   Assessment/Plan Dementia Functional adequately in AL, continue Exelon 4.6mg  patch and Namenda. Her mentation has returned to her baseline after PNA has been fully treated with Avelox and sodium was corrected.    Essential hypertension, benign controlled,  takes Metoprolol 50mg  daily, off HCTZ 12.5mg  daily, monitor Bp daily.     Osteoarthritis of left hip Tylenol and Tramadol prn available to he  Insomnia Sleeps better with Mirtazapine 7.5mg  nightly    CAD (coronary artery disease) of artery bypass graft No angina since last visit, takes Imdur 30mg , ASA 81mg , Atorvastatin 10mg , Fish oil  Unspecified constipation Stable on MiraLax daily.    GERD (gastroesophageal reflux disease) Stable on Zegerid daily.    CAP (community acquired pneumonia) Congestive cough, CXR 05/04/13 showed left lower lung pneumonitis, total 10 day course of Avelox 400mg  daily started 05/07/13, healed.       Hyponatremia Na 134 05/13/13-129 05/13/13, s/p D5 1/2NS x2000cc, oral intake is improved, off HCTZ for now. Monitor Bp, edema, weight-stable. Na 141 05/16/13    Family/ Staff Communication: observe the patient  Goals of Care: AL  Labs/tests ordered: none

## 2013-06-23 ENCOUNTER — Non-Acute Institutional Stay (SKILLED_NURSING_FACILITY): Payer: Medicare Other | Admitting: Internal Medicine

## 2013-06-23 DIAGNOSIS — Z9181 History of falling: Secondary | ICD-10-CM

## 2013-06-23 DIAGNOSIS — I10 Essential (primary) hypertension: Secondary | ICD-10-CM

## 2013-06-23 DIAGNOSIS — F0280 Dementia in other diseases classified elsewhere without behavioral disturbance: Secondary | ICD-10-CM

## 2013-06-23 DIAGNOSIS — K5900 Constipation, unspecified: Secondary | ICD-10-CM

## 2013-06-23 DIAGNOSIS — M1990 Unspecified osteoarthritis, unspecified site: Secondary | ICD-10-CM

## 2013-06-23 DIAGNOSIS — E785 Hyperlipidemia, unspecified: Secondary | ICD-10-CM

## 2013-06-23 DIAGNOSIS — D649 Anemia, unspecified: Secondary | ICD-10-CM

## 2013-06-23 DIAGNOSIS — I2510 Atherosclerotic heart disease of native coronary artery without angina pectoris: Secondary | ICD-10-CM

## 2013-07-02 ENCOUNTER — Encounter: Payer: Self-pay | Admitting: Internal Medicine

## 2013-07-02 DIAGNOSIS — M1990 Unspecified osteoarthritis, unspecified site: Secondary | ICD-10-CM | POA: Insufficient documentation

## 2013-07-02 DIAGNOSIS — K5909 Other constipation: Secondary | ICD-10-CM | POA: Insufficient documentation

## 2013-07-02 DIAGNOSIS — I2510 Atherosclerotic heart disease of native coronary artery without angina pectoris: Secondary | ICD-10-CM | POA: Insufficient documentation

## 2013-07-02 DIAGNOSIS — D638 Anemia in other chronic diseases classified elsewhere: Secondary | ICD-10-CM | POA: Insufficient documentation

## 2013-07-02 DIAGNOSIS — Z9181 History of falling: Secondary | ICD-10-CM | POA: Insufficient documentation

## 2013-07-02 DIAGNOSIS — I10 Essential (primary) hypertension: Secondary | ICD-10-CM | POA: Insufficient documentation

## 2013-07-02 DIAGNOSIS — E785 Hyperlipidemia, unspecified: Secondary | ICD-10-CM | POA: Insufficient documentation

## 2013-07-02 NOTE — Progress Notes (Signed)
Patient ID: Joann Baker, female   DOB: [birth year], 80 y.o.   MRN: [id number]    Location:  [facility] , room 917  Place of Service: ALF (13)  PCP: Kimber Relic, MD  Code Status: DO NOT RESUSCITATE  Extended Emergency Contact Information Primary Emergency Contact: [facility] Address: [address] [place]          [name],  [address] Home Phone: [phone] Relation: None  Allergies  Allergen Reactions  . Alendronate Sodium     Per mar  . Budesonide-Formoterol Fumarate     Per mar  . Fluticasone Propionate (Inhal)     Per mar  . Mometasone Furoate     Per mar  . Omeprazole     Per mar  . Valium [Diazepam]     Chief Complaint  Patient presents with  . Medical Managment of Chronic Issues    Transferred to assisted living 06/14/13  . Dementia    HPI:  This patient had been living independently until recently she was put in the skilled care area for pneumonia and has been moved to the assisted living area of [facility] on 06/14/48. Progression of her dementia and gait instability with a high risk for falls makes this likely to be a long-term care admission.    Past Medical History  Diagnosis Date  . Hypertension   . DJD (degenerative joint disease)   . Hyperlipidemia   . Coronary atherosclerosis of native coronary artery   . Adjustment disorder with depressed mood   . Dementia in conditions classified elsewhere without behavioral disturbance   . Right bundle branch block   . Asthma   . GERD (gastroesophageal reflux disease)   . Unspecified constipation   . Pain in joint, pelvic region and thigh     left  . Osteoporosis   . Unspecified urinary incontinence   . Anemia   . Depression   . Edema   . Urinary tract infection, site not specified   . History of fall   . Constipation   . Insomnia     Past Surgical History  Procedure Laterality Date  . Femur im nail  06/19/2011    Procedure: INTRAMEDULLARY (IM) NAIL FEMORAL;  Surgeon: Harvie Junior, MD;  Location: [facility] OR;  Service: Orthopedics;  Laterality: Left;  . Abdominal hysterectomy    . Cholecystectomy    . Skin cancer excision  02/15/2013    right forehead Dr. Irene Limbo     Social History: History   Social History  . Marital Status: Widowed    Spouse Name: N/A    Number of Children: N/A  . Years of Education: N/A   Social History Main Topics  . Smoking status: Never Smoker   . Smokeless tobacco: Never Used  . Alcohol Use: No  . Drug Use: No  . Sexual Activity: No   Other Topics Concern  . None   Social History Narrative   Lives at [facility] [place]    Family History Family Status  Relation Status Death Age  . Sister Deceased   . Brother Deceased   . Daughter Alive   . Son Alive    No family history on file.   Medications: Patient's Medications  New Prescriptions   No medications on file  Previous Medications   ACETAMINOPHEN (TYLENOL) 650 MG CR TABLET    Take 650 mg by mouth. Take one every 6 hours as needed for pain   ASPIRIN 81 MG CHEWABLE  TABLET    Chew 81 mg by mouth daily.   ATORVASTATIN (LIPITOR) 10 MG TABLET    Take 10 mg by mouth every evening.   CALCIUM-VITAMIN D (OSCAL WITH D) 500-200 MG-UNIT PER TABLET    Take 1 tablet by mouth 3 (three) times daily.   EXELON 4.6 MG/24HR    APPLY 1 PATCH DAILY.   FISH OIL-OMEGA-3 FATTY ACIDS 1000 MG CAPSULE    Take 1 g by mouth daily.   HYDROCHLOROTHIAZIDE (HYDRODIURIL) 12.5 MG TABLET    Take 12.5 mg by mouth daily.   ISOSORBIDE MONONITRATE (IMDUR) 60 MG 24 HR TABLET    Take 30 mg by mouth daily.   MEMANTINE (NAMENDA) 10 MG TABLET    Take 10 mg by mouth 2 (two) times daily. For memory   METOPROLOL (LOPRESSOR) 50 MG TABLET    Take 50 mg by mouth daily.   MIRTAZAPINE (REMERON) 7.5 MG TABLET    Take 7.5 mg by mouth at bedtime.   MULTIPLE VITAMINS-MINERALS (CERTAVITE SENIOR/ANTIOXIDANT PO)    Take 1 tablet by mouth daily.   OMEPRAZOLE-SODIUM BICARBONATE (ZEGERID) 40-1100 MG PER CAPSULE    Take  1 capsule by mouth 2 (two) times daily.   POLYETHYLENE GLYCOL (MIRALAX / GLYCOLAX) PACKET    Take 17 g by mouth daily.   TRAMADOL (ULTRAM) 50 MG TABLET    Take by mouth. One every 4 hours as needed fir oaub  Modified Medications   No medications on file  Discontinued Medications   No medications on file    Immunization History  Administered Date(s) Administered  . Influenza Whole 02/24/2012, 02/23/2013  . Pneumococcal Polysaccharide-23 04/20/2008  . Td 04/21/2003     Review of Systems  Constitutional: Positive for diaphoresis and fatigue. Negative for fever, chills, activity change, appetite change and unexpected weight change.  HENT: Negative.   Eyes: Negative.   Respiratory: Negative.   Cardiovascular: Positive for leg swelling. Negative for chest pain and palpitations.  Gastrointestinal: Negative.   Endocrine: Negative.   Genitourinary: Positive for urgency. Negative for dysuria, frequency, flank pain and pelvic pain.  Musculoskeletal: Positive for arthralgias, back pain and gait problem.  Skin: Positive for wound (removal of skin cancer of the right forehead 02/15/13). Negative for pallor and rash.  Allergic/Immunologic: Negative.   Neurological: Positive for speech difficulty (Difficulty with wored finding.).       Dementia. 09/10/12 MMSE; total 19/30   Hematological: Negative.   Psychiatric/Behavioral: Positive for confusion.      Filed Vitals:   07/02/13 1519  BP: 150/82  Pulse: 64  Temp: 98 F (36.7 C)  Resp: 18  Height: 5\' 2"  (1.575 m)  Weight: 148 lb (67.132 kg)   Physical Exam  Constitutional: She appears well-developed and well-nourished. No distress.  HENT:  Right Ear: External ear normal.  Left Ear: External ear normal.  Nose: Nose normal.  Mouth/Throat: Oropharynx is clear and moist.  Eyes: Conjunctivae and EOM are normal. Pupils are equal, round, and reactive to light.  Neck: No JVD present. No tracheal deviation present. No thyromegaly present.    Cardiovascular: Normal rate, regular rhythm and normal heart sounds.  Exam reveals no gallop and no friction rub.   No murmur heard. Respiratory: No respiratory distress. She has no wheezes. She has no rales. She exhibits no tenderness.  GI: She exhibits no distension and no mass. There is no tenderness.  Genitourinary: Guaiac negative stool. No vaginal discharge found.  Musculoskeletal: Normal range of motion. She exhibits no tenderness.  Lymphadenopathy:    She has no cervical adenopathy.  Neurological: She is alert. No cranial nerve deficit.  Forgetful.  Skin: No rash noted. No erythema. No pallor.  Psychiatric: She has a normal mood and affect. Her behavior is normal. Thought content normal.       Labs reviewed: Nursing Home on 06/12/2013  Component Date Value Ref Range Status  . Glucose 05/16/2013 98   Final  . BUN 05/16/2013 17  4 - 21 mg/dL Final  . Creatinine 98/02/9146 1.0  0.5 - 1.1 mg/dL Final  . Potassium 82/95/6213 3.6  3.4 - 5.3 mmol/L Final  . Sodium 05/16/2013 141  137 - 147 mmol/L Final  . Glucose 05/13/2013 95   Final  . BUN 05/13/2013 15  4 - 21 mg/dL Final  . Creatinine 08/65/7846 1.2* 0.5 - 1.1 mg/dL Final  . Potassium 96/29/5284 3.8  3.4 - 5.3 mmol/L Final  . Sodium 05/13/2013 129* 137 - 147 mmol/L Final  Nursing Home on 05/15/2013  Component Date Value Ref Range Status  . Glucose 05/13/2013 95   Final  . BUN 05/13/2013 15  4 - 21 mg/dL Final  . Creatinine 13/24/4010 1.2* 0.5 - 1.1 mg/dL Final  . Potassium 27/25/3664 3.8  3.4 - 5.3 mmol/L Final  . Sodium 05/13/2013 129* 137 - 147 mmol/L Final     Assessment/Plan 1. Dementia in conditions classified elsewhere without behavioral disturbance Slowly progressive  2. Hypertension Controlled  3. DJD (degenerative joint disease) Contributes to gait instability  4. Hyperlipidemia Followup lab  5. Coronary atherosclerosis of native coronary artery Currently asymptomatic  6. Anemia Followup  7.  History of fall None recently  8. Constipation Chronic condition which is stable.   Because of progression of her dementia and gait instability it is anticipated that this will be a long-term remission.

## 2013-09-07 ENCOUNTER — Encounter: Payer: Self-pay | Admitting: Nurse Practitioner

## 2013-09-07 ENCOUNTER — Non-Acute Institutional Stay: Payer: Medicare Other | Admitting: Nurse Practitioner

## 2013-09-07 ENCOUNTER — Other Ambulatory Visit: Payer: Self-pay | Admitting: Nurse Practitioner

## 2013-09-07 VITALS — BP 152/80 | HR 60 | Wt 152.0 lb

## 2013-09-07 DIAGNOSIS — M1612 Unilateral primary osteoarthritis, left hip: Secondary | ICD-10-CM

## 2013-09-07 DIAGNOSIS — I10 Essential (primary) hypertension: Secondary | ICD-10-CM

## 2013-09-07 DIAGNOSIS — I25810 Atherosclerosis of coronary artery bypass graft(s) without angina pectoris: Secondary | ICD-10-CM

## 2013-09-07 DIAGNOSIS — K5900 Constipation, unspecified: Secondary | ICD-10-CM

## 2013-09-07 DIAGNOSIS — G4700 Insomnia, unspecified: Secondary | ICD-10-CM

## 2013-09-07 DIAGNOSIS — F0280 Dementia in other diseases classified elsewhere without behavioral disturbance: Secondary | ICD-10-CM

## 2013-09-07 DIAGNOSIS — M1610 Unilateral primary osteoarthritis, unspecified hip: Secondary | ICD-10-CM

## 2013-09-07 DIAGNOSIS — K219 Gastro-esophageal reflux disease without esophagitis: Secondary | ICD-10-CM

## 2013-09-07 DIAGNOSIS — M169 Osteoarthritis of hip, unspecified: Secondary | ICD-10-CM

## 2013-09-07 NOTE — Assessment & Plan Note (Signed)
Sleeps better with Mirtazapine 7.5mg nightly  

## 2013-09-07 NOTE — Assessment & Plan Note (Signed)
Stable on Zegerid 40/1.1 bid

## 2013-09-07 NOTE — Assessment & Plan Note (Signed)
Stable on MiraLax daily.  

## 2013-09-07 NOTE — Assessment & Plan Note (Signed)
Functional adequately in AL, continue Namenda.

## 2013-09-07 NOTE — Assessment & Plan Note (Signed)
controlled,  takes Metoprolol 50mg  daily, off HCTZ 12.5mg  daily, monitor Bp daily.

## 2013-09-07 NOTE — Assessment & Plan Note (Signed)
Tylenol and Tramadol prn available to he

## 2013-09-07 NOTE — Assessment & Plan Note (Addendum)
functional adequately in AL, continue Namenda.

## 2013-09-07 NOTE — Progress Notes (Signed)
Patient ID: Joann Baker, female   DOB: 03/04/1925, 80 y.o.   MRN: [id number]   Code Status: DNR  Allergies  Allergen Reactions  . Alendronate Sodium     Per mar  . Budesonide-Formoterol Fumarate     Per mar  . Fluticasone Propionate (Inhal)     Per mar  . Mometasone Furoate     Per mar  . Omeprazole     Per mar  . Valium [Diazepam]     Chief Complaint  Patient presents with  . Medical Management of Chronic Issues    dementia, blood pressure, gait, cholesterol, anemia    HPI: Patient is a 80 y.o. female [facility] for evaluation of chronic medical conditions.  Problem List Items Addressed This Visit   CAD (coronary artery disease) of artery bypass graft - Primary     No angina since last visit, takes Imdur 30mg , ASA 81mg , Atorvastatin 10mg , Fish oil     Dementia in conditions classified elsewhere without behavioral disturbance     Functional adequately in AL, continue Namenda.    Relevant Medications      Memantine HCl ER (NAMENDA XR) 28 MG CP24   GERD (gastroesophageal reflux disease)     Stable on Zegerid daily.       Hypertension     controlled,  takes Metoprolol 50mg  daily, off HCTZ 12.5mg  daily-no apparent edema, monitor Bp daily.      Insomnia     Sleeps better with Mirtazapine 7.5mg  nightly     Osteoarthritis of left hip     Tylenol and Tramadol prn available to her     Unspecified constipation     Stable on MiraLax daily.         Review of Systems:  Review of Systems  Constitutional: Negative for fever, chills, weight loss, malaise/fatigue and diaphoresis.  HENT: Positive for hearing loss. Negative for congestion, ear discharge, ear pain, nosebleeds and sore throat.   Eyes: Negative for pain, discharge and redness.  Respiratory: Negative for cough, sputum production, shortness of breath and wheezing.   Cardiovascular: Negative for chest pain, palpitations, claudication, leg swelling and PND.    Gastrointestinal: Negative for heartburn, nausea, vomiting, abdominal pain, diarrhea, constipation and blood in stool.  Genitourinary: Positive for frequency. Negative for dysuria, urgency and hematuria.  Musculoskeletal: Positive for back pain. Negative for joint pain, myalgias and neck pain.  Skin: Negative for itching and rash.       The right forehead skin lesion right on her hairline, scaly, non healing.   Neurological: Negative for dizziness, tremors, sensory change, speech change, focal weakness, seizures, loss of consciousness and weakness.  Endo/Heme/Allergies: Negative for environmental allergies and polydipsia. Does not bruise/bleed easily.  Psychiatric/Behavioral: Positive for memory loss. Negative for depression and hallucinations. The patient is not nervous/anxious and does not have insomnia.      Past Medical History  Diagnosis Date  . Hypertension   . DJD (degenerative joint disease)   . Hyperlipidemia   . Coronary atherosclerosis of native coronary artery   . Adjustment disorder with depressed mood   . Dementia in conditions classified elsewhere without behavioral disturbance   . Right bundle branch block   . Asthma   . GERD (gastroesophageal reflux disease)   . Unspecified constipation   . Pain in joint, pelvic region and thigh     left  . Osteoporosis   . Unspecified urinary incontinence   . Anemia   .  Depression   . Edema   . Urinary tract infection, site not specified   . History of fall   . Constipation   . Insomnia    Past Surgical History  Procedure Laterality Date  . Femur im nail  06/19/2011    Procedure: INTRAMEDULLARY (IM) NAIL FEMORAL;  Surgeon: Harvie JuniorJohn L Graves, MD;  Location: [facility] OR;  Service: Orthopedics;  Laterality: Left;  . Abdominal hysterectomy    . Cholecystectomy    . Skin cancer excision  02/15/2013    right forehead Dr. Irene LimboGoodrich   Social History:   reports that she has never smoked. She has never used smokeless tobacco. She reports  that she does not drink alcohol or use illicit drugs.  Medications: Patient's Medications  New Prescriptions   No medications on file  Previous Medications   ACETAMINOPHEN (TYLENOL) 650 MG CR TABLET    Take 650 mg by mouth. Take one every 6 hours as needed for pain   ASPIRIN 81 MG CHEWABLE TABLET    Chew 81 mg by mouth daily.   ATORVASTATIN (LIPITOR) 10 MG TABLET    Take 10 mg by mouth every evening.   CALCIUM-VITAMIN D (OSCAL WITH D) 500-200 MG-UNIT PER TABLET    Take 1 tablet by mouth 3 (three) times daily.   FISH OIL-OMEGA-3 FATTY ACIDS 1000 MG CAPSULE    Take 1 g by mouth daily.   ISOSORBIDE MONONITRATE (IMDUR) 60 MG 24 HR TABLET    Take 30 mg by mouth daily.   MEMANTINE HCL ER (NAMENDA XR) 28 MG CP24    Take by mouth. Take one tablet daily for memory   METOPROLOL (LOPRESSOR) 50 MG TABLET    Take 50 mg by mouth daily.   MIRTAZAPINE (REMERON) 7.5 MG TABLET    Take 7.5 mg by mouth at bedtime.   MULTIPLE VITAMINS-MINERALS (CERTAVITE SENIOR/ANTIOXIDANT PO)    Take 1 tablet by mouth daily.   OMEPRAZOLE-SODIUM BICARBONATE (ZEGERID) 40-1100 MG PER CAPSULE    Take 1 capsule by mouth 2 (two) times daily.   POLYETHYLENE GLYCOL (MIRALAX / GLYCOLAX) PACKET    Take 17 g by mouth daily.   TRAMADOL (ULTRAM) 50 MG TABLET    Take by mouth. One every 4 hours as needed fir oaub  Modified Medications   No medications on file  Discontinued Medications   EXELON 4.6 MG/24HR    APPLY 1 PATCH DAILY.   HYDROCHLOROTHIAZIDE (HYDRODIURIL) 12.5 MG TABLET    Take 12.5 mg by mouth daily.   MEMANTINE (NAMENDA) 10 MG TABLET    Take 10 mg by mouth 2 (two) times daily. For memory     Physical Exam: Physical Exam  Constitutional: She appears well-developed and well-nourished.  HENT:  Head: Normocephalic and atraumatic.  Eyes: EOM are normal. Pupils are equal, round, and reactive to light. Right eye exhibits no discharge. Left eye exhibits no discharge.  Neck: Normal range of motion. Neck supple. No JVD present. No  thyromegaly present.  Cardiovascular: Normal rate and regular rhythm.   No murmur heard. Pulmonary/Chest: No respiratory distress. She has no wheezes. She has no rales.  Abdominal: Soft. Bowel sounds are normal. She exhibits no distension. There is no tenderness.  Musculoskeletal: Normal range of motion. She exhibits no edema and no tenderness.  Ambulates with Jentz-no change  Lymphadenopathy:    She has no cervical adenopathy.  Neurological: She is alert. She has normal reflexes. No cranial nerve deficit. She exhibits normal muscle tone. Coordination normal.  Skin: Skin  is warm and dry. No rash noted. No erythema.  A skin lesion, scaly, non-healing about a quarter size right on her hairline of the right forehead.     Filed Vitals:   09/07/13 1411  BP: 152/80  Pulse: 60  Weight: 152 lb (68.947 kg)      Labs reviewed: Basic Metabolic Panel:  Recent Labs  16/10/96 05/16/13  NA 129*  129* 141  K 3.8  3.8 3.6  BUN 15  15 17   CREATININE 1.2*  1.2* 1.0   CBC: No results found for this basename: WBC, NEUTROABS, HGB, HCT, MCV, PLT,  in the last 8760 hours Past Procedures:  03/25/12 X-ray R+L knees, lumbar spine, pelvis: degenerative spondylosis. No acute fracture, malalignment, lytic destructive lesion, or vertebral body compression deformity. Extensive atherosclerotic vascular calcifications at abdominal aorta and branches.Mild degenerative irregularity at the ischial tuberosity regions with minimal osteoarthritis at the sacroiliac joints. Small degenerative spurs at rhe anterior patella. Slight old healed fracture deformity with orthopedic rods and scres in place at rhe distal shaft and metaphysis of the femur.   05/04/12 CXR minimal cardiomegaly without pulmonary vascular congestion or pleural effusion. Patchy density at the right lower lung likely atelectasis or less pneumonitis.   Assessment/Plan CAD (coronary artery disease) of artery bypass graft No angina since last  visit, takes Imdur 30mg , ASA 81mg , Atorvastatin 10mg , Fish oil   Dementia in conditions classified elsewhere without behavioral disturbance Functional adequately in AL, continue Namenda.  GERD (gastroesophageal reflux disease) Stable on Zegerid daily.     Hypertension controlled,  takes Metoprolol 50mg  daily, off HCTZ 12.5mg  daily-no apparent edema, monitor Bp daily.    Insomnia Sleeps better with Mirtazapine 7.5mg  nightly   Osteoarthritis of left hip Tylenol and Tramadol prn available to her   Unspecified constipation Stable on MiraLax daily.      Family/ Staff Communication: observe the patient  Goals of Care: AL  Labs/tests ordered: none

## 2013-09-07 NOTE — Assessment & Plan Note (Signed)
controlled,  takes Metoprolol 50mg  daily, off HCTZ 12.5mg  daily-no apparent edema, monitor Bp daily.

## 2013-09-07 NOTE — Assessment & Plan Note (Signed)
No angina since last visit, takes Imdur 30mg , ASA 81mg , Atorvastatin 10mg , Fish oil

## 2013-09-07 NOTE — Assessment & Plan Note (Signed)
Stable on Zegerid daily.

## 2013-09-07 NOTE — Assessment & Plan Note (Signed)
Tylenol and Tramadol prn available to her

## 2013-09-07 NOTE — Assessment & Plan Note (Signed)
No angina since last visit, takes Imdur 30mg, ASA 81mg, Atorvastatin 10mg, Fish oil   

## 2013-09-28 ENCOUNTER — Other Ambulatory Visit: Payer: Self-pay | Admitting: Nurse Practitioner

## 2013-10-01 LAB — BASIC METABOLIC PANEL
BUN: 18 mg/dL (ref 4–21)
CREATININE: 1 mg/dL (ref 0.5–1.1)
GLUCOSE: 101 mg/dL
Potassium: 4 mmol/L (ref 3.4–5.3)
Sodium: 137 mmol/L (ref 137–147)

## 2013-10-01 LAB — CBC AND DIFFERENTIAL
HCT: 37 % (ref 36–46)
Hemoglobin: 12.8 g/dL (ref 12.0–16.0)
PLATELETS: 190 10*3/uL (ref 150–399)
WBC: 9.2 10*3/mL

## 2013-10-05 ENCOUNTER — Non-Acute Institutional Stay: Payer: Medicare Other | Admitting: Nurse Practitioner

## 2013-10-05 ENCOUNTER — Encounter: Payer: Self-pay | Admitting: Nurse Practitioner

## 2013-10-05 DIAGNOSIS — F0280 Dementia in other diseases classified elsewhere without behavioral disturbance: Secondary | ICD-10-CM

## 2013-10-05 DIAGNOSIS — G4700 Insomnia, unspecified: Secondary | ICD-10-CM

## 2013-10-05 DIAGNOSIS — K219 Gastro-esophageal reflux disease without esophagitis: Secondary | ICD-10-CM

## 2013-10-05 DIAGNOSIS — K5900 Constipation, unspecified: Secondary | ICD-10-CM

## 2013-10-05 DIAGNOSIS — I10 Essential (primary) hypertension: Secondary | ICD-10-CM

## 2013-10-05 NOTE — Assessment & Plan Note (Signed)
No angina since last visit, takes Imdur 30mg, ASA 81mg, Atorvastatin 10mg, Fish oil   

## 2013-10-05 NOTE — Progress Notes (Signed)
Patient ID: Joann Baker, female   DOB: 06/07/1924, 80 y.o.   MRN: [id number]   Code Status: DNR  Allergies  Allergen Reactions  . Alendronate Sodium     Per mar  . Budesonide-Formoterol Fumarate     Per mar  . Fluticasone Propionate (Inhal)     Per mar  . Mometasone Furoate     Per mar  . Omeprazole     Per mar  . Valium [Diazepam]     Chief Complaint  Patient presents with  . Medical Management of Chronic Issues  . Acute Visit    confusion    HPI: Patient is a 80 y.o. female seen in the AL  at [facility] today for evaluation of increased confusion and chronic medical conditions.  Problem List Items Addressed This Visit   Unspecified constipation     Stable. Takes MiraLax daily.       Insomnia     Sleeps better with Mirtazapine 7.5mg  nightly     Hypertension     ontrolled,  takes Metoprolol 50mg  daily, off HCTZ 12.5mg  daily-no apparent edema, monitor Bp daily.       GERD (gastroesophageal reflux disease)     Stable. Takes Zegerid daily.       Dementia in conditions classified elsewhere without behavioral disturbance - Primary     Staff reported the patient was very confused, CBC, BMP, CXR, Urine culture unremarkable. Takes Namenda for [employer]. Will update MMSE. No focal neurological symptoms noted.        Review of Systems:  Review of Systems  Constitutional: Negative for fever, chills, weight loss, malaise/fatigue and diaphoresis.  HENT: Positive for hearing loss. Negative for congestion, ear discharge, ear pain, nosebleeds and sore throat.   Eyes: Negative for pain, discharge and redness.  Respiratory: Negative for cough, sputum production, shortness of breath and wheezing.   Cardiovascular: Negative for chest pain, palpitations, claudication, leg swelling and PND.  Gastrointestinal: Negative for heartburn, nausea, vomiting, abdominal pain, diarrhea, constipation and blood in stool.  Genitourinary: Positive for frequency. Negative for dysuria,  urgency and hematuria.  Musculoskeletal: Positive for back pain. Negative for joint pain, myalgias and neck pain.  Skin: Negative for itching and rash.       The right forehead skin lesion right on her hairline, scaly, non healing.   Neurological: Negative for dizziness, tremors, sensory change, speech change, focal weakness, seizures, loss of consciousness and weakness.  Endo/Heme/Allergies: Negative for environmental allergies and polydipsia. Does not bruise/bleed easily.  Psychiatric/Behavioral: Positive for memory loss. Negative for depression and hallucinations. The patient is not nervous/anxious and does not have insomnia.      Past Medical History  Diagnosis Date  . Hypertension   . DJD (degenerative joint disease)   . Hyperlipidemia   . Coronary atherosclerosis of native coronary artery   . Adjustment disorder with depressed mood   . Dementia in conditions classified elsewhere without behavioral disturbance   . Right bundle branch block   . Asthma   . GERD (gastroesophageal reflux disease)   . Unspecified constipation   . Pain in joint, pelvic region and thigh     left  . Osteoporosis   . Unspecified urinary incontinence   . Anemia   . Depression   . Edema   . Urinary tract infection, site not specified   . History of fall   . Constipation   . Insomnia    Past Surgical History  Procedure Laterality Date  . Femur im  nail  06/19/2011    Procedure: INTRAMEDULLARY (IM) NAIL FEMORAL;  Surgeon: Harvie Junior, MD;  Location: [facility] OR;  Service: Orthopedics;  Laterality: Left;  . Abdominal hysterectomy    . Cholecystectomy    . Skin cancer excision  02/15/2013    right forehead Dr. Irene Limbo   Social History:   reports that she has never smoked. She has never used smokeless tobacco. She reports that she does not drink alcohol or use illicit drugs.  Medications: Patient's Medications  New Prescriptions   No medications on file  Previous Medications   ACETAMINOPHEN  (TYLENOL) 650 MG CR TABLET    Take 650 mg by mouth. Take one every 6 hours as needed for pain   ASPIRIN 81 MG CHEWABLE TABLET    Chew 81 mg by mouth daily.   ATORVASTATIN (LIPITOR) 10 MG TABLET    Take 10 mg by mouth every evening.   CALCIUM-VITAMIN D (OSCAL WITH D) 500-200 MG-UNIT PER TABLET    Take 1 tablet by mouth 3 (three) times daily.   FISH OIL-OMEGA-3 FATTY ACIDS 1000 MG CAPSULE    Take 1 g by mouth daily.   ISOSORBIDE MONONITRATE (IMDUR) 60 MG 24 HR TABLET    Take 30 mg by mouth daily.   MEMANTINE HCL ER (NAMENDA XR) 28 MG CP24    Take by mouth. Take one tablet daily for memory   METOPROLOL (LOPRESSOR) 50 MG TABLET    Take 50 mg by mouth daily.   MIRTAZAPINE (REMERON) 7.5 MG TABLET    Take 7.5 mg by mouth at bedtime.   MULTIPLE VITAMINS-MINERALS (CERTAVITE SENIOR/ANTIOXIDANT PO)    Take 1 tablet by mouth daily.   OMEPRAZOLE-SODIUM BICARBONATE (ZEGERID) 40-1100 MG PER CAPSULE    Take 1 capsule by mouth 2 (two) times daily.   POLYETHYLENE GLYCOL (MIRALAX / GLYCOLAX) PACKET    Take 17 g by mouth daily.  Modified Medications   No medications on file  Discontinued Medications   No medications on file     Physical Exam: Physical Exam  Constitutional: She appears well-developed and well-nourished.  HENT:  Head: Normocephalic and atraumatic.  Eyes: EOM are normal. Pupils are equal, round, and reactive to light. Right eye exhibits no discharge. Left eye exhibits no discharge.  Neck: Normal range of motion. Neck supple. No JVD present. No thyromegaly present.  Cardiovascular: Normal rate and regular rhythm.   No murmur heard. Pulmonary/Chest: No respiratory distress. She has no wheezes. She has no rales.  Abdominal: Soft. Bowel sounds are normal. She exhibits no distension. There is no tenderness.  Musculoskeletal: Normal range of motion. She exhibits no edema and no tenderness.  Ambulates with [place]-no change  Lymphadenopathy:    She has no cervical adenopathy.  Neurological: She  is alert. She has normal reflexes. No cranial nerve deficit. She exhibits normal muscle tone. Coordination normal.  Skin: Skin is warm and dry. No rash noted. No erythema.  A skin lesion, scaly, non-healing about a quarter size right on her hairline of the right forehead.     Filed Vitals:   10/05/13 1239  BP: 154/82  Pulse: 66  Temp: 97.8 F (36.6 C)  TempSrc: Tympanic  Resp: 18      Labs reviewed: Basic Metabolic Panel:  Recent Labs  98/92/11 05/16/13 10/01/13  NA 129*  129* 141 137  K 3.8  3.8 3.6 4.0  BUN 15  15 17 18   CREATININE 1.2*  1.2* 1.0 1.0   CBC:  Recent Labs  10/01/13  WBC 9.2  HGB 12.8  HCT 37  PLT 190   Past Procedures:  03/25/12 X-ray R+L knees, lumbar spine, pelvis: degenerative spondylosis. No acute fracture, malalignment, lytic destructive lesion, or vertebral body compression deformity. Extensive atherosclerotic vascular calcifications at abdominal aorta and branches.Mild degenerative irregularity at the ischial tuberosity regions with minimal osteoarthritis at the sacroiliac joints. Small degenerative spurs at rhe anterior patella. Slight old healed fracture deformity with orthopedic rods and scres in place at rhe distal shaft and metaphysis of the femur.   05/04/12 CXR minimal cardiomegaly without pulmonary vascular congestion or pleural effusion. Patchy density at the right lower lung likely atelectasis or less pneumonitis.   10/02/13 no acute cardiopulmonary abnormality  Assessment/Plan Dementia in conditions classified elsewhere without behavioral disturbance Staff reported the patient was very confused, CBC, BMP, CXR, Urine culture unremarkable. Takes Namenda for [employer]. Will update MMSE. No focal neurological symptoms noted.   Anemia Resolved. Hgb 12.8 10/01/13  CAD (coronary artery disease) of artery bypass graft No angina since last visit, takes Imdur 30mg , ASA 81mg , Atorvastatin 10mg , Fish oil    Hypertension ontrolled,  takes  Metoprolol 50mg  daily, off HCTZ 12.5mg  daily-no apparent edema, monitor Bp daily.     Unspecified constipation Stable. Takes MiraLax daily.     GERD (gastroesophageal reflux disease) Stable. Takes Zegerid daily.     Insomnia Sleeps better with Mirtazapine 7.5mg  nightly     Family/ Staff Communication: observe the patient  Goals of Care: AL  Labs/tests ordered: CBC, BMP, CXR, UA done 10/01/13

## 2013-10-05 NOTE — Assessment & Plan Note (Signed)
Resolved. Hgb 12.8 10/01/13

## 2013-10-05 NOTE — Assessment & Plan Note (Signed)
Staff reported the patient was very confused, CBC, BMP, CXR, Urine culture unremarkable. Takes Namenda for [employer]. Will update MMSE. No focal neurological symptoms noted.

## 2013-10-05 NOTE — Assessment & Plan Note (Signed)
Stable. Takes Zegerid daily.

## 2013-10-05 NOTE — Assessment & Plan Note (Signed)
Sleeps better with Mirtazapine 7.5mg  nightly

## 2013-10-05 NOTE — Assessment & Plan Note (Signed)
Stable. Takes MiraLax daily.

## 2013-10-05 NOTE — Assessment & Plan Note (Signed)
ontrolled,  takes Metoprolol 50mg  daily, off HCTZ 12.5mg  daily-no apparent edema, monitor Bp daily.

## 2013-10-12 ENCOUNTER — Encounter: Payer: Self-pay | Admitting: Nurse Practitioner

## 2013-12-14 ENCOUNTER — Non-Acute Institutional Stay: Payer: Medicare Other | Admitting: Nurse Practitioner

## 2013-12-14 ENCOUNTER — Encounter: Payer: Self-pay | Admitting: Nurse Practitioner

## 2013-12-14 VITALS — BP 184/80 | HR 68 | Wt 154.0 lb

## 2013-12-14 DIAGNOSIS — K5900 Constipation, unspecified: Secondary | ICD-10-CM

## 2013-12-14 DIAGNOSIS — G5693 Unspecified mononeuropathy of bilateral upper limbs: Secondary | ICD-10-CM

## 2013-12-14 DIAGNOSIS — I10 Essential (primary) hypertension: Secondary | ICD-10-CM

## 2013-12-14 DIAGNOSIS — G609 Hereditary and idiopathic neuropathy, unspecified: Secondary | ICD-10-CM

## 2013-12-14 DIAGNOSIS — K219 Gastro-esophageal reflux disease without esophagitis: Secondary | ICD-10-CM

## 2013-12-14 DIAGNOSIS — F0280 Dementia in other diseases classified elsewhere without behavioral disturbance: Secondary | ICD-10-CM

## 2013-12-14 DIAGNOSIS — L219 Seborrheic dermatitis, unspecified: Secondary | ICD-10-CM

## 2013-12-14 DIAGNOSIS — R609 Edema, unspecified: Secondary | ICD-10-CM

## 2013-12-14 NOTE — Assessment & Plan Note (Signed)
No angina since last visit, takes Imdur 30mg, ASA 81mg, Atorvastatin 10mg, Fish oil   

## 2013-12-14 NOTE — Assessment & Plan Note (Signed)
Staff reported the patient was very confused, CBC, BMP, CXR, Urine culture unremarkable.  MMSE 10/30 09/2013. Takes Namenda for [employer]. Will update MMSE. No focal neurological symptoms noted.

## 2013-12-14 NOTE — Progress Notes (Signed)
Patient ID: Joann Baker, female   DOB: Sep 07, 1924, 80 y.o.   MRN: [id number]   Code Status: DNR  Allergies  Allergen Reactions  . Alendronate Sodium     Per mar  . Budesonide-Formoterol Fumarate     Per mar  . Fluticasone Propionate (Inhal)     Per mar  . Mometasone Furoate     Per mar  . Omeprazole     Per mar  . Valium [Diazepam]     Chief Complaint  Patient presents with  . Medical Management of Chronic Issues    blood pressure, dementia.   c/o yellow scapes in hair. Here with daughter Rosey Bath    HPI: Patient is a 80 y.o. female seen in the AL  at [facility] today for evaluation of itching/scaly/yellow scabs scalp, tingling/buring feet,  and chronic medical conditions.  Problem List Items Addressed This Visit   Cradle cap - Primary     Crusting and yellow scales on scalp-shampoo hair with Selsun Blue or Tar shampoo 3-4x/wk instead 2 x/day.     Dementia in conditions classified elsewhere without behavioral disturbance     Staff reported the patient was very confused, CBC, BMP, CXR, Urine culture unremarkable.  MMSE 10/30 09/2013. Takes Namenda for [employer]. Will update MMSE. No focal neurological symptoms noted.      Edema     Trace in ankles. No SOB, phlegm production, or orthopnea. Continue to observe.     Essential hypertension, benign (Chronic)     Controlled,  takes Metoprolol  daily, off HCTZ 12.5mg  daily-trace edema in ankles(hx of HCTZ for edema), monitor Bp daily.       GERD (gastroesophageal reflux disease)     Stable. Takes Zegerid daily.        Neuropathy of both upper extremities     Clinically presumed. C/o on and off tingling and burning sensation in BLE. Observe for now.     Unspecified constipation     Stable. Takes MiraLax daily.          Review of Systems:  Review of Systems  Constitutional: Negative for fever, chills, weight loss, malaise/fatigue and diaphoresis.  HENT: Positive for hearing loss. Negative for congestion,  ear discharge, ear pain, nosebleeds and sore throat.   Eyes: Negative for pain, discharge and redness.  Respiratory: Negative for cough, sputum production, shortness of breath and wheezing.   Cardiovascular: Positive for leg swelling. Negative for chest pain, palpitations, claudication and PND.       Trace edema in R+L ankles.   Gastrointestinal: Negative for heartburn, nausea, vomiting, abdominal pain, diarrhea, constipation and blood in stool.  Genitourinary: Negative for dysuria, urgency, frequency and hematuria.       Sleeps through night.   Musculoskeletal: Positive for back pain. Negative for joint pain, myalgias and neck pain.  Skin: Negative for itching and rash.       The right forehead skin lesion right on her hairline-removed and healed. Yellow scabs, scaly, and itching scalp.  Neurological: Positive for tingling. Negative for dizziness, tremors, sensory change, speech change, focal weakness, seizures, loss of consciousness and weakness.       Burning sensation R+L feet.   Endo/Heme/Allergies: Negative for environmental allergies and polydipsia. Does not bruise/bleed easily.  Psychiatric/Behavioral: Positive for memory loss. Negative for depression and hallucinations. The patient is not nervous/anxious and does not have insomnia.      Past Medical History  Diagnosis Date  . Hypertension   . DJD (degenerative joint disease)   .  Hyperlipidemia   . Coronary atherosclerosis of native coronary artery   . Adjustment disorder with depressed mood   . Dementia in conditions classified elsewhere without behavioral disturbance   . Right bundle branch block   . Asthma   . GERD (gastroesophageal reflux disease)   . Unspecified constipation   . Pain in joint, pelvic region and thigh     left  . Osteoporosis   . Unspecified urinary incontinence   . Anemia   . Depression   . Edema   . Urinary tract infection, site not specified   . History of fall   . Constipation   . Insomnia     Past Surgical History  Procedure Laterality Date  . Femur im nail  06/19/2011    Procedure: INTRAMEDULLARY (IM) NAIL FEMORAL;  Surgeon: Harvie Junior, MD;  Location: [facility] OR;  Service: Orthopedics;  Laterality: Left;  . Abdominal hysterectomy    . Cholecystectomy    . Skin cancer excision  02/15/2013    right forehead Dr. Irene Limbo   Social History:   reports that she has never smoked. She has never used smokeless tobacco. She reports that she does not drink alcohol or use illicit drugs.  Medications: Patient's Medications  New Prescriptions   No medications on file  Previous Medications   ACETAMINOPHEN (TYLENOL) 650 MG CR TABLET    Take 650 mg by mouth. Take one every 6 hours as needed for pain   ASPIRIN 81 MG CHEWABLE TABLET    Chew 81 mg by mouth daily.   ATORVASTATIN (LIPITOR) 10 MG TABLET    Take 10 mg by mouth every evening.   CALCIUM-VITAMIN D (OSCAL WITH D) 500-200 MG-UNIT PER TABLET    Take 1 tablet by mouth 3 (three) times daily.   FISH OIL-OMEGA-3 FATTY ACIDS 1000 MG CAPSULE    Take 1 g by mouth daily.   ISOSORBIDE MONONITRATE (IMDUR) 60 MG 24 HR TABLET    Take 30 mg by mouth daily.   MEMANTINE HCL ER (NAMENDA XR) 28 MG CP24    Take by mouth. Take one tablet daily for memory   METOPROLOL (LOPRESSOR) 50 MG TABLET    Take 50 mg by mouth daily.   MIRTAZAPINE (REMERON) 7.5 MG TABLET    Take 7.5 mg by mouth at bedtime.   MULTIPLE VITAMINS-MINERALS (CERTAVITE SENIOR/ANTIOXIDANT PO)    Take 1 tablet by mouth daily.   OMEPRAZOLE-SODIUM BICARBONATE (ZEGERID) 40-1100 MG PER CAPSULE    Take 1 capsule by mouth 2 (two) times daily.   POLYETHYLENE GLYCOL (MIRALAX / GLYCOLAX) PACKET    Take 17 g by mouth daily.  Modified Medications   No medications on file  Discontinued Medications   No medications on file     Physical Exam: Physical Exam  Constitutional: She appears well-developed and well-nourished.  HENT:  Head: Normocephalic and atraumatic.  Eyes: EOM are normal. Pupils are  equal, round, and reactive to light. Right eye exhibits no discharge. Left eye exhibits no discharge.  Neck: Normal range of motion. Neck supple. No JVD present. No thyromegaly present.  Cardiovascular: Normal rate and regular rhythm.   No murmur heard. Pulmonary/Chest: No respiratory distress. She has no wheezes. She has no rales.  Abdominal: Soft. Bowel sounds are normal. She exhibits no distension. There is no tenderness.  Musculoskeletal: Normal range of motion. She exhibits edema. She exhibits no tenderness.  Ambulates with [place]-no change. Trace edema ankles  Lymphadenopathy:    She has no cervical adenopathy.  Neurological: She is alert. She has normal reflexes. No cranial nerve deficit. She exhibits normal muscle tone. Coordination normal.  Skin: Skin is warm and dry. No rash noted. No erythema.  A skin lesion, scaly, non-healing about a quarter size right on her hairline of the right forehead-removed and healed. Yellow scabs, itching, and scaly scalp.     Filed Vitals:   12/14/13 1434  BP: 184/80  Pulse: 68  Weight: 154 lb (69.854 kg)      Labs reviewed: Basic Metabolic Panel:  Recent Labs  16/10/96 05/16/13 10/01/13  NA 129*  129* 141 137  K 3.8  3.8 3.6 4.0  BUN CREATININE 1.2*  1.2* 1.0 1.0   CBC:  Recent Labs  10/01/13  WBC 9.2  HGB 12.8  HCT 37  PLT 190   Past Procedures:  03/25/12 X-ray R+L knees, lumbar spine, pelvis: degenerative spondylosis. No acute fracture, malalignment, lytic destructive lesion, or vertebral body compression deformity. Extensive atherosclerotic vascular calcifications at abdominal aorta and branches.Mild degenerative irregularity at the ischial tuberosity regions with minimal osteoarthritis at the sacroiliac joints. Small degenerative spurs at rhe anterior patella. Slight old healed fracture deformity with orthopedic rods and scres in place at rhe distal shaft and metaphysis of the femur.   05/04/12 CXR minimal  cardiomegaly without pulmonary vascular congestion or pleural effusion. Patchy density at the right lower lung likely atelectasis or less pneumonitis.   10/02/13 no acute cardiopulmonary abnormality  Assessment/Plan Cradle cap Crusting and yellow scales on scalp-shampoo hair with Selsun Blue or Tar shampoo 3-4x/wk instead 2 x/day.   Edema Trace in ankles. No SOB, phlegm production, or orthopnea. Continue to observe.   Neuropathy of both upper extremities Clinically presumed. C/o on and off tingling and burning sensation in BLE. Observe for now.   CAD (coronary artery disease) of artery bypass graft No angina since last visit, takes Imdur , ASA , Atorvastatin , Fish oil    Essential hypertension, benign Controlled,  takes Metoprolol  daily, off HCTZ 12.5mg  daily-trace edema in ankles(hx of HCTZ for edema), monitor Bp daily.     Dementia in conditions classified elsewhere without behavioral disturbance Staff reported the patient was very confused, CBC, BMP, CXR, Urine culture unremarkable.  MMSE 10/30 09/2013. Takes Namenda for [employer]. Will update MMSE. No focal neurological symptoms noted.    Unspecified constipation Stable. Takes MiraLax daily.     GERD (gastroesophageal reflux disease) Stable. Takes Zegerid daily.        Family/ Staff Communication: observe the patient  Goals of Care: AL  Labs/tests ordered: none

## 2013-12-14 NOTE — Assessment & Plan Note (Signed)
Stable. Takes MiraLax daily.    

## 2013-12-14 NOTE — Assessment & Plan Note (Signed)
Trace in ankles. No SOB, phlegm production, or orthopnea. Continue to observe.     

## 2013-12-14 NOTE — Assessment & Plan Note (Signed)
Crusting and yellow scales on scalp-shampoo hair with Selsun Blue or Tar shampoo 3-4x/wk instead 2 x/day.

## 2013-12-14 NOTE — Assessment & Plan Note (Signed)
Stable. Takes Zegerid daily.    

## 2013-12-14 NOTE — Assessment & Plan Note (Signed)
Controlled,  takes Metoprolol 50mg daily, off HCTZ 12.5mg daily-trace edema in ankles(hx of HCTZ for edema), monitor Bp daily.    

## 2013-12-14 NOTE — Assessment & Plan Note (Signed)
Clinically presumed. C/o on and off tingling and burning sensation in BLE. Observe for now.

## 2013-12-21 LAB — BASIC METABOLIC PANEL
BUN: 12 mg/dL (ref 4–21)
CREATININE: 0.8 mg/dL (ref 0.5–1.1)
GLUCOSE: 96 mg/dL
POTASSIUM: 4.1 mmol/L (ref 3.4–5.3)
SODIUM: 142 mmol/L (ref 137–147)

## 2013-12-21 LAB — CBC AND DIFFERENTIAL
HCT: 37 % (ref 36–46)
HEMOGLOBIN: 12.1 g/dL (ref 12.0–16.0)
Platelets: 191 10*3/uL (ref 150–399)
WBC: 7.8 10*3/mL

## 2013-12-21 LAB — TSH: TSH: 1.62 u[IU]/mL (ref 0.41–5.90)

## 2013-12-28 ENCOUNTER — Other Ambulatory Visit: Payer: Self-pay | Admitting: Nurse Practitioner

## 2013-12-28 DIAGNOSIS — E871 Hypo-osmolality and hyponatremia: Secondary | ICD-10-CM

## 2014-04-05 ENCOUNTER — Encounter: Payer: Self-pay | Admitting: Nurse Practitioner

## 2014-04-05 ENCOUNTER — Non-Acute Institutional Stay: Payer: Medicare Other | Admitting: Nurse Practitioner

## 2014-04-05 DIAGNOSIS — G4700 Insomnia, unspecified: Secondary | ICD-10-CM

## 2014-04-05 DIAGNOSIS — F0280 Dementia in other diseases classified elsewhere without behavioral disturbance: Secondary | ICD-10-CM

## 2014-04-05 DIAGNOSIS — K219 Gastro-esophageal reflux disease without esophagitis: Secondary | ICD-10-CM

## 2014-04-05 DIAGNOSIS — I10 Essential (primary) hypertension: Secondary | ICD-10-CM

## 2014-04-05 DIAGNOSIS — I25700 Atherosclerosis of coronary artery bypass graft(s), unspecified, with unstable angina pectoris: Secondary | ICD-10-CM

## 2014-04-05 DIAGNOSIS — K5900 Constipation, unspecified: Secondary | ICD-10-CM

## 2014-04-05 NOTE — Progress Notes (Signed)
Patient ID: Joann Baker, female   DOB: 01/17/1925, 80 y.o.   MRN: [id number]   Code Status: DNR  Allergies  Allergen Reactions  . Alendronate Sodium     Per mar  . Budesonide-Formoterol Fumarate     Per mar  . Fluticasone Propionate (Inhal)     Per mar  . Mometasone Furoate     Per mar  . Omeprazole     Per mar  . Valium [Diazepam]     Chief Complaint  Patient presents with  . Medical Management of Chronic Issues  . Acute Visit    confusion.     HPI: Patient is a 80 y.o. female [facility] AL  [facility] today for evaluation of increased confusion  and chronic medical conditions.  Problem List Items Addressed This Visit    Insomnia - Primary    04/05/14 Pharm: tapering off Mirtazapine.      Hypertension    Controlled,  takes Metoprolol 50mg  daily, off HCTZ 12.5mg  daily-trace edema in ankles(hx of HCTZ for edema), monitor Bp daily.        GERD (gastroesophageal reflux disease)    Stable. Takes Zegerid daily.       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 09/2013 MMSE 10/30. No focal neurological deficits noted.        Constipation    Stable. Takes MiraLax daily.       CAD (coronary artery disease) of artery bypass graft    No angina since last visit, takes Imdur 30mg , ASA 81mg , Atorvastatin 10mg , Fish oil         Review of Systems:  Review of Systems  Constitutional: Negative for fever, chills, weight loss, malaise/fatigue and diaphoresis.  HENT: Positive for hearing loss. Negative for congestion, ear discharge, ear pain, nosebleeds and sore throat.   Eyes: Negative for pain, discharge and redness.  Respiratory: Negative for cough, sputum production, shortness of breath and wheezing.   Cardiovascular: Positive for leg swelling. Negative for chest pain, palpitations, claudication and PND.       Trace edema in R+L ankles.   Gastrointestinal:  Negative for heartburn, nausea, vomiting, abdominal pain, diarrhea, constipation and blood in stool.  Genitourinary: Negative for dysuria, urgency, frequency and hematuria.       Sleeps through night.   Musculoskeletal: Positive for back pain. Negative for myalgias, joint pain and neck pain.  Skin: Negative for itching and rash.       The right forehead skin lesion right on her hairline-removed and healed.  Neurological: Positive for tingling. Negative for dizziness, tremors, sensory change, speech change, focal weakness, seizures, loss of consciousness and weakness.       Burning sensation R+L feet.   Endo/Heme/Allergies: Negative for environmental allergies and polydipsia. Does not bruise/bleed easily.  Psychiatric/Behavioral: Positive for memory loss. Negative for depression and hallucinations. The patient is not nervous/anxious and does not have insomnia.      Past Medical History  Diagnosis Date  . Hypertension   . DJD (degenerative joint disease)   . Hyperlipidemia   . Coronary atherosclerosis of native coronary artery   . Adjustment disorder with depressed mood   . Dementia in conditions classified elsewhere without behavioral disturbance   . Right bundle branch block   . Asthma   . GERD (gastroesophageal reflux disease)   . Unspecified constipation   . Pain in joint, pelvic  region and thigh     left  . Osteoporosis   . Unspecified urinary incontinence   . Anemia   . Depression   . Edema   . Urinary tract infection, site not specified   . History of fall   . Constipation   . Insomnia    Past Surgical History  Procedure Laterality Date  . Femur im nail  06/19/2011    Procedure: INTRAMEDULLARY (IM) NAIL FEMORAL;  Surgeon: Harvie JuniorJohn L Graves, MD;  Location: [facility] OR;  Service: Orthopedics;  Laterality: Left;  . Abdominal hysterectomy    . Cholecystectomy    . Skin cancer excision  02/15/2013    right forehead Dr. Irene LimboGoodrich   Social History:   reports that she has never smoked.  She has never used smokeless tobacco. She reports that she does not drink alcohol or use illicit drugs.  Medications: Patient's Medications  New Prescriptions   No medications on file  Previous Medications   ACETAMINOPHEN (TYLENOL) 650 MG CR TABLET    Take 650 mg by mouth. Take one every 6 hours as needed for pain   ASPIRIN 81 MG CHEWABLE TABLET    Chew 81 mg by mouth daily.   ATORVASTATIN (LIPITOR) 10 MG TABLET    Take 10 mg by mouth every evening.   CALCIUM-VITAMIN D (OSCAL WITH D) 500-200 MG-UNIT PER TABLET    Take 1 tablet by mouth 3 (three) times daily.   FISH OIL-OMEGA-3 FATTY ACIDS 1000 MG CAPSULE    Take 1 g by mouth daily.   ISOSORBIDE MONONITRATE (IMDUR) 60 MG 24 HR TABLET    Take 30 mg by mouth daily.   MEMANTINE HCL ER (NAMENDA XR) 28 MG CP24    Take by mouth. Take one tablet daily for memory   METOPROLOL (LOPRESSOR) 50 MG TABLET    Take 50 mg by mouth daily.   MIRTAZAPINE (REMERON) 7.5 MG TABLET    Take 7.5 mg by mouth at bedtime.   MULTIPLE VITAMINS-MINERALS (CERTAVITE SENIOR/ANTIOXIDANT PO)    Take 1 tablet by mouth daily.   OMEPRAZOLE-SODIUM BICARBONATE (ZEGERID) 40-1100 MG PER CAPSULE    Take 1 capsule by mouth 2 (two) times daily.   POLYETHYLENE GLYCOL (MIRALAX / GLYCOLAX) PACKET    Take 17 g by mouth daily.  Modified Medications   No medications on file  Discontinued Medications   No medications on file     Physical Exam: Physical Exam  Constitutional: She appears well-developed and well-nourished.  HENT:  Head: Normocephalic and atraumatic.  Eyes: EOM are normal. Pupils are equal, round, and reactive to light. Right eye exhibits no discharge. Left eye exhibits no discharge.  Neck: Normal range of motion. Neck supple. No JVD present. No thyromegaly present.  Cardiovascular: Normal rate and regular rhythm.   No murmur heard. Pulmonary/Chest: No respiratory distress. She has no wheezes. She has no rales.  Abdominal: Soft. Bowel sounds are normal. She exhibits no  distension. There is no tenderness.  Musculoskeletal: Normal range of motion. She exhibits edema. She exhibits no tenderness.  Ambulates with Tyrell-no change. Trace edema ankles  Lymphadenopathy:    She has no cervical adenopathy.  Neurological: She is alert. She has normal reflexes. No cranial nerve deficit. She exhibits normal muscle tone. Coordination normal.  Skin: Skin is warm and dry. No rash noted. No erythema.    Filed Vitals:   04/05/14 1127  BP: 130/80  Pulse: 64  Temp: 98.1 F (36.7 C)  TempSrc: Tympanic  Resp: 20  Labs reviewed: Basic Metabolic Panel:  Recent Labs  16/10/96 10/01/13 12/21/13  NA 141 137 142  K 3.6 4.0 4.1  BUN 17 18 12   CREATININE 1.0 1.0 0.8  TSH  --   --  1.62   CBC:  Recent Labs  10/01/13 12/21/13  WBC 9.2 7.8  HGB 12.8 12.1  HCT 37 37  PLT 190 191   Past Procedures:  03/25/12 X-ray R+L knees, lumbar spine, pelvis: degenerative spondylosis. No acute fracture, malalignment, lytic destructive lesion, or vertebral body compression deformity. Extensive atherosclerotic vascular calcifications at abdominal aorta and branches.Mild degenerative irregularity at the ischial tuberosity regions with minimal osteoarthritis at the sacroiliac joints. Small degenerative spurs at rhe anterior patella. Slight old healed fracture deformity with orthopedic rods and scres in place at rhe distal shaft and metaphysis of the femur.   05/04/12 CXR minimal cardiomegaly without pulmonary vascular congestion or pleural effusion. Patchy density at the right lower lung likely atelectasis or less pneumonitis.   10/02/13 no acute cardiopulmonary abnormality  Assessment/Plan Insomnia 04/05/14 Pharm: tapering off Mirtazapine.    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 09/2013 MMSE 10/30. No focal neurological deficits noted.      CAD (coronary artery disease) of  artery bypass graft No angina since last visit, takes Imdur 30mg , ASA 81mg , Atorvastatin 10mg , Fish oil    Hypertension Controlled,  takes Metoprolol 50mg  daily, off HCTZ 12.5mg  daily-trace edema in ankles(hx of HCTZ for edema), monitor Bp daily.      Constipation Stable. Takes MiraLax daily.     GERD (gastroesophageal reflux disease) Stable. Takes Zegerid daily.       Family/ Staff Communication: observe the patient  Goals of Care: AL  Labs/tests ordered: CBC, CMP, UA C/S

## 2014-04-05 NOTE — Assessment & Plan Note (Signed)
Stable. Takes Zegerid daily.    

## 2014-04-05 NOTE — Assessment & Plan Note (Signed)
04/05/14 Pharm: tapering off Mirtazapine.

## 2014-04-05 NOTE — Assessment & Plan Note (Signed)
Staff reported the patient was very confused, CBC, CMP, UA C/S in am. 09/2013 MMSE 10/30. No focal neurological deficits noted.

## 2014-04-05 NOTE — Assessment & Plan Note (Signed)
Controlled,  takes Metoprolol 50mg  daily, off HCTZ 12.5mg  daily-trace edema in ankles(hx of HCTZ for edema), monitor Bp daily.

## 2014-04-05 NOTE — Assessment & Plan Note (Signed)
Stable. Takes MiraLax daily.    

## 2014-04-05 NOTE — Assessment & Plan Note (Signed)
No angina since last visit, takes Imdur 30mg , ASA 81mg , Atorvastatin 10mg , Fish oil

## 2014-04-07 LAB — CBC AND DIFFERENTIAL
HCT: 38 % (ref 36–46)
Hemoglobin: 12.6 g/dL (ref 12.0–16.0)
Platelets: 208 10*3/uL (ref 150–399)
WBC: 9.2 10^3/mL

## 2014-04-07 LAB — HEPATIC FUNCTION PANEL
ALK PHOS: 92 U/L (ref 25–125)
ALT: 16 U/L (ref 7–35)
AST: 20 U/L (ref 13–35)
Bilirubin, Total: 0.5 mg/dL

## 2014-04-07 LAB — BASIC METABOLIC PANEL
BUN: 17 mg/dL (ref 4–21)
CREATININE: 0.5 mg/dL (ref 0.5–1.1)
GLUCOSE: 116 mg/dL
POTASSIUM: 4.3 mmol/L (ref 3.4–5.3)
SODIUM: 141 mmol/L (ref 137–147)

## 2014-04-11 ENCOUNTER — Other Ambulatory Visit: Payer: Self-pay | Admitting: Nurse Practitioner

## 2014-05-23 ENCOUNTER — Encounter: Payer: Self-pay | Admitting: Nurse Practitioner

## 2014-05-23 ENCOUNTER — Non-Acute Institutional Stay (SKILLED_NURSING_FACILITY): Payer: Medicare Other | Admitting: Nurse Practitioner

## 2014-05-23 DIAGNOSIS — I10 Essential (primary) hypertension: Secondary | ICD-10-CM

## 2014-05-23 DIAGNOSIS — R609 Edema, unspecified: Secondary | ICD-10-CM

## 2014-05-23 DIAGNOSIS — I25700 Atherosclerosis of coronary artery bypass graft(s), unspecified, with unstable angina pectoris: Secondary | ICD-10-CM

## 2014-05-23 DIAGNOSIS — K219 Gastro-esophageal reflux disease without esophagitis: Secondary | ICD-10-CM

## 2014-05-23 DIAGNOSIS — K5900 Constipation, unspecified: Secondary | ICD-10-CM

## 2014-05-23 DIAGNOSIS — F0390 Unspecified dementia without behavioral disturbance: Secondary | ICD-10-CM

## 2014-05-23 DIAGNOSIS — E785 Hyperlipidemia, unspecified: Secondary | ICD-10-CM

## 2014-05-23 NOTE — Assessment & Plan Note (Signed)
Takes Atorvastatin.  

## 2014-05-23 NOTE — Assessment & Plan Note (Signed)
No angina since last visit, takes Imdur 30mg, ASA 81mg, Atorvastatin 10mg, Fish oil   

## 2014-05-23 NOTE — Assessment & Plan Note (Signed)
Stable. Takes Zegerid daily.    

## 2014-05-23 NOTE — Assessment & Plan Note (Signed)
Takes Namenda for [employer]. Progressing gradually. Needs SNF-Memory Care Unit for care needs.

## 2014-05-23 NOTE — Assessment & Plan Note (Signed)
Stable. Takes MiraLax daily.    

## 2014-05-23 NOTE — Assessment & Plan Note (Signed)
Controlled,  takes Metoprolol 50mg daily, off HCTZ 12.5mg daily-trace edema in ankles(hx of HCTZ for edema), monitor Bp daily.    

## 2014-05-23 NOTE — Assessment & Plan Note (Signed)
Trace in ankles. No SOB, phlegm production, or orthopnea. Continue to observe.     

## 2014-05-23 NOTE — Progress Notes (Signed)
Patient ID: Joann BallerBetty P Baker, female   DOB: 07/18/1924, 81 y.o.   MRN: [id number]   Code Status: DNR  Allergies  Allergen Reactions  . Alendronate Sodium     Per mar  . Budesonide-Formoterol Fumarate     Per mar  . Fluticasone Propionate (Inhal)     Per mar  . Mometasone Furoate     Per mar  . Omeprazole     Per mar  . Valium [Diazepam]     Chief Complaint  Patient presents with  . Medical Management of Chronic Issues    HPI: Patient is a 81 y.o. female [facility] today for evaluation of chronic medical conditions.   The patient as admitted to Memory Care Unit due to her progression of dementia and lack of self care sufficiency in AL  Problem List Items Addressed This Visit    Hypertension - Primary    Controlled,  takes Metoprolol 50mg  daily, off HCTZ 12.5mg  daily-trace edema in ankles(hx of HCTZ for edema), monitor Bp daily.         GERD (gastroesophageal reflux disease)    Stable. Takes Zegerid daily.        Edema    Trace in ankles. No SOB, phlegm production, or orthopnea. Continue to observe.        Dyslipidemia    Takes Atorvastatin.       Dementia (Chronic)    Takes Namenda for memory. Progressing gradually. Needs SNF-Memory Care Unit for care needs.       Constipation    Stable. Takes MiraLax daily.         CAD (coronary artery disease) of artery bypass graft    No angina since last visit, takes Imdur 30mg , ASA 81mg , Atorvastatin 10mg , Fish oil          Review of Systems:  Review of Systems  Constitutional: Negative for fever, chills, weight loss, malaise/fatigue and diaphoresis.  HENT: Positive for hearing loss. Negative for congestion, ear discharge, ear pain, nosebleeds, sore throat and tinnitus.   Eyes: Negative for blurred vision, double vision, photophobia, pain, discharge and redness.  Respiratory: Negative for cough, hemoptysis, sputum production, shortness of breath, wheezing and stridor.     Cardiovascular: Positive for leg swelling. Negative for chest pain, palpitations, orthopnea, claudication and PND.       Trace  Gastrointestinal: Negative for heartburn, nausea, vomiting, abdominal pain, diarrhea, constipation, blood in stool and melena.  Genitourinary: Negative for dysuria, urgency, frequency, hematuria and flank pain.  Musculoskeletal: Negative for myalgias, back pain, joint pain, falls and neck pain.  Skin: Negative for itching and rash.  Neurological: Negative for dizziness, tingling, tremors, sensory change, speech change, focal weakness, seizures, loss of consciousness, weakness and headaches.  Endo/Heme/Allergies: Negative for environmental allergies and polydipsia. Does not bruise/bleed easily.  Psychiatric/Behavioral: Positive for depression and memory loss. Negative for suicidal ideas, hallucinations and substance abuse. The patient is nervous/anxious. The patient does not have insomnia.      Past Medical History  Diagnosis Date  . Hypertension   . DJD (degenerative joint disease)   . Hyperlipidemia   . Coronary atherosclerosis of native coronary artery   . Adjustment disorder with depressed mood   . Dementia in conditions classified elsewhere without behavioral disturbance   . Right bundle branch block   . Asthma   . GERD (gastroesophageal reflux disease)   . Unspecified constipation   . Pain in joint, pelvic region and thigh  left  . Osteoporosis   . Unspecified urinary incontinence   . Anemia   . Depression   . Edema   . Urinary tract infection, site not specified   . History of fall   . Constipation   . Insomnia    Past Surgical History  Procedure Laterality Date  . Femur im nail  06/19/2011    Procedure: INTRAMEDULLARY (IM) NAIL FEMORAL;  Surgeon: Harvie Junior, MD;  Location: [facility] OR;  Service: Orthopedics;  Laterality: Left;  . Abdominal hysterectomy    . Cholecystectomy    . Skin cancer excision  02/15/2013    right forehead Dr. Irene Limbo    Social History:   reports that she has never smoked. She has never used smokeless tobacco. She reports that she does not drink alcohol or use illicit drugs.  Medications: Patient's Medications  New Prescriptions   No medications on file  Previous Medications   ACETAMINOPHEN (TYLENOL) 650 MG CR TABLET    Take 650 mg by mouth. Take one every 6 hours as needed for pain   ASPIRIN 81 MG CHEWABLE TABLET    Chew 81 mg by mouth daily.   ATORVASTATIN (LIPITOR) 10 MG TABLET    Take 10 mg by mouth every evening.   CALCIUM-VITAMIN D (OSCAL WITH D) 500-200 MG-UNIT PER TABLET    Take 1 tablet by mouth 3 (three) times daily.   FISH OIL-OMEGA-3 FATTY ACIDS 1000 MG CAPSULE    Take 1 g by mouth daily.   ISOSORBIDE MONONITRATE (IMDUR) 60 MG 24 HR TABLET    Take 30 mg by mouth daily.   MEMANTINE HCL ER (NAMENDA XR) 28 MG CP24    Take by mouth. Take one tablet daily for memory   METOPROLOL (LOPRESSOR) 50 MG TABLET    Take 50 mg by mouth daily.   MIRTAZAPINE (REMERON) 7.5 MG TABLET    Take 7.5 mg by mouth at bedtime.   MULTIPLE VITAMINS-MINERALS (CERTAVITE SENIOR/ANTIOXIDANT PO)    Take 1 tablet by mouth daily.   OMEPRAZOLE-SODIUM BICARBONATE (ZEGERID) 40-1100 MG PER CAPSULE    Take 1 capsule by mouth 2 (two) times daily.   POLYETHYLENE GLYCOL (MIRALAX / GLYCOLAX) PACKET    Take 17 g by mouth daily.  Modified Medications   No medications on file  Discontinued Medications   No medications on file     Physical Exam: Physical Exam  Constitutional: She appears well-developed and well-nourished.  HENT:  Head: Normocephalic and atraumatic.  Eyes: EOM are normal. Pupils are equal, round, and reactive to light. Right eye exhibits no discharge. Left eye exhibits no discharge.  Neck: Normal range of motion. Neck supple. No JVD present. No thyromegaly present.  Cardiovascular: Normal rate and regular rhythm.   No murmur heard. Pulmonary/Chest: No respiratory distress. She has no wheezes. She has no rales.   Abdominal: Soft. Bowel sounds are normal. She exhibits no distension. There is no tenderness.  Musculoskeletal: Normal range of motion. She exhibits edema. She exhibits no tenderness.  Ambulates with Gelardi-no change. Trace edema ankles  Lymphadenopathy:    She has no cervical adenopathy.  Neurological: She is alert. She has normal reflexes. No cranial nerve deficit. She exhibits normal muscle tone. Coordination normal.  Skin: Skin is warm and dry. No rash noted. No erythema.    Filed Vitals:   05/23/14 1544  BP: 148/84  Pulse: 98  Temp: 97 F (36.1 C)  TempSrc: Tympanic  Resp: 18      Labs reviewed: Basic  Metabolic Panel:  Recent Labs  16/10/96 12/21/13 04/07/14  NA 137 142 141  K 4.0 4.1 4.3  BUN CREATININE 1.0 0.8 0.5  TSH  --  1.62  --    CBC:  Recent Labs  10/01/13 12/21/13 04/07/14  WBC 9.2 7.8 9.2  HGB 12.8 12.1 12.6  HCT 37 37 38  PLT 190 191 208   Past Procedures:  03/25/12 X-ray R+L knees, lumbar spine, pelvis: degenerative spondylosis. No acute fracture, malalignment, lytic destructive lesion, or vertebral body compression deformity. Extensive atherosclerotic vascular calcifications at abdominal aorta and branches.Mild degenerative irregularity at the ischial tuberosity regions with minimal osteoarthritis at the sacroiliac joints. Small degenerative spurs at rhe anterior patella. Slight old healed fracture deformity with orthopedic rods and scres in place at rhe distal shaft and metaphysis of the femur.   05/04/12 CXR minimal cardiomegaly without pulmonary vascular congestion or pleural effusion. Patchy density at the right lower lung likely atelectasis or less pneumonitis.   10/02/13 no acute cardiopulmonary abnormality  Assessment/Plan CAD (coronary artery disease) of artery bypass graft No angina since last visit, takes Imdur , ASA , Atorvastatin , Fish oil    Hypertension Controlled,  takes Metoprolol  daily, off HCTZ  12.5mg  daily-trace edema in ankles(hx of HCTZ for edema), monitor Bp daily.      Constipation Stable. Takes MiraLax daily.      Dementia Takes Namenda for memory. Progressing gradually. Needs SNF-Memory Care Unit for care needs.    GERD (gastroesophageal reflux disease) Stable. Takes Zegerid daily.     Edema Trace in ankles. No SOB, phlegm production, or orthopnea. Continue to observe.     Dyslipidemia Takes Atorvastatin.      Family/ Staff Communication: observe the patient  Goals of Care: SNF  Labs/tests ordered: none

## 2014-06-14 ENCOUNTER — Encounter: Payer: Self-pay | Admitting: Nurse Practitioner

## 2014-06-20 ENCOUNTER — Encounter: Payer: Self-pay | Admitting: Nurse Practitioner

## 2014-06-20 ENCOUNTER — Non-Acute Institutional Stay (SKILLED_NURSING_FACILITY): Payer: Medicare Other | Admitting: Nurse Practitioner

## 2014-06-20 DIAGNOSIS — R5383 Other fatigue: Secondary | ICD-10-CM | POA: Diagnosis not present

## 2014-06-20 DIAGNOSIS — I2510 Atherosclerotic heart disease of native coronary artery without angina pectoris: Secondary | ICD-10-CM | POA: Diagnosis not present

## 2014-06-20 DIAGNOSIS — K5900 Constipation, unspecified: Secondary | ICD-10-CM

## 2014-06-20 DIAGNOSIS — K219 Gastro-esophageal reflux disease without esophagitis: Secondary | ICD-10-CM | POA: Diagnosis not present

## 2014-06-20 DIAGNOSIS — F329 Major depressive disorder, single episode, unspecified: Secondary | ICD-10-CM

## 2014-06-20 DIAGNOSIS — E785 Hyperlipidemia, unspecified: Secondary | ICD-10-CM

## 2014-06-20 DIAGNOSIS — F0390 Unspecified dementia without behavioral disturbance: Secondary | ICD-10-CM | POA: Diagnosis not present

## 2014-06-20 DIAGNOSIS — I10 Essential (primary) hypertension: Secondary | ICD-10-CM

## 2014-06-20 DIAGNOSIS — F0280 Dementia in other diseases classified elsewhere without behavioral disturbance: Secondary | ICD-10-CM | POA: Insufficient documentation

## 2014-06-20 DIAGNOSIS — F0393 Unspecified dementia, unspecified severity, with mood disturbance: Secondary | ICD-10-CM | POA: Insufficient documentation

## 2014-06-20 NOTE — Assessment & Plan Note (Signed)
Stable. Takes MiraLax daily.    

## 2014-06-20 NOTE — Assessment & Plan Note (Signed)
Takes Namenda for [employer]. Progressing gradually. Needs SNF-Memory Care Unit for care needs. Repeat MMSE

## 2014-06-20 NOTE — Progress Notes (Signed)
Patient ID: Joann Baker, female   DOB: 10/09/1924, 81 y.o.   MRN: [id number]   Code Status: DNR  Allergies  Allergen Reactions  . Alendronate Sodium     Per mar  . Budesonide-Formoterol Fumarate     Per mar  . Fluticasone Propionate (Inhal)     Per mar  . Mometasone Furoate     Per mar  . Omeprazole     Per mar  . Valium [Diazepam]     Chief Complaint  Patient presents with  . Medical Management of Chronic Issues  . Acute Visit    lethargy, refusal of ADLs, and tearful     HPI: Patient is a 81 y.o. female seen in the SNF at [facility] today for evaluation of tearful, lethargy, refusal of ADL assistance and chronic medical conditions.   The patient as admitted to Memory Care Unit due to her progression of dementia and lack of self care sufficiency in AL  Problem List Items Addressed This Visit    Lethargy - Primary    CBC, CMP, TSH, UA C/S in am. May consider Sertraline  daily if workup unremarkable.       Hypertension    Controlled,  takes Metoprolol  daily, off HCTZ 12.5mg  daily-trace edema in ankles(hx of HCTZ for edema), monitor Bp daily.          GERD (gastroesophageal reflux disease)    Stable. Takes Zegerid daily.         Dyslipidemia    Continue Fish oil and Statin.       Dementia (Chronic)    Takes Namenda for memory. Progressing gradually. Needs SNF-Memory Care Unit for care needs. Repeat MMSE       Coronary atherosclerosis of native coronary artery    No c/o angina since last visited. Takes Isosorbide  daily, Fish oil, statin, ASA      Constipation    Stable. Takes MiraLax daily.           Review of Systems:  Review of Systems  Constitutional: Positive for malaise/fatigue. Negative for fever, chills, weight loss and diaphoresis.  HENT: Positive for hearing loss. Negative for congestion, ear discharge, ear pain, nosebleeds, sore throat and tinnitus.   Eyes: Negative for blurred vision, double vision, photophobia,  pain, discharge and redness.  Respiratory: Negative for cough, hemoptysis, sputum production, shortness of breath, wheezing and stridor.   Cardiovascular: Negative for chest pain, palpitations, orthopnea, claudication, leg swelling and PND.  Gastrointestinal: Negative for heartburn, nausea, vomiting, abdominal pain, diarrhea, constipation, blood in stool and melena.  Genitourinary: Positive for frequency. Negative for dysuria, urgency, hematuria and flank pain.  Musculoskeletal: Positive for joint pain. Negative for myalgias, back pain, falls and neck pain.  Skin: Negative for itching and rash.  Neurological: Positive for weakness. Negative for dizziness, tingling, tremors, sensory change, speech change, focal weakness, seizures, loss of consciousness and headaches.  Endo/Heme/Allergies: Negative for environmental allergies and polydipsia. Does not bruise/bleed easily.  Psychiatric/Behavioral: Positive for depression and memory loss. Negative for suicidal ideas, hallucinations and substance abuse. The patient is not nervous/anxious and does not have insomnia.      Past Medical History  Diagnosis Date  . Hypertension   . DJD (degenerative joint disease)   . Hyperlipidemia   . Coronary atherosclerosis of native coronary artery   . Adjustment disorder with depressed mood   . Dementia in conditions classified elsewhere without behavioral disturbance   . Right bundle branch block   . Asthma   .  GERD (gastroesophageal reflux disease)   . Unspecified constipation   . Pain in joint, pelvic region and thigh     left  . Osteoporosis   . Unspecified urinary incontinence   . Anemia   . Depression   . Edema   . Urinary tract infection, site not specified   . History of fall   . Constipation   . Insomnia    Past Surgical History  Procedure Laterality Date  . Femur im nail  06/19/2011    Procedure: INTRAMEDULLARY (IM) NAIL FEMORAL;  Surgeon: Harvie JuniorJohn L Graves, MD;  Location: [facility] OR;  Service:  Orthopedics;  Laterality: Left;  . Abdominal hysterectomy    . Cholecystectomy    . Skin cancer excision  02/15/2013    right forehead Dr. Irene LimboGoodrich   Social History:   reports that she has never smoked. She has never used smokeless tobacco. She reports that she does not drink alcohol or use illicit drugs.  Medications: Patient's Medications  New Prescriptions   No medications on file  Previous Medications   ACETAMINOPHEN (TYLENOL) 650 MG CR TABLET    Take 650 mg by mouth. Take one every 6 hours as needed for pain   ASPIRIN 81 MG CHEWABLE TABLET    Chew 81 mg by mouth daily.   ATORVASTATIN (LIPITOR) 10 MG TABLET    Take 10 mg by mouth every evening.   CALCIUM-VITAMIN D (OSCAL WITH D) 500-200 MG-UNIT PER TABLET    Take 1 tablet by mouth 3 (three) times daily.   FISH OIL-OMEGA-3 FATTY ACIDS 1000 MG CAPSULE    Take 1 g by mouth daily.   ISOSORBIDE MONONITRATE (IMDUR) 60 MG 24 HR TABLET    Take 30 mg by mouth daily.   MEMANTINE HCL ER (NAMENDA XR) 28 MG CP24    Take by mouth. Take one tablet daily for memory   METOPROLOL (LOPRESSOR) 50 MG TABLET    Take 50 mg by mouth daily.   MIRTAZAPINE (REMERON) 7.5 MG TABLET    Take 7.5 mg by mouth at bedtime.   MULTIPLE VITAMINS-MINERALS (CERTAVITE SENIOR/ANTIOXIDANT PO)    Take 1 tablet by mouth daily.   OMEPRAZOLE-SODIUM BICARBONATE (ZEGERID) 40-1100 MG PER CAPSULE    Take 1 capsule by mouth 2 (two) times daily.   POLYETHYLENE GLYCOL (MIRALAX / GLYCOLAX) PACKET    Take 17 g by mouth daily.  Modified Medications   No medications on file  Discontinued Medications   No medications on file     Physical Exam: Physical Exam  Constitutional: She appears well-developed and well-nourished.  HENT:  Head: Normocephalic and atraumatic.  Eyes: EOM are normal. Pupils are equal, round, and reactive to light. Right eye exhibits no discharge. Left eye exhibits no discharge.  Neck: Normal range of motion. Neck supple. No JVD present. No thyromegaly present.    Cardiovascular: Normal rate and regular rhythm.   No murmur heard. Pulmonary/Chest: No respiratory distress. She has no wheezes. She has no rales.  Abdominal: Soft. Bowel sounds are normal. She exhibits no distension. There is no tenderness.  Musculoskeletal: Normal range of motion. She exhibits edema. She exhibits no tenderness.  Ambulates with [place]-no change. Trace edema ankles  Lymphadenopathy:    She has no cervical adenopathy.  Neurological: She is alert. She has normal reflexes. No cranial nerve deficit. She exhibits normal muscle tone. Coordination normal.  Skin: Skin is warm and dry. No rash noted. No erythema.  Psychiatric: Her mood appears anxious. Her affect is inappropriate. Her  affect is not angry, not blunt and not labile. Her speech is not rapid and/or pressured, not delayed, not tangential and not slurred. She is agitated and withdrawn. She is not aggressive, not hyperactive, not slowed, not actively hallucinating and not combative. Thought content is not paranoid and not delusional. Cognition and memory are impaired. She does not express impulsivity or inappropriate judgment. She exhibits a depressed mood. She expresses no homicidal and no suicidal ideation. She expresses no suicidal plans and no homicidal plans. She is communicative. She exhibits abnormal recent memory. She exhibits normal remote memory. She is attentive.    Filed Vitals:   06/20/14 1128  BP: 160/80  Pulse: 88  Temp: 99 F (37.2 C)  TempSrc: Tympanic  Resp: 18      Labs reviewed: Basic Metabolic Panel:  Recent Labs  16/10/96 12/21/13 04/07/14  NA 137 142 141  K 4.0 4.1 4.3  BUN CREATININE 1.0 0.8 0.5  TSH  --  1.62  --    CBC:  Recent Labs  10/01/13 12/21/13 04/07/14  WBC 9.2 7.8 9.2  HGB 12.8 12.1 12.6  HCT 37 37 38  PLT 190 191 208   Past Procedures:  03/25/12 X-ray R+L knees, lumbar spine, pelvis: degenerative spondylosis. No acute fracture, malalignment, lytic  destructive lesion, or vertebral body compression deformity. Extensive atherosclerotic vascular calcifications at abdominal aorta and branches.Mild degenerative irregularity at the ischial tuberosity regions with minimal osteoarthritis at the sacroiliac joints. Small degenerative spurs at rhe anterior patella. Slight old healed fracture deformity with orthopedic rods and scres in place at rhe distal shaft and metaphysis of the femur.   05/04/12 CXR minimal cardiomegaly without pulmonary vascular congestion or pleural effusion. Patchy density at the right lower lung likely atelectasis or less pneumonitis.   10/02/13 no acute cardiopulmonary abnormality  Assessment/Plan Lethargy CBC, CMP, TSH, UA C/S in am. May consider Sertraline  daily if workup unremarkable.    Coronary atherosclerosis of native coronary artery No c/o angina since last visited. Takes Isosorbide  daily, Fish oil, statin, ASA   Hypertension Controlled,  takes Metoprolol  daily, off HCTZ 12.5mg  daily-trace edema in ankles(hx of HCTZ for edema), monitor Bp daily.       Dementia Takes Namenda for memory. Progressing gradually. Needs SNF-Memory Care Unit for care needs. Repeat MMSE    Constipation Stable. Takes MiraLax daily.     GERD (gastroesophageal reflux disease) Stable. Takes Zegerid daily.      Dyslipidemia Continue Fish oil and Statin.      Family/ Staff Communication: observe the patient  Goals of Care: SNF  Labs/tests ordered: CBC, CMP, TSH, UA C/S

## 2014-06-20 NOTE — Assessment & Plan Note (Signed)
No c/o angina since last visited. Takes Isosorbide 30mg daily, Fish oil, statin, ASA   

## 2014-06-20 NOTE — Assessment & Plan Note (Signed)
CBC, CMP, TSH, UA C/S in am. May consider Sertraline 25mg  daily if workup unremarkable.

## 2014-06-20 NOTE — Assessment & Plan Note (Signed)
Continue Fish oil and Statin.

## 2014-06-20 NOTE — Assessment & Plan Note (Signed)
Stable. Takes Zegerid daily.    

## 2014-06-20 NOTE — Assessment & Plan Note (Signed)
Controlled,  takes Metoprolol 50mg daily, off HCTZ 12.5mg daily-trace edema in ankles(hx of HCTZ for edema), monitor Bp daily.    

## 2014-06-21 LAB — BASIC METABOLIC PANEL
BUN: 17 mg/dL (ref 4–21)
Creatinine: 0.8 mg/dL (ref 0.5–1.1)
GLUCOSE: 87 mg/dL
POTASSIUM: 3.9 mmol/L (ref 3.4–5.3)
Sodium: 145 mmol/L (ref 137–147)

## 2014-06-21 LAB — CBC AND DIFFERENTIAL
HEMATOCRIT: 41 % (ref 36–46)
HEMOGLOBIN: 13.7 g/dL (ref 12.0–16.0)
PLATELETS: 189 10*3/uL (ref 150–399)
WBC: 7.8 10*3/mL

## 2014-06-21 LAB — HEPATIC FUNCTION PANEL
ALK PHOS: 91 U/L (ref 25–125)
ALT: 11 U/L (ref 7–35)
AST: 21 U/L (ref 13–35)
BILIRUBIN, TOTAL: 0.6 mg/dL

## 2014-06-21 LAB — TSH: TSH: 1.64 u[IU]/mL (ref 0.41–5.90)

## 2014-06-28 ENCOUNTER — Other Ambulatory Visit: Payer: Self-pay | Admitting: Nurse Practitioner

## 2014-06-28 DIAGNOSIS — D631 Anemia in chronic kidney disease: Secondary | ICD-10-CM

## 2014-06-28 DIAGNOSIS — E871 Hypo-osmolality and hyponatremia: Secondary | ICD-10-CM

## 2014-06-28 DIAGNOSIS — N189 Chronic kidney disease, unspecified: Secondary | ICD-10-CM

## 2014-07-16 ENCOUNTER — Non-Acute Institutional Stay (SKILLED_NURSING_FACILITY): Payer: Medicare Other | Admitting: Nurse Practitioner

## 2014-07-16 ENCOUNTER — Encounter: Payer: Self-pay | Admitting: Nurse Practitioner

## 2014-07-16 DIAGNOSIS — K5900 Constipation, unspecified: Secondary | ICD-10-CM

## 2014-07-16 DIAGNOSIS — F0280 Dementia in other diseases classified elsewhere without behavioral disturbance: Secondary | ICD-10-CM

## 2014-07-16 DIAGNOSIS — K219 Gastro-esophageal reflux disease without esophagitis: Secondary | ICD-10-CM | POA: Diagnosis not present

## 2014-07-16 DIAGNOSIS — I10 Essential (primary) hypertension: Secondary | ICD-10-CM | POA: Diagnosis not present

## 2014-07-16 DIAGNOSIS — D649 Anemia, unspecified: Secondary | ICD-10-CM | POA: Diagnosis not present

## 2014-07-16 DIAGNOSIS — F329 Major depressive disorder, single episode, unspecified: Secondary | ICD-10-CM | POA: Diagnosis not present

## 2014-07-16 DIAGNOSIS — E871 Hypo-osmolality and hyponatremia: Secondary | ICD-10-CM | POA: Diagnosis not present

## 2014-07-16 DIAGNOSIS — I2510 Atherosclerotic heart disease of native coronary artery without angina pectoris: Secondary | ICD-10-CM | POA: Diagnosis not present

## 2014-07-16 DIAGNOSIS — R609 Edema, unspecified: Secondary | ICD-10-CM

## 2014-07-16 DIAGNOSIS — F0393 Unspecified dementia, unspecified severity, with mood disturbance: Secondary | ICD-10-CM

## 2014-07-16 NOTE — Assessment & Plan Note (Signed)
No c/o angina since last visited. Takes Isosorbide 30mg daily, Fish oil, statin, ASA   

## 2014-07-16 NOTE — Assessment & Plan Note (Signed)
Controlled,  takes Metoprolol 50mg daily, off HCTZ 12.5mg daily-trace edema in ankles(hx of HCTZ for edema), monitor Bp daily.    

## 2014-07-16 NOTE — Assessment & Plan Note (Signed)
07/16/14 crying episodes, poor appetite, lethargy-labs unremarkable-repeat UA C/S and starts Sertraline 25mg  daily.

## 2014-07-16 NOTE — Assessment & Plan Note (Signed)
Stable. Takes MiraLax daily.    

## 2014-07-16 NOTE — Assessment & Plan Note (Signed)
Trace in ankles. No SOB, phlegm production, or orthopnea. Continue to observe.     

## 2014-07-16 NOTE — Assessment & Plan Note (Signed)
Stable. Takes Zegerid daily.    

## 2014-07-16 NOTE — Assessment & Plan Note (Signed)
12/21/13 Hgb 12.1 06/21/14 Hgb 13.7

## 2014-07-16 NOTE — Assessment & Plan Note (Signed)
Takes Namenda for memory. Progressing gradually. Needs SNF-Memory Care Unit for care needs 09/2013 MMSE 10/30 04/24/14 MMSE 8/30  

## 2014-07-16 NOTE — Progress Notes (Signed)
Patient ID: Joann Baker, female   DOB: 05/03/1924, 81 y.o.   MRN: [id number]   Code Status: DNR  Allergies  Allergen Reactions  . Alendronate Sodium     Per mar  . Budesonide-Formoterol Fumarate     Per mar  . Fluticasone Propionate (Inhal)     Per mar  . Mometasone Furoate     Per mar  . Omeprazole     Per mar  . Valium [Diazepam]     Chief Complaint  Patient presents with  . Medical Management of Chronic Issues  . Acute Visit    crying episodes and poor appetite x 1 week    HPI: Patient is a 81 y.o. female [facility] for evaluation of crying episodes and other chronic medical conditions.  Problem List Items Addressed This Visit    GERD (gastroesophageal reflux disease)    Stable. Takes Zegerid daily.          Hyponatremia    12/21/13 Na 142 06/21/14 Na 145       Dementia due to medical condition without behavioral disturbance    Takes Namenda for memory. Progressing gradually. Needs SNF-Memory Care Unit for care needs 09/2013 MMSE 10/30 04/24/14 MMSE 8/30          Hypertension    Controlled,  takes Metoprolol 50mg  daily, off HCTZ 12.5mg  daily-trace edema in ankles(hx of HCTZ for edema), monitor Bp daily.         Coronary atherosclerosis of native coronary artery    No c/o angina since last visited. Takes Isosorbide 30mg  daily, Fish oil, statin, ASA       Anemia    12/21/13 Hgb 12.1 06/21/14 Hgb 13.7       Constipation    Stable. Takes MiraLax daily.         Edema    Trace in ankles. No SOB, phlegm production, or orthopnea. Continue to observe.         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           Review of Systems:  Review of Systems  Constitutional: Negative for fever, chills and diaphoresis.  HENT: Positive for hearing loss. Negative for congestion, ear discharge, ear pain,  nosebleeds, sore throat and tinnitus.   Eyes: Negative for photophobia, pain, discharge and redness.  Respiratory: Negative for cough, shortness of breath, wheezing and stridor.   Cardiovascular: Negative for chest pain, palpitations and leg swelling.  Gastrointestinal: Negative for nausea, vomiting, abdominal pain, diarrhea, constipation and blood in stool.  Endocrine: Negative for polydipsia.  Genitourinary: Positive for frequency. Negative for dysuria, urgency, hematuria and flank pain.  Musculoskeletal: Negative for myalgias, back pain and neck pain.  Skin: Negative for rash.  Allergic/Immunologic: Negative for environmental allergies.  Neurological: Positive for weakness. Negative for dizziness, tremors, seizures and headaches.  Hematological: Does not bruise/bleed easily.  Psychiatric/Behavioral: Positive for confusion. Negative for suicidal ideas and hallucinations. The patient is nervous/anxious.      Past Medical History  Diagnosis Date  . Hypertension   . DJD (degenerative joint disease)   . Hyperlipidemia   . Coronary atherosclerosis of native coronary artery   . Adjustment disorder with depressed mood   . Dementia in conditions classified elsewhere without behavioral disturbance   . Right bundle branch block   . Asthma   . GERD (gastroesophageal reflux disease)   .  Unspecified constipation   . Pain in joint, pelvic region and thigh     left  . Osteoporosis   . Unspecified urinary incontinence   . Anemia   . Depression   . Edema   . Urinary tract infection, site not specified   . History of fall   . Constipation   . Insomnia    Past Surgical History  Procedure Laterality Date  . Femur im nail  06/19/2011    Procedure: INTRAMEDULLARY (IM) NAIL FEMORAL;  Surgeon: Harvie Junior, MD;  Location: [facility] OR;  Service: Orthopedics;  Laterality: Left;  . Abdominal hysterectomy    . Cholecystectomy    . Skin cancer excision  02/15/2013    right forehead Dr. Irene Limbo    Social History:   reports that she has never smoked. She has never used smokeless tobacco. She reports that she does not drink alcohol or use illicit drugs.  No family history on file.  Medications: Patient's Medications  New Prescriptions   No medications on file  Previous Medications   ACETAMINOPHEN (TYLENOL) 650 MG CR TABLET    Take 650 mg by mouth. Take one every 6 hours as needed for pain   ASPIRIN 81 MG CHEWABLE TABLET    Chew 81 mg by mouth daily.   ATORVASTATIN (LIPITOR) 10 MG TABLET    Take 10 mg by mouth every evening.   CALCIUM-VITAMIN D (OSCAL WITH D) 500-200 MG-UNIT PER TABLET    Take 1 tablet by mouth 3 (three) times daily.   FISH OIL-OMEGA-3 FATTY ACIDS 1000 MG CAPSULE    Take 1 g by mouth daily.   ISOSORBIDE MONONITRATE (IMDUR) 60 MG 24 HR TABLET    Take 30 mg by mouth daily.   MEMANTINE HCL ER (NAMENDA XR) 28 MG CP24    Take by mouth. Take one tablet daily for memory   METOPROLOL (LOPRESSOR) 50 MG TABLET    Take 50 mg by mouth daily.   MIRTAZAPINE (REMERON) 7.5 MG TABLET    Take 7.5 mg by mouth at bedtime.   MULTIPLE VITAMINS-MINERALS (CERTAVITE SENIOR/ANTIOXIDANT PO)    Take 1 tablet by mouth daily.   OMEPRAZOLE-SODIUM BICARBONATE (ZEGERID) 40-1100 MG PER CAPSULE    Take 1 capsule by mouth 2 (two) times daily.   POLYETHYLENE GLYCOL (MIRALAX / GLYCOLAX) PACKET    Take 17 g by mouth daily.  Modified Medications   No medications on file  Discontinued Medications   No medications on file     Physical Exam: Physical Exam  Constitutional: She appears well-developed and well-nourished.  HENT:  Head: Normocephalic and atraumatic.  Eyes: EOM are normal. Pupils are equal, round, and reactive to light. Right eye exhibits no discharge. Left eye exhibits no discharge.  Neck: Normal range of motion. Neck supple. No JVD present. No thyromegaly present.  Cardiovascular: Normal rate and regular rhythm.   No murmur heard. Pulmonary/Chest: No respiratory distress. She has no  wheezes. She has no rales.  Abdominal: Soft. Bowel sounds are normal. She exhibits no distension. There is no tenderness.  Musculoskeletal: Normal range of motion. She exhibits edema. She exhibits no tenderness.  Ambulates with [place]-no change. Trace edema ankles  Lymphadenopathy:    She has no cervical adenopathy.  Neurological: She is alert. She has normal reflexes. No cranial nerve deficit. She exhibits normal muscle tone. Coordination normal.  Skin: Skin is warm and dry. No rash noted. No erythema.  Psychiatric: Her mood appears anxious. Her affect is inappropriate. Her affect is  not angry, not blunt and not labile. Her speech is not rapid and/or pressured, not delayed, not tangential and not slurred. She is agitated and withdrawn. She is not aggressive, not hyperactive, not slowed, not actively hallucinating and not combative. Thought content is not paranoid and not delusional. Cognition and memory are impaired. She does not express impulsivity or inappropriate judgment. She exhibits a depressed mood. She expresses no homicidal and no suicidal ideation. She expresses no suicidal plans and no homicidal plans. She is communicative. She exhibits abnormal recent memory. She exhibits normal remote memory. She is attentive.    Filed Vitals:   07/16/14 1614  BP: 160/80  Pulse: 88  Temp: 99 F (37.2 C)  TempSrc: Tympanic  Resp: 20      Labs reviewed: Basic Metabolic Panel:  Recent Labs  16/10/96 04/07/14 06/21/14  NA 142 141 145  K 4.1 4.3 3.9  BUN CREATININE 0.8 0.5 0.8  TSH 1.62  --  1.64   Liver Function Tests:  Recent Labs  04/07/14 06/21/14  AST 20 21  ALT 16 11  ALKPHOS 92 91   No results for input(s): LIPASE, AMYLASE in the last 8760 hours. No results for input(s): AMMONIA in the last 8760 hours. CBC:  Recent Labs  12/21/13 04/07/14 06/21/14  WBC 7.8 9.2 7.8  HGB 12.1 12.6 13.7  HCT 37 38 41  PLT 191 208 189   Lipid Panel: No results for input(s):  CHOL, HDL, LDLCALC, TRIG, CHOLHDL, LDLDIRECT in the last 8760 hours.  Past Procedures:  03/25/12 X-ray R+L knees, lumbar spine, pelvis: degenerative spondylosis. No acute fracture, malalignment, lytic destructive lesion, or vertebral body compression deformity. Extensive atherosclerotic vascular calcifications at abdominal aorta and branches.Mild degenerative irregularity at the ischial tuberosity regions with minimal osteoarthritis at the sacroiliac joints. Small degenerative spurs at rhe anterior patella. Slight old healed fracture deformity with orthopedic rods and scres in place at rhe distal shaft and metaphysis of the femur.   05/04/12 CXR minimal cardiomegaly without pulmonary vascular congestion or pleural effusion. Patchy density at the right lower lung likely atelectasis or less pneumonitis.   10/02/13 no acute cardiopulmonary abnormality  Assessment/Plan Depression due to dementia 07/16/14 crying episodes, poor appetite, lethargy-labs unremarkable-repeat UA C/S and starts Sertraline  daily.     Edema Trace in ankles. No SOB, phlegm production, or orthopnea. Continue to observe.      Constipation Stable. Takes MiraLax daily.      Anemia 12/21/13 Hgb 12.1 06/21/14 Hgb 13.7    Coronary atherosclerosis of native coronary artery No c/o angina since last visited. Takes Isosorbide  daily, Fish oil, statin, ASA    Hypertension Controlled,  takes Metoprolol  daily, off HCTZ 12.5mg  daily-trace edema in ankles(hx of HCTZ for edema), monitor Bp daily.      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 Progressing gradually. Needs SNF-Memory Care Unit for care needs 09/2013 MMSE 10/30 04/24/14 MMSE 8/30       Hyponatremia 12/21/13 Na 142 06/21/14 Na 145    GERD (gastroesophageal reflux disease) Stable. Takes Zegerid daily.         Family/ Staff Communication: observe the patient  Goals of Care:  SNF  Labs/tests ordered: UA C/S

## 2014-07-16 NOTE — Assessment & Plan Note (Signed)
12/21/13 Na 142 06/21/14 Na 145

## 2014-08-13 ENCOUNTER — Encounter: Payer: Self-pay | Admitting: Nurse Practitioner

## 2014-08-13 ENCOUNTER — Non-Acute Institutional Stay (SKILLED_NURSING_FACILITY): Payer: Medicare Other | Admitting: Nurse Practitioner

## 2014-08-13 DIAGNOSIS — E871 Hypo-osmolality and hyponatremia: Secondary | ICD-10-CM | POA: Diagnosis not present

## 2014-08-13 DIAGNOSIS — F329 Major depressive disorder, single episode, unspecified: Secondary | ICD-10-CM | POA: Diagnosis not present

## 2014-08-13 DIAGNOSIS — F0280 Dementia in other diseases classified elsewhere without behavioral disturbance: Secondary | ICD-10-CM | POA: Diagnosis not present

## 2014-08-13 DIAGNOSIS — K5900 Constipation, unspecified: Secondary | ICD-10-CM | POA: Diagnosis not present

## 2014-08-13 DIAGNOSIS — R609 Edema, unspecified: Secondary | ICD-10-CM

## 2014-08-13 DIAGNOSIS — F0393 Unspecified dementia, unspecified severity, with mood disturbance: Secondary | ICD-10-CM

## 2014-08-13 DIAGNOSIS — I25700 Atherosclerosis of coronary artery bypass graft(s), unspecified, with unstable angina pectoris: Secondary | ICD-10-CM

## 2014-08-13 DIAGNOSIS — I2510 Atherosclerotic heart disease of native coronary artery without angina pectoris: Secondary | ICD-10-CM | POA: Diagnosis not present

## 2014-08-13 DIAGNOSIS — D649 Anemia, unspecified: Secondary | ICD-10-CM

## 2014-08-13 DIAGNOSIS — K219 Gastro-esophageal reflux disease without esophagitis: Secondary | ICD-10-CM

## 2014-08-13 DIAGNOSIS — F0390 Unspecified dementia without behavioral disturbance: Secondary | ICD-10-CM | POA: Diagnosis not present

## 2014-08-13 DIAGNOSIS — I10 Essential (primary) hypertension: Secondary | ICD-10-CM | POA: Diagnosis not present

## 2014-08-13 NOTE — Assessment & Plan Note (Signed)
No c/o angina since last visited. Takes Isosorbide 30mg daily, Fish oil, statin, ASA   

## 2014-08-13 NOTE — Assessment & Plan Note (Signed)
Stable. Takes MiraLax daily.    

## 2014-08-13 NOTE — Assessment & Plan Note (Signed)
Stable. Takes Zegerid daily.    

## 2014-08-13 NOTE — Assessment & Plan Note (Signed)
Resolved. Hgb 13.7 06/21/14

## 2014-08-13 NOTE — Assessment & Plan Note (Signed)
Takes Namenda for memory. Progressing gradually. Needs SNF-Memory Care Unit for care needs 09/2013 MMSE 10/30 04/24/14 MMSE 8/30  

## 2014-08-13 NOTE — Assessment & Plan Note (Signed)
Controlled,  takes Metoprolol 50mg daily, off HCTZ 12.5mg daily-trace edema in ankles(hx of HCTZ for edema), monitor Bp daily.    

## 2014-08-13 NOTE — Assessment & Plan Note (Signed)
Trace in ankles. No SOB, phlegm production, or orthopnea. Continue to observe.     

## 2014-08-13 NOTE — Assessment & Plan Note (Signed)
07/16/14 crying episodes, poor appetite, lethargy-labs unremarkable-repeat UA C/S and starts Sertraline 25mg daily.  07/02/14 positive responses seen. 

## 2014-08-13 NOTE — Progress Notes (Signed)
Patient ID: Joann Baker, female   DOB: 1924-04-21, 81 y.o.   MRN: [id number]   Code Status: DNR  Allergies  Allergen Reactions  . Alendronate Sodium     Per mar  . Budesonide-Formoterol Fumarate     Per mar  . Fluticasone Propionate (Inhal)     Per mar  . Mometasone Furoate     Per mar  . Omeprazole     Per mar  . Valium [Diazepam]     Chief Complaint  Patient presents with  . Medical Management of Chronic Issues    HPI: Patient is a 81 y.o. female seen in the SNF at [facility] today for evaluation of chronic medical conditions.  Problem List Items Addressed This Visit    Dementia - Primary (Chronic)    Takes Namenda for memory. Progressing gradually. Needs SNF-Memory Care Unit for care needs 09/2013 MMSE 10/30 04/24/14 MMSE 8/30        CAD (coronary artery disease) of artery bypass graft    No c/o angina since last visited. Takes Isosorbide  daily, Fish oil, statin, ASA        GERD (gastroesophageal reflux disease)    Stable. Takes Zegerid daily.         Hyponatremia    Resolved. Na 145 3/3//16      Dementia due to medical condition without behavioral disturbance    Takes Namenda for memory. Progressing gradually. Needs SNF-Memory Care Unit for care needs 09/2013 MMSE 10/30 04/24/14 MMSE 8/30       Hypertension    Controlled,  takes Metoprolol  daily, off HCTZ 12.5mg  daily-trace edema in ankles(hx of HCTZ for edema), monitor Bp daily.        Coronary atherosclerosis of native coronary artery    No c/o angina since last visited. Takes Isosorbide  daily, Fish oil, statin, ASA       Anemia    Resolved. Hgb 13.7 06/21/14      Constipation    Stable. Takes MiraLax daily.        Edema    Trace in ankles. No SOB, phlegm production, or orthopnea. Continue to observe.         Depression due to dementia    07/16/14 crying episodes, poor appetite, lethargy-labs unremarkable-repeat UA C/S and starts Sertraline  daily.    07/02/14 positive responses seen.            Review of Systems:  Review of Systems  Constitutional: Negative for fever, chills and diaphoresis.  HENT: Positive for hearing loss. Negative for congestion, ear discharge, ear pain, nosebleeds, sore throat and tinnitus.   Eyes: Negative for photophobia, pain, discharge and redness.  Respiratory: Negative for cough, shortness of breath, wheezing and stridor.   Cardiovascular: Negative for chest pain, palpitations and leg swelling.  Gastrointestinal: Negative for nausea, vomiting, abdominal pain, diarrhea, constipation and blood in stool.  Endocrine: Negative for polydipsia.  Genitourinary: Positive for frequency. Negative for dysuria, urgency, hematuria and flank pain.  Musculoskeletal: Negative for myalgias, back pain and neck pain.  Skin: Negative for rash.  Allergic/Immunologic: Negative for environmental allergies.  Neurological: Positive for weakness. Negative for dizziness, tremors, seizures and headaches.  Hematological: Does not bruise/bleed easily.  Psychiatric/Behavioral: Positive for confusion. Negative for suicidal ideas and hallucinations. The patient is nervous/anxious.      Past Medical History  Diagnosis Date  . Hypertension   . DJD (degenerative joint disease)   . Hyperlipidemia   . Coronary atherosclerosis of native coronary  artery   . Adjustment disorder with depressed mood   . Dementia in conditions classified elsewhere without behavioral disturbance   . Right bundle branch block   . Asthma   . GERD (gastroesophageal reflux disease)   . Unspecified constipation   . Pain in joint, pelvic region and thigh     left  . Osteoporosis   . Unspecified urinary incontinence   . Anemia   . Depression   . Edema   . Urinary tract infection, site not specified   . History of fall   . Constipation   . Insomnia    Past Surgical History  Procedure Laterality Date  . Femur im nail  06/19/2011    Procedure:  INTRAMEDULLARY (IM) NAIL FEMORAL;  Surgeon: Harvie JuniorJohn L Graves, MD;  Location: [facility] OR;  Service: Orthopedics;  Laterality: Left;  . Abdominal hysterectomy    . Cholecystectomy    . Skin cancer excision  02/15/2013    right forehead Dr. Irene LimboGoodrich   Social History:   reports that she has never smoked. She has never used smokeless tobacco. She reports that she does not drink alcohol or use illicit drugs.  No family history on file.  Medications: Patient's Medications  New Prescriptions   No medications on file  Previous Medications   ACETAMINOPHEN (TYLENOL) 650 MG CR TABLET    Take 650 mg by mouth. Take one every 6 hours as needed for pain   ASPIRIN 81 MG CHEWABLE TABLET    Chew 81 mg by mouth daily.   ATORVASTATIN (LIPITOR) 10 MG TABLET    Take 10 mg by mouth every evening.   CALCIUM-VITAMIN D (OSCAL WITH D) 500-200 MG-UNIT PER TABLET    Take 1 tablet by mouth 3 (three) times daily.   FISH OIL-OMEGA-3 FATTY ACIDS 1000 MG CAPSULE    Take 1 g by mouth daily.   ISOSORBIDE MONONITRATE (IMDUR) 60 MG 24 HR TABLET    Take 30 mg by mouth daily.   MEMANTINE HCL ER (NAMENDA XR) 28 MG CP24    Take by mouth. Take one tablet daily for memory   METOPROLOL (LOPRESSOR) 50 MG TABLET    Take 50 mg by mouth daily.   MIRTAZAPINE (REMERON) 7.5 MG TABLET    Take 7.5 mg by mouth at bedtime.   MULTIPLE VITAMINS-MINERALS (CERTAVITE SENIOR/ANTIOXIDANT PO)    Take 1 tablet by mouth daily.   OMEPRAZOLE-SODIUM BICARBONATE (ZEGERID) 40-1100 MG PER CAPSULE    Take 1 capsule by mouth 2 (two) times daily.   POLYETHYLENE GLYCOL (MIRALAX / GLYCOLAX) PACKET    Take 17 g by mouth daily.  Modified Medications   No medications on file  Discontinued Medications   No medications on file     Physical Exam: Physical Exam  Constitutional: She appears well-developed and well-nourished.  HENT:  Head: Normocephalic and atraumatic.  Eyes: EOM are normal. Pupils are equal, round, and reactive to light. Right eye exhibits no  discharge. Left eye exhibits no discharge.  Neck: Normal range of motion. Neck supple. No JVD present. No thyromegaly present.  Cardiovascular: Normal rate and regular rhythm.   No murmur heard. Pulmonary/Chest: No respiratory distress. She has no wheezes. She has no rales.  Abdominal: Soft. Bowel sounds are normal. She exhibits no distension. There is no tenderness.  Musculoskeletal: Normal range of motion. She exhibits edema. She exhibits no tenderness.  Ambulates with Bronder-no change. Trace edema ankles  Lymphadenopathy:    She has no cervical adenopathy.  Neurological: She is  alert. She has normal reflexes. No cranial nerve deficit. She exhibits normal muscle tone. Coordination normal.  Skin: Skin is warm and dry. No rash noted. No erythema.  Psychiatric: Her mood appears anxious. Her affect is inappropriate. Her affect is not angry, not blunt and not labile. Her speech is not rapid and/or pressured, not delayed, not tangential and not slurred. She is agitated and withdrawn. She is not aggressive, not hyperactive, not slowed, not actively hallucinating and not combative. Thought content is not paranoid and not delusional. Cognition and memory are impaired. She does not express impulsivity or inappropriate judgment. She exhibits a depressed mood. She expresses no homicidal and no suicidal ideation. She expresses no suicidal plans and no homicidal plans. She is communicative. She exhibits abnormal recent memory. She exhibits normal remote memory. She is attentive.    Filed Vitals:   08/13/14 1438  BP: 120/70  Pulse: 68  Temp: 97.9 F (36.6 C)  TempSrc: Tympanic  Resp: 16      Labs reviewed: Basic Metabolic Panel:  Recent Labs  16/10/96 04/07/14 06/21/14  NA 142 141 145  K 4.1 4.3 3.9  BUN CREATININE 0.8 0.5 0.8  TSH 1.62  --  1.64   Liver Function Tests:  Recent Labs  04/07/14 06/21/14  AST 20 21  ALT 16 11  ALKPHOS 92 91   No results for input(s): LIPASE,  AMYLASE in the last 8760 hours. No results for input(s): AMMONIA in the last 8760 hours. CBC:  Recent Labs  12/21/13 04/07/14 06/21/14  WBC 7.8 9.2 7.8  HGB 12.1 12.6 13.7  HCT 37 38 41  PLT 191 208 189   Lipid Panel: No results for input(s): CHOL, HDL, LDLCALC, TRIG, CHOLHDL, LDLDIRECT in the last 8760 hours.  Past Procedures:  03/25/12 X-ray R+L knees, lumbar spine, pelvis: degenerative spondylosis. No acute fracture, malalignment, lytic destructive lesion, or vertebral body compression deformity. Extensive atherosclerotic vascular calcifications at abdominal aorta and branches.Mild degenerative irregularity at the ischial tuberosity regions with minimal osteoarthritis at the sacroiliac joints. Small degenerative spurs at rhe anterior patella. Slight old healed fracture deformity with orthopedic rods and scres in place at rhe distal shaft and metaphysis of the femur.   05/04/12 CXR minimal cardiomegaly without pulmonary vascular congestion or pleural effusion. Patchy density at the right lower lung likely atelectasis or less pneumonitis.   10/02/13 no acute cardiopulmonary abnormality  Assessment/Plan Dementia Takes Namenda for memory. Progressing gradually. Needs SNF-Memory Care Unit for care needs 09/2013 MMSE 10/30 04/24/14 MMSE 8/30     CAD (coronary artery disease) of artery bypass graft No c/o angina since last visited. Takes Isosorbide  daily, Fish oil, statin, ASA     GERD (gastroesophageal reflux disease) Stable. Takes Zegerid daily.      Hyponatremia Resolved. Na 145 3/3//16   Dementia due to medical condition without behavioral disturbance Takes Namenda for memory. Progressing gradually. Needs SNF-Memory Care Unit for care needs 09/2013 MMSE 10/30 04/24/14 MMSE 8/30    Hypertension Controlled,  takes Metoprolol  daily, off HCTZ 12.5mg  daily-trace edema in ankles(hx of HCTZ for edema), monitor Bp daily.     Coronary atherosclerosis of native  coronary artery No c/o angina since last visited. Takes Isosorbide  daily, Fish oil, statin, ASA    Anemia Resolved. Hgb 13.7 06/21/14   Constipation Stable. Takes MiraLax daily.     Edema Trace in ankles. No SOB, phlegm production, or orthopnea. Continue to observe.      Depression due  to dementia 07/16/14 crying episodes, poor appetite, lethargy-labs unremarkable-repeat UA C/S and starts Sertraline 25mg  daily.  07/02/14 positive responses seen.        Family/ Staff Communication: observe the patient  Goals of Care: SNF [facility]  Labs/tests ordered: none

## 2014-08-13 NOTE — Assessment & Plan Note (Signed)
Resolved. Na 145 3/3//16  

## 2014-08-27 ENCOUNTER — Other Ambulatory Visit: Payer: Self-pay | Admitting: Nurse Practitioner

## 2014-08-27 DIAGNOSIS — M47817 Spondylosis without myelopathy or radiculopathy, lumbosacral region: Secondary | ICD-10-CM

## 2014-08-27 MED ORDER — CALCIUM CARBONATE-VITAMIN D 500-200 MG-UNIT PO TABS: 1.0000 | ORAL_TABLET | Freq: Two times a day (BID) | ORAL | Status: DC

## 2014-09-12 ENCOUNTER — Non-Acute Institutional Stay (SKILLED_NURSING_FACILITY): Payer: Medicare Other | Admitting: Nurse Practitioner

## 2014-09-12 ENCOUNTER — Encounter: Payer: Self-pay | Admitting: Nurse Practitioner

## 2014-09-12 DIAGNOSIS — F0390 Unspecified dementia without behavioral disturbance: Secondary | ICD-10-CM | POA: Diagnosis not present

## 2014-09-12 DIAGNOSIS — F0280 Dementia in other diseases classified elsewhere without behavioral disturbance: Secondary | ICD-10-CM

## 2014-09-12 DIAGNOSIS — R609 Edema, unspecified: Secondary | ICD-10-CM

## 2014-09-12 DIAGNOSIS — K219 Gastro-esophageal reflux disease without esophagitis: Secondary | ICD-10-CM | POA: Diagnosis not present

## 2014-09-12 DIAGNOSIS — K5900 Constipation, unspecified: Secondary | ICD-10-CM | POA: Diagnosis not present

## 2014-09-12 DIAGNOSIS — F329 Major depressive disorder, single episode, unspecified: Secondary | ICD-10-CM | POA: Diagnosis not present

## 2014-09-12 DIAGNOSIS — I25700 Atherosclerosis of coronary artery bypass graft(s), unspecified, with unstable angina pectoris: Secondary | ICD-10-CM | POA: Diagnosis not present

## 2014-09-12 DIAGNOSIS — E871 Hypo-osmolality and hyponatremia: Secondary | ICD-10-CM

## 2014-09-12 DIAGNOSIS — I2510 Atherosclerotic heart disease of native coronary artery without angina pectoris: Secondary | ICD-10-CM

## 2014-09-12 DIAGNOSIS — I10 Essential (primary) hypertension: Secondary | ICD-10-CM

## 2014-09-12 DIAGNOSIS — F0393 Unspecified dementia, unspecified severity, with mood disturbance: Secondary | ICD-10-CM

## 2014-09-12 NOTE — Assessment & Plan Note (Signed)
Resolved. Na 145 3/3//16

## 2014-09-12 NOTE — Assessment & Plan Note (Signed)
Stable. Takes MiraLax daily.    

## 2014-09-12 NOTE — Assessment & Plan Note (Signed)
No c/o angina since last visited. Takes Isosorbide 30mg  daily, Fish oil, statin, ASA

## 2014-09-12 NOTE — Progress Notes (Signed)
Patient ID: Joann Baker, female   DOB: 1925/02/28, 81 y.o.   MRN: [id number]   Code Status: DNR  Allergies  Allergen Reactions  . Alendronate Sodium     Per mar  . Budesonide-Formoterol Fumarate     Per mar  . Fluticasone Propionate (Inhal)     Per mar  . Mometasone Furoate     Per mar  . Omeprazole     Per mar  . Valium [Diazepam]     Chief Complaint  Patient presents with  . Medical Management of Chronic Issues    HPI: Patient is a 82 y.o. female seen in the SNF at [facility] today for evaluation of chronic medical conditions.  Problem List Items Addressed This Visit    Dementia - Primary (Chronic)    Takes Namenda for memory. Progressing gradually. Needs SNF-Memory Care Unit for care needs 09/2013 MMSE 10/30 04/24/14 MMSE 8/30       CAD (coronary artery disease) of artery bypass graft    No c/o angina since last visited. Takes Isosorbide  daily, Fish oil, statin, ASA       GERD (gastroesophageal reflux disease)    Stable. Takes Zegerid daily.         Hyponatremia    Resolved. Na 145 3/3//16       Dementia due to medical condition without behavioral disturbance    Takes Namenda for memory. Progressing gradually. Needs SNF-Memory Care Unit for care needs 09/2013 MMSE 10/30 04/24/14 MMSE 8/30        Hypertension    Controlled,  takes Metoprolol  daily, off HCTZ 12.5mg  daily-trace edema in ankles(hx of HCTZ for edema), monitor Bp daily.         Coronary atherosclerosis of native coronary artery    No c/o angina since last visited. Takes Isosorbide  daily, Fish oil, statin, ASA        Constipation    Stable. Takes MiraLax daily.         Edema    Trace in ankles. No SOB, phlegm production, or orthopnea. Continue to observe.          Depression due to dementia    07/16/14 crying episodes, poor appetite, lethargy-labs unremarkable-repeat UA C/S and starts Sertraline  daily.  07/02/14 positive responses seen.          Review of Systems:  Review of Systems  Constitutional: Negative for fever, chills and diaphoresis.  HENT: Positive for hearing loss. Negative for congestion, ear discharge, ear pain, nosebleeds, sore throat and tinnitus.   Eyes: Negative for photophobia, pain, discharge and redness.  Respiratory: Negative for cough, shortness of breath, wheezing and stridor.   Cardiovascular: Negative for chest pain, palpitations and leg swelling.  Gastrointestinal: Negative for nausea, vomiting, abdominal pain, diarrhea, constipation and blood in stool.  Endocrine: Negative for polydipsia.  Genitourinary: Positive for frequency. Negative for dysuria, urgency, hematuria and flank pain.  Musculoskeletal: Negative for myalgias, back pain and neck pain.  Skin: Negative for rash.  Allergic/Immunologic: Negative for environmental allergies.  Neurological: Positive for weakness. Negative for dizziness, tremors, seizures and headaches.  Hematological: Does not bruise/bleed easily.  Psychiatric/Behavioral: Positive for confusion. Negative for suicidal ideas and hallucinations. The patient is nervous/anxious.      Past Medical History  Diagnosis Date  . Hypertension   . DJD (degenerative joint disease)   . Hyperlipidemia   . Coronary atherosclerosis of native coronary artery   . Adjustment disorder with depressed mood   . Dementia  in conditions classified elsewhere without behavioral disturbance   . Right bundle branch block   . Asthma   . GERD (gastroesophageal reflux disease)   . Unspecified constipation   . Pain in joint, pelvic region and thigh     left  . Osteoporosis   . Unspecified urinary incontinence   . Anemia   . Depression   . Edema   . Urinary tract infection, site not specified   . History of fall   . Constipation   . Insomnia    Past Surgical History  Procedure Laterality Date  . Femur im nail  06/19/2011    Procedure: INTRAMEDULLARY (IM) NAIL FEMORAL;  Surgeon: Harvie Junior,  MD;  Location: [facility] OR;  Service: Orthopedics;  Laterality: Left;  . Abdominal hysterectomy    . Cholecystectomy    . Skin cancer excision  02/15/2013    right forehead Dr. Irene Limbo   Social History:   reports that she has never smoked. She has never used smokeless tobacco. She reports that she does not drink alcohol or use illicit drugs.  No family history on file.  Medications: Patient's Medications  New Prescriptions   No medications on file  Previous Medications   ACETAMINOPHEN (TYLENOL) 650 MG CR TABLET    Take 650 mg by mouth. Take one every 6 hours as needed for pain   ASPIRIN 81 MG CHEWABLE TABLET    Chew 81 mg by mouth daily.   ATORVASTATIN (LIPITOR) 10 MG TABLET    Take 10 mg by mouth every evening.   CALCIUM-VITAMIN D (OSCAL WITH D) 500-200 MG-UNIT PER TABLET    Take 1 tablet by mouth 2 (two) times daily.   FISH OIL-OMEGA-3 FATTY ACIDS 1000 MG CAPSULE    Take 1 g by mouth daily.   ISOSORBIDE MONONITRATE (IMDUR) 60 MG 24 HR TABLET    Take 30 mg by mouth daily.   MEMANTINE HCL ER (NAMENDA XR) 28 MG CP24    Take by mouth. Take one tablet daily for memory   METOPROLOL (LOPRESSOR) 50 MG TABLET    Take 50 mg by mouth daily.   MIRTAZAPINE (REMERON) 7.5 MG TABLET    Take 7.5 mg by mouth at bedtime.   MULTIPLE VITAMINS-MINERALS (CERTAVITE SENIOR/ANTIOXIDANT PO)    Take 1 tablet by mouth daily.   OMEPRAZOLE-SODIUM BICARBONATE (ZEGERID) 40-1100 MG PER CAPSULE    Take 1 capsule by mouth 2 (two) times daily.   POLYETHYLENE GLYCOL (MIRALAX / GLYCOLAX) PACKET    Take 17 g by mouth daily.  Modified Medications   No medications on file  Discontinued Medications   No medications on file     Physical Exam: Physical Exam  Constitutional: She appears well-developed and well-nourished.  HENT:  Head: Normocephalic and atraumatic.  Eyes: EOM are normal. Pupils are equal, round, and reactive to light. Right eye exhibits no discharge. Left eye exhibits no discharge.  Neck: Normal range of  motion. Neck supple. No JVD present. No thyromegaly present.  Cardiovascular: Normal rate and regular rhythm.   No murmur heard. Pulmonary/Chest: No respiratory distress. She has no wheezes. She has no rales.  Abdominal: Soft. Bowel sounds are normal. She exhibits no distension. There is no tenderness.  Musculoskeletal: Normal range of motion. She exhibits edema. She exhibits no tenderness.  Ambulates with Dahlstrom-no change. Trace edema ankles  Lymphadenopathy:    She has no cervical adenopathy.  Neurological: She is alert. She has normal reflexes. No cranial nerve deficit. She exhibits normal muscle  tone. Coordination normal.  Skin: Skin is warm and dry. No rash noted. No erythema.  Psychiatric: Her mood appears anxious. Her affect is inappropriate. Her affect is not angry, not blunt and not labile. Her speech is not rapid and/or pressured, not delayed, not tangential and not slurred. She is agitated and withdrawn. She is not aggressive, not hyperactive, not slowed, not actively hallucinating and not combative. Thought content is not paranoid and not delusional. Cognition and memory are impaired. She does not express impulsivity or inappropriate judgment. She exhibits a depressed mood. She expresses no homicidal and no suicidal ideation. She expresses no suicidal plans and no homicidal plans. She is communicative. She exhibits abnormal recent memory. She exhibits normal remote memory. She is attentive.    Filed Vitals:   09/12/14 1545  BP: 110/80  Pulse: 67  Temp: 96 F (35.6 C)  TempSrc: Tympanic  Resp: 16      Labs reviewed: Basic Metabolic Panel:  Recent Labs  81/19/14 04/07/14 06/21/14  NA 142 141 145  K 4.1 4.3 3.9  BUN CREATININE 0.8 0.5 0.8  TSH 1.62  --  1.64   Liver Function Tests:  Recent Labs  04/07/14 06/21/14  AST 20 21  ALT 16 11  ALKPHOS 92 91   No results for input(s): LIPASE, AMYLASE in the last 8760 hours. No results for input(s): AMMONIA in  the last 8760 hours. CBC:  Recent Labs  12/21/13 04/07/14 06/21/14  WBC 7.8 9.2 7.8  HGB 12.1 12.6 13.7  HCT 37 38 41  PLT 191 208 189   Lipid Panel: No results for input(s): CHOL, HDL, LDLCALC, TRIG, CHOLHDL, LDLDIRECT in the last 8760 hours.  Past Procedures:  03/25/12 X-ray R+L knees, lumbar spine, pelvis: degenerative spondylosis. No acute fracture, malalignment, lytic destructive lesion, or vertebral body compression deformity. Extensive atherosclerotic vascular calcifications at abdominal aorta and branches.Mild degenerative irregularity at the ischial tuberosity regions with minimal osteoarthritis at the sacroiliac joints. Small degenerative spurs at rhe anterior patella. Slight old healed fracture deformity with orthopedic rods and scres in place at rhe distal shaft and metaphysis of the femur.   05/04/12 CXR minimal cardiomegaly without pulmonary vascular congestion or pleural effusion. Patchy density at the right lower lung likely atelectasis or less pneumonitis.   10/02/13 no acute cardiopulmonary abnormality  Assessment/Plan Dementia Takes Namenda for memory. Progressing gradually. Needs SNF-Memory Care Unit for care needs 09/2013 MMSE 10/30 04/24/14 MMSE 8/30    CAD (coronary artery disease) of artery bypass graft No c/o angina since last visited. Takes Isosorbide  daily, Fish oil, statin, ASA    GERD (gastroesophageal reflux disease) Stable. Takes Zegerid daily.      Hyponatremia Resolved. Na 145 3/3//16    Dementia due to medical condition without behavioral disturbance Takes Namenda for memory. Progressing gradually. Needs SNF-Memory Care Unit for care needs 09/2013 MMSE 10/30 04/24/14 MMSE 8/30     Hypertension Controlled,  takes Metoprolol  daily, off HCTZ 12.5mg  daily-trace edema in ankles(hx of HCTZ for edema), monitor Bp daily.      Coronary atherosclerosis of native coronary artery No c/o angina since last visited. Takes Isosorbide   daily, Fish oil, statin, ASA     Constipation Stable. Takes MiraLax daily.      Edema Trace in ankles. No SOB, phlegm production, or orthopnea. Continue to observe.       Depression due to dementia 07/16/14 crying episodes, poor appetite, lethargy-labs unremarkable-repeat UA C/S and starts Sertraline  daily.  07/02/14 positive responses seen.     Family/ Staff Communication: observe the patient  Goals of Care: SNF [facility]  Labs/tests ordered: none

## 2014-09-12 NOTE — Assessment & Plan Note (Signed)
No c/o angina since last visited. Takes Isosorbide 30mg daily, Fish oil, statin, ASA   

## 2014-09-12 NOTE — Assessment & Plan Note (Signed)
Stable. Takes Zegerid daily.    

## 2014-09-12 NOTE — Assessment & Plan Note (Signed)
Trace in ankles. No SOB, phlegm production, or orthopnea. Continue to observe.

## 2014-09-12 NOTE — Assessment & Plan Note (Signed)
Takes Namenda for memory. Progressing gradually. Needs SNF-Memory Care Unit for care needs 09/2013 MMSE 10/30 04/24/14 MMSE 8/30  

## 2014-09-12 NOTE — Assessment & Plan Note (Signed)
07/16/14 crying episodes, poor appetite, lethargy-labs unremarkable-repeat UA C/S and starts Sertraline 25mg  daily.  07/02/14 positive responses seen.

## 2014-09-12 NOTE — Assessment & Plan Note (Signed)
Takes Namenda for [employer]. Progressing gradually. Needs SNF-Memory Care Unit for care needs 09/2013 MMSE 10/30 04/24/14 MMSE 8/30

## 2014-09-12 NOTE — Assessment & Plan Note (Signed)
Controlled,  takes Metoprolol 50mg  daily, off HCTZ 12.5mg  daily-trace edema in ankles(hx of HCTZ for edema), monitor Bp daily.

## 2014-10-15 ENCOUNTER — Encounter: Payer: Self-pay | Admitting: Internal Medicine

## 2014-10-15 ENCOUNTER — Non-Acute Institutional Stay (SKILLED_NURSING_FACILITY): Payer: Medicare Other | Admitting: Internal Medicine

## 2014-10-15 DIAGNOSIS — K5900 Constipation, unspecified: Secondary | ICD-10-CM | POA: Diagnosis not present

## 2014-10-15 DIAGNOSIS — F0390 Unspecified dementia without behavioral disturbance: Secondary | ICD-10-CM

## 2014-10-15 DIAGNOSIS — R609 Edema, unspecified: Secondary | ICD-10-CM | POA: Diagnosis not present

## 2014-10-15 DIAGNOSIS — I10 Essential (primary) hypertension: Secondary | ICD-10-CM | POA: Diagnosis not present

## 2014-10-15 DIAGNOSIS — F0280 Dementia in other diseases classified elsewhere without behavioral disturbance: Secondary | ICD-10-CM | POA: Diagnosis not present

## 2014-10-15 DIAGNOSIS — F329 Major depressive disorder, single episode, unspecified: Secondary | ICD-10-CM | POA: Diagnosis not present

## 2014-10-15 DIAGNOSIS — I25700 Atherosclerosis of coronary artery bypass graft(s), unspecified, with unstable angina pectoris: Secondary | ICD-10-CM

## 2014-10-15 DIAGNOSIS — F0393 Unspecified dementia, unspecified severity, with mood disturbance: Secondary | ICD-10-CM

## 2014-10-15 DIAGNOSIS — I2510 Atherosclerotic heart disease of native coronary artery without angina pectoris: Secondary | ICD-10-CM | POA: Diagnosis not present

## 2014-10-15 DIAGNOSIS — R634 Abnormal weight loss: Secondary | ICD-10-CM | POA: Diagnosis not present

## 2014-10-15 DIAGNOSIS — K219 Gastro-esophageal reflux disease without esophagitis: Secondary | ICD-10-CM

## 2014-10-15 NOTE — Progress Notes (Signed)
Patient ID: Joann Baker, female   DOB: 09/20/1924, 81 y.o.   MRN: [id number]    [facility]  Place of Service: SNF (31)     Allergies  Allergen Reactions  . Alendronate Sodium     Per mar  . Budesonide-Formoterol Fumarate     Per mar  . Fluticasone Propionate (Inhal)     Per mar  . Mometasone Furoate     Per mar  . Omeprazole     Per mar  . Valium [Diazepam]     Chief Complaint  Patient presents with  . Medical Management of Chronic Issues    HPI:  Dementia, without behavioral disturbance: Progressive  Essential hypertension, benign: Controlled  Coronary artery disease involving coronary bypass graft of native heart with unstable angina pectoris: No reported episodes of chest pain or palpitations  Constipation, unspecified constipation type: Controlled  Gastroesophageal reflux disease without esophagitis: Stable  Edema: None  Depression due to dementia: Taking Sertraline 25 mg, which initially improved symptoms, continues to have poor appetite - 10# wt loss over last 4 months despite addition of Resource BID, stays in bed most of morning, occasionally combative with staff  Loss of weight: Previously on Mirtazapine 7.5 mg - tolerated well for Insomnia. Trialed off medication starting 12/15. Has since been loosing weight - 05/23/14 148 lbs -->09/19/14 138 lbs   Medications: Patient's Medications  New Prescriptions   No medications on file  Previous Medications   ACETAMINOPHEN (TYLENOL) 650 MG CR TABLET    Take 650 mg by mouth. Take one every 6 hours as needed for pain   ASPIRIN 81 MG CHEWABLE TABLET    Chew 81 mg by mouth daily.   ATORVASTATIN (LIPITOR) 10 MG TABLET    Take 10 mg by mouth every evening.   CALCIUM-VITAMIN D (OSCAL WITH D) 500-200 MG-UNIT PER TABLET    Take 1 tablet by mouth 2 (two) times daily.   ISOSORBIDE MONONITRATE (IMDUR) 60 MG 24 HR TABLET    Take 30 mg by mouth daily.   MEMANTINE (NAMENDA) 10 MG  TABLET    Take 10 mg by mouth 2 (two) times daily.   METOPROLOL (LOPRESSOR) 50 MG TABLET    Take 25 mg by mouth daily.   MULTIPLE VITAMINS-MINERALS (CERTAVITE SENIOR/ANTIOXIDANT PO)    Take 1 tablet by mouth daily.   OMEPRAZOLE-SODIUM BICARBONATE (ZEGERID) 40-1100 MG PER CAPSULE    Take 1 capsule by mouth 2 (two) times daily.   POLYETHYLENE GLYCOL (MIRALAX / GLYCOLAX) PACKET    Take 17 g by mouth daily.   SERTRALINE (ZOLOFT) 25 MG TABLET    Take 25 mg by mouth daily.  Modified Medications   No medications on file  Discontinued Medications   FISH OIL-OMEGA-3 FATTY ACIDS 1000 MG CAPSULE    Take 1 g by mouth daily.   MEMANTINE HCL ER (NAMENDA XR) 28 MG CP24    Take by mouth. Take one tablet daily for memory   MIRTAZAPINE (REMERON) 7.5 MG TABLET    Take 7.5 mg by mouth at bedtime.     Review of Systems  Constitutional: Positive for activity change, appetite change and unexpected weight change. Negative for fever, chills, diaphoresis and fatigue.  HENT: Negative.   Eyes: Negative.   Respiratory: Negative.   Cardiovascular: Negative for chest pain, palpitations and leg swelling.  Gastrointestinal: Negative.   Endocrine: Negative.   Genitourinary: Positive for urgency. Negative for dysuria, frequency, flank pain and pelvic  pain.  Musculoskeletal: Positive for back pain, arthralgias and gait problem.  Skin: Positive for wound (removal of skin cancer of the right forehead 02/15/13). Negative for pallor and rash.  Allergic/Immunologic: Negative.   Neurological: Positive for speech difficulty (Difficulty with wored finding.).       Dementia. 09/10/12 MMSE; total 19/30   Hematological: Negative.   Psychiatric/Behavioral: Positive for confusion.    Filed Vitals:   10/02/14 1022  BP: 122/72  Pulse: 70  Temp: 98.6 F (37 C)  Resp: 20  Weight: 138 lb 12.8 oz (62.959 kg)   Body mass index is 25.38 kg/(m^2).  Physical Exam  Constitutional: She appears well-developed and well-nourished.    HENT:  Head: Normocephalic and atraumatic.  Eyes: EOM are normal. Pupils are equal, round, and reactive to light. Right eye exhibits no discharge. Left eye exhibits no discharge.  Neck: Normal range of motion. Neck supple. No JVD present. No thyromegaly present.  Cardiovascular: Normal rate and regular rhythm.   No murmur heard. Pulmonary/Chest: No respiratory distress. She has no wheezes. She has no rales.  Abdominal: Soft. Bowel sounds are normal. She exhibits no distension. There is no tenderness.  Musculoskeletal: Normal range of motion. She exhibits no edema or tenderness.  Lymphadenopathy:    She has no cervical adenopathy.  Neurological: She is alert. She has normal reflexes. No cranial nerve deficit. She exhibits normal muscle tone. Coordination normal.  Skin: Skin is warm and dry. No rash noted. No erythema.  Healed site of excisional biopsy of skin cancer of right forehead. 1.25 inch scar.     Labs reviewed: No visits with results within 3 Month(s) from this visit. Latest known visit with results is:  Lab on 06/28/2014  Component Date Value Ref Range Status  . Hemoglobin 06/21/2014 13.7  12.0 - 16.0 g/dL Final  . HCT 11/91/4782 41  36 - 46 % Final  . Platelets 06/21/2014 189  150 - 399 K/L Final  . WBC 06/21/2014 7.8   Final  . Glucose 06/21/2014 87   Final  . BUN 06/21/2014 17  4 - 21 mg/dL Final  . Creatinine 95/62/1308 0.8  0.5 - 1.1 mg/dL Final  . Potassium 65/78/4696 3.9  3.4 - 5.3 mmol/L Final  . Sodium 06/21/2014 145  137 - 147 mmol/L Final  . Alkaline Phosphatase 06/21/2014 91  25 - 125 U/L Final  . ALT 06/21/2014 11  7 - 35 U/L Final  . AST 06/21/2014 21  13 - 35 U/L Final  . Bilirubin, Total 06/21/2014 0.6   Final  . TSH 06/21/2014 1.64  0.41 - 5.90 uIU/mL Final     Assessment/Plan 1. Dementia, without behavioral disturbance Continue Memantine 10 mg BID  2. Essential hypertension, benign Decrease Metoprolol to 25 mg qd  3. Coronary artery disease  involving coronary bypass graft of native heart with unstable angina pectoris Off fish oil Continue Isosorbide mononitrate, atorvastatin  4. Constipation, unspecified constipation type Controlled with Miralax  6. Gastroesophageal reflux disease without esophagitis Continue Omeprazole 20 mg  7. Edema Resolved, off Hctz  8. Depression due to dementia D/c Sertraline Restart Mirtazapine   9. Loss of Weight Will resume Mirazapine Continue to encourage intake of calories

## 2015-01-01 ENCOUNTER — Encounter: Payer: Self-pay | Admitting: Nurse Practitioner

## 2015-01-01 ENCOUNTER — Non-Acute Institutional Stay (SKILLED_NURSING_FACILITY): Payer: Medicare Other | Admitting: Nurse Practitioner

## 2015-01-01 DIAGNOSIS — K5900 Constipation, unspecified: Secondary | ICD-10-CM | POA: Diagnosis not present

## 2015-01-01 DIAGNOSIS — I2510 Atherosclerotic heart disease of native coronary artery without angina pectoris: Secondary | ICD-10-CM | POA: Diagnosis not present

## 2015-01-01 DIAGNOSIS — I10 Essential (primary) hypertension: Secondary | ICD-10-CM

## 2015-01-01 DIAGNOSIS — K219 Gastro-esophageal reflux disease without esophagitis: Secondary | ICD-10-CM

## 2015-01-01 DIAGNOSIS — F329 Major depressive disorder, single episode, unspecified: Secondary | ICD-10-CM

## 2015-01-01 DIAGNOSIS — F0393 Unspecified dementia, unspecified severity, with mood disturbance: Secondary | ICD-10-CM

## 2015-01-01 DIAGNOSIS — F0280 Dementia in other diseases classified elsewhere without behavioral disturbance: Secondary | ICD-10-CM

## 2015-01-01 NOTE — Assessment & Plan Note (Signed)
Restless, pacing, increased confusion since the initial positive response to Mirtazapine, may consider increase Mirtazapine to  if CBC, CMP, TSH, UA C/S unremarkable.

## 2015-01-01 NOTE — Progress Notes (Signed)
Patient ID: Joann Baker, female   DOB: 31-May-1924, 81 y.o.   MRN: [id number]  Location:  SNF [facility] Provider:  Chipper Oman NP  Code Status:  DNR Goals of care: Advanced Directive information    Chief Complaint  Patient presents with  . Medical Management of Chronic Issues  . Acute Visit    increased confusion, restless, pacing      HPI: Patient is a 81 y.o. female seen in the SNF at [facility] today for evaluation of  Increased confusion, packing, restless, not sleep well for a few nights, denied dysuria, abd or back pain, no noted fever, nausea, vomiting, or focal neurological deficits.   Review of Systems:  Review of Systems  Constitutional: Positive for weight loss. Negative for fever, chills and diaphoresis.  HENT: Negative.  Negative for congestion and hearing loss.   Eyes: Negative.  Negative for pain and discharge.  Respiratory: Negative.  Negative for cough, shortness of breath and wheezing.   Cardiovascular: Negative for chest pain, palpitations and leg swelling.  Gastrointestinal: Negative.  Negative for vomiting, abdominal pain, diarrhea and constipation.  Genitourinary: Positive for urgency. Negative for dysuria, frequency and flank pain.  Musculoskeletal: Positive for back pain. Negative for myalgias, joint pain and neck pain.  Skin: Negative for rash.  Neurological: Negative for tingling, tremors, sensory change, speech change, focal weakness and headaches.       Dementia. 09/10/12 MMSE; total 19/30   Psychiatric/Behavioral: Positive for memory loss. Negative for depression and hallucinations. The patient is not nervous/anxious.     Past Medical History  Diagnosis Date  . Hypertension   . DJD (degenerative joint disease)   . Hyperlipidemia   . Coronary atherosclerosis of native coronary artery   . Adjustment disorder with depressed mood   . Dementia in conditions classified elsewhere without behavioral disturbance   . Right bundle branch block   .  Asthma   . GERD (gastroesophageal reflux disease)   . Unspecified constipation   . Pain in joint, pelvic region and thigh     left  . Osteoporosis   . Unspecified urinary incontinence   . Anemia   . Depression   . Edema   . Urinary tract infection, site not specified   . History of fall   . Constipation   . Insomnia     Patient Active Problem List   Diagnosis Date Noted  . Loss of weight 10/15/2014  . Depression due to dementia 06/20/2014  . Seborrheic dermatitis, unspecified 12/14/2013  . Edema 12/14/2013  . Neuropathy of both upper extremities 12/14/2013  . Dementia due to medical condition without behavioral disturbance   . Hypertension   . DJD (degenerative joint disease)   . Coronary atherosclerosis of native coronary artery   . Anemia   . History of fall   . Constipation   . Hyponatremia 05/15/2013  . CAP (community acquired pneumonia) 05/11/2013  . Dyslipidemia 03/02/2013  . Skin lesion of scalp 01/02/2013  . GERD (gastroesophageal reflux disease) 01/02/2013  . Unspecified constipation 08/25/2012  . Urinary tract infection, site not specified 08/03/2012  . Insomnia 08/03/2012  . CAD (coronary artery disease) of artery bypass graft 08/03/2012  . Osteoarthritis of left hip 07/14/2012  . Lumbosacral spondylosis without myelopathy 07/14/2012  . Essential hypertension, benign 07/14/2012  . Urinary incontinence due to cognitive impairment 06/23/2011  . Dementia 06/23/2011  . Femur fracture, left 06/19/2011    Allergies  Allergen Reactions  . Alendronate Sodium  Per mar  . Budesonide-Formoterol Fumarate     Per mar  . Fluticasone Propionate (Inhal)     Per mar  . Mometasone Furoate     Per mar  . Omeprazole     Per mar  . Valium [Diazepam]     Medications: Patient's Medications  New Prescriptions   No medications on file  Previous Medications   ACETAMINOPHEN (TYLENOL) 650 MG CR TABLET    Take 650 mg by mouth. Take one every 6 hours as needed for  pain   ASPIRIN 81 MG CHEWABLE TABLET    Chew 81 mg by mouth daily.   ATORVASTATIN (LIPITOR) 10 MG TABLET    Take 10 mg by mouth every evening.   CALCIUM-VITAMIN D (OSCAL WITH D) 500-200 MG-UNIT PER TABLET    Take 1 tablet by mouth 2 (two) times daily.   ISOSORBIDE MONONITRATE (IMDUR) 60 MG 24 HR TABLET    Take 30 mg by mouth daily.   MEMANTINE (NAMENDA) 10 MG TABLET    Take 10 mg by mouth 2 (two) times daily.   METOPROLOL (LOPRESSOR) 50 MG TABLET    Take 25 mg by mouth daily.   MULTIPLE VITAMINS-MINERALS (CERTAVITE SENIOR/ANTIOXIDANT PO)    Take 1 tablet by mouth daily.   OMEPRAZOLE-SODIUM BICARBONATE (ZEGERID) 40-1100 MG PER CAPSULE    Take 1 capsule by mouth 2 (two) times daily.   POLYETHYLENE GLYCOL (MIRALAX / GLYCOLAX) PACKET    Take 17 g by mouth daily.   SERTRALINE (ZOLOFT) 25 MG TABLET    Take 25 mg by mouth daily.  Modified Medications   No medications on file  Discontinued Medications   No medications on file    Physical Exam: Filed Vitals:   01/01/15 1527  BP: 130/65  Pulse: 82  Temp: 96 F (35.6 C)  TempSrc: Tympanic  Resp: 18   There is no weight on file to calculate BMI.  Physical Exam  Constitutional: She appears well-developed and well-nourished.  HENT:  Head: Normocephalic and atraumatic.  Eyes: EOM are normal. Pupils are equal, round, and reactive to light. Right eye exhibits no discharge. Left eye exhibits no discharge.  Neck: Normal range of motion. Neck supple. No JVD present. No thyromegaly present.  Cardiovascular: Normal rate and regular rhythm.   No murmur heard. Pulmonary/Chest: No respiratory distress. She has no wheezes. She has no rales.  Abdominal: Soft. Bowel sounds are normal. She exhibits no distension. There is no tenderness.  Musculoskeletal: Normal range of motion. She exhibits no edema or tenderness.  Lymphadenopathy:    She has no cervical adenopathy.  Neurological: She is alert. She has normal reflexes. No cranial nerve deficit. She  exhibits normal muscle tone. Coordination normal.  Skin: Skin is warm and dry. No rash noted. No erythema.  Healed site of excisional biopsy of skin cancer of right forehead. 1.25 inch scar.  Psychiatric:  Increased confusion, pacing, restlessness, not sleep much at night     Labs reviewed: Basic Metabolic Panel:  Recent Labs  16/10/96 06/21/14  NA 141 145  K 4.3 3.9  BUN 17 17  CREATININE 0.5 0.8    Liver Function Tests:  Recent Labs  04/07/14 06/21/14  AST 20 21  ALT 16 11  ALKPHOS 92 91    CBC:  Recent Labs  04/07/14 06/21/14  WBC 9.2 7.8  HGB 12.6 13.7  HCT 38 41  PLT 208 189    Lab Results  Component Value Date   TSH 1.64 06/21/2014  No results found for: HGBA1C No results found for: CHOL, HDL, LDLCALC, LDLDIRECT, TRIG, CHOLHDL  Significant Diagnostic Results since last visit: none  Patient Care Team: Kimber Relic, MD as PCP - General (Internal Medicine) [facility] Euan Wandler X, NP as Nurse Practitioner (Nurse Practitioner)  Assessment/Plan Problem List Items Addressed This Visit    GERD (gastroesophageal reflux disease) - Primary    Stable. Continue Famotidine 20mg  daily.       Dementia due to medical condition without behavioral disturbance    09/2013 MMSE 10/30 04/24/14 MMSE 8/30 Progressing, continue Namenda for now.       Hypertension    Controlled, continue Metoprolol 25mg  daily.       Coronary atherosclerosis of native coronary artery    No c/o angina since last visited. Continue Isosorbide 30mg  daily, statin, ASA      Constipation    Stable. Continue MiraLax daily.       Depression due to dementia    Restless, pacing, increased confusion since the initial positive response to Mirtazapine, may consider increase Mirtazapine to 15mg  if CBC, CMP, TSH, UA C/S unremarkable.           Family/ staff Communication: observe the patient.   Labs/tests ordered:  CBC, CMP, TSH, UA C/S  [facility] Deondrae Mcgrail NP Geriatrics [facility] [address], [place] [address] On Call:  [phone] & follow prompts after 5pm & weekends Office Phone:  [phone] Office Fax:  [phone]

## 2015-01-01 NOTE — Assessment & Plan Note (Signed)
Stable. Continue Famotidine 20mg daily.  

## 2015-01-01 NOTE — Assessment & Plan Note (Signed)
No c/o angina since last visited. Continue Isosorbide  daily, statin, ASA

## 2015-01-01 NOTE — Assessment & Plan Note (Signed)
Stable. Continue MiraLax daily.  

## 2015-01-01 NOTE — Assessment & Plan Note (Signed)
09/2013 MMSE 10/30 04/24/14 MMSE 8/30 Progressing, continue Namenda for now 

## 2015-01-01 NOTE — Assessment & Plan Note (Signed)
Controlled, continue Metoprolol 25mg daily 

## 2015-01-03 LAB — HEPATIC FUNCTION PANEL
ALK PHOS: 101 U/L (ref 25–125)
ALT: 17 U/L (ref 7–35)
AST: 24 U/L (ref 13–35)
Bilirubin, Total: 0.5 mg/dL

## 2015-01-03 LAB — CBC AND DIFFERENTIAL
HCT: 38 % (ref 36–46)
HEMOGLOBIN: 12.7 g/dL (ref 12.0–16.0)
Platelets: 176 10*3/uL (ref 150–399)
WBC: 7.1 10*3/mL

## 2015-01-03 LAB — BASIC METABOLIC PANEL
BUN: 22 mg/dL — AB (ref 4–21)
CREATININE: 1 mg/dL (ref 0.5–1.1)
Glucose: 107 mg/dL
POTASSIUM: 4.2 mmol/L (ref 3.4–5.3)
Sodium: 144 mmol/L (ref 137–147)

## 2015-01-03 LAB — TSH: TSH: 2.01 u[IU]/mL (ref 0.41–5.90)

## 2015-01-07 ENCOUNTER — Non-Acute Institutional Stay (SKILLED_NURSING_FACILITY): Payer: Medicare Other | Admitting: Nurse Practitioner

## 2015-01-07 ENCOUNTER — Encounter: Payer: Self-pay | Admitting: Nurse Practitioner

## 2015-01-07 DIAGNOSIS — I10 Essential (primary) hypertension: Secondary | ICD-10-CM | POA: Diagnosis not present

## 2015-01-07 DIAGNOSIS — F0280 Dementia in other diseases classified elsewhere without behavioral disturbance: Secondary | ICD-10-CM | POA: Diagnosis not present

## 2015-01-07 DIAGNOSIS — R609 Edema, unspecified: Secondary | ICD-10-CM

## 2015-01-07 DIAGNOSIS — F0393 Unspecified dementia, unspecified severity, with mood disturbance: Secondary | ICD-10-CM

## 2015-01-07 DIAGNOSIS — K219 Gastro-esophageal reflux disease without esophagitis: Secondary | ICD-10-CM | POA: Diagnosis not present

## 2015-01-07 DIAGNOSIS — F329 Major depressive disorder, single episode, unspecified: Secondary | ICD-10-CM

## 2015-01-07 DIAGNOSIS — K5900 Constipation, unspecified: Secondary | ICD-10-CM | POA: Diagnosis not present

## 2015-01-07 DIAGNOSIS — E871 Hypo-osmolality and hyponatremia: Secondary | ICD-10-CM | POA: Diagnosis not present

## 2015-01-07 DIAGNOSIS — N390 Urinary tract infection, site not specified: Secondary | ICD-10-CM

## 2015-01-07 NOTE — Assessment & Plan Note (Signed)
Stable. Continue Famotidine 20mg daily.  

## 2015-01-07 NOTE — Assessment & Plan Note (Signed)
Stable. Continue MiraLax daily.  

## 2015-01-07 NOTE — Assessment & Plan Note (Signed)
Controlled, continue Metoprolol 25mg daily 

## 2015-01-07 NOTE — Progress Notes (Signed)
Patient ID: Joann Baker, female   DOB: December 02, 1924, 81 y.o.   MRN: [id number]  Location:  SNF [facility] Provider:  Chipper Oman NP  Code Status:  DNR Goals of care: Advanced Directive information    Chief Complaint  Patient presents with  . Medical Management of Chronic Issues  . Acute Visit    UTI     HPI: Patient is a 81 y.o. female seen in the SNF at [facility] today for evaluation of  UTI, 01/06/15 urine culture showed enterococcus, 75,000c/ml, the patient has increased restlessness and pacing, but unable to voice her discomfort.   Review of Systems:  Review of Systems  Constitutional: Positive for weight loss. Negative for fever, chills and diaphoresis.  HENT: Negative.  Negative for congestion and hearing loss.   Eyes: Negative.  Negative for pain and discharge.  Respiratory: Negative.  Negative for cough, shortness of breath and wheezing.   Cardiovascular: Negative for chest pain, palpitations and leg swelling.  Gastrointestinal: Negative.  Negative for vomiting, abdominal pain, diarrhea and constipation.  Genitourinary: Positive for urgency. Negative for dysuria, frequency and flank pain.  Musculoskeletal: Positive for back pain. Negative for myalgias, joint pain and neck pain.  Skin: Negative for rash.  Neurological: Negative for tingling, tremors, sensory change, speech change, focal weakness and headaches.       Dementia. 09/10/12 MMSE; total 19/30   Psychiatric/Behavioral: Positive for memory loss. Negative for depression and hallucinations. The patient is not nervous/anxious.     Past Medical History  Diagnosis Date  . Hypertension   . DJD (degenerative joint disease)   . Hyperlipidemia   . Coronary atherosclerosis of native coronary artery   . Adjustment disorder with depressed mood   . Dementia in conditions classified elsewhere without behavioral disturbance   . Right bundle branch block   . Asthma   . GERD (gastroesophageal reflux disease)   .  Unspecified constipation   . Pain in joint, pelvic region and thigh     left  . Osteoporosis   . Unspecified urinary incontinence   . Anemia   . Depression   . Edema   . Urinary tract infection, site not specified   . History of fall   . Constipation   . Insomnia     Patient Active Problem List   Diagnosis Date Noted  . Loss of weight 10/15/2014  . Depression due to dementia 06/20/2014  . Seborrheic dermatitis, unspecified 12/14/2013  . Edema 12/14/2013  . Neuropathy of both upper extremities 12/14/2013  . Dementia due to medical condition without behavioral disturbance   . Hypertension   . DJD (degenerative joint disease)   . Coronary atherosclerosis of native coronary artery   . Anemia   . History of fall   . Constipation   . Hyponatremia 05/15/2013  . CAP (community acquired pneumonia) 05/11/2013  . Dyslipidemia 03/02/2013  . Skin lesion of scalp 01/02/2013  . GERD (gastroesophageal reflux disease) 01/02/2013  . Unspecified constipation 08/25/2012  . Infection of urinary tract 08/03/2012  . Insomnia 08/03/2012  . CAD (coronary artery disease) of artery bypass graft 08/03/2012  . Osteoarthritis of left hip 07/14/2012  . Lumbosacral spondylosis without myelopathy 07/14/2012  . Essential hypertension, benign 07/14/2012  . Urinary incontinence due to cognitive impairment 06/23/2011  . Dementia 06/23/2011  . Femur fracture, left 06/19/2011    Allergies  Allergen Reactions  . Alendronate Sodium     Per mar  . Budesonide-Formoterol Fumarate     Per mar  .  Fluticasone Propionate (Inhal)     Per mar  . Mometasone Furoate     Per mar  . Omeprazole     Per mar  . Valium [Diazepam]     Medications: Patient's Medications  New Prescriptions   No medications on file  Previous Medications   ACETAMINOPHEN (TYLENOL) 650 MG CR TABLET    Take 650 mg by mouth. Take one every 6 hours as needed for pain   ASPIRIN 81 MG CHEWABLE TABLET    Chew 81 mg by mouth daily.    ATORVASTATIN (LIPITOR) 10 MG TABLET    Take 10 mg by mouth every evening.   CALCIUM-VITAMIN D (OSCAL WITH D) 500-200 MG-UNIT PER TABLET    Take 1 tablet by mouth 2 (two) times daily.   ISOSORBIDE MONONITRATE (IMDUR) 60 MG 24 HR TABLET    Take 30 mg by mouth daily.   MEMANTINE (NAMENDA) 10 MG TABLET    Take 10 mg by mouth 2 (two) times daily.   METOPROLOL (LOPRESSOR) 50 MG TABLET    Take 25 mg by mouth daily.   MULTIPLE VITAMINS-MINERALS (CERTAVITE SENIOR/ANTIOXIDANT PO)    Take 1 tablet by mouth daily.   OMEPRAZOLE-SODIUM BICARBONATE (ZEGERID) 40-1100 MG PER CAPSULE    Take 1 capsule by mouth 2 (two) times daily.   POLYETHYLENE GLYCOL (MIRALAX / GLYCOLAX) PACKET    Take 17 g by mouth daily.   SERTRALINE (ZOLOFT) 25 MG TABLET    Take 25 mg by mouth daily.  Modified Medications   No medications on file  Discontinued Medications   No medications on file    Physical Exam: Filed Vitals:   01/07/15 1628  BP: 128/72  Pulse: 68  Temp: 98.9 F (37.2 C)  TempSrc: Tympanic  Resp: 16   There is no weight on file to calculate BMI.  Physical Exam  Constitutional: She appears well-developed and well-nourished.  HENT:  Head: Normocephalic and atraumatic.  Eyes: EOM are normal. Pupils are equal, round, and reactive to light. Right eye exhibits no discharge. Left eye exhibits no discharge.  Neck: Normal range of motion. Neck supple. No JVD present. No thyromegaly present.  Cardiovascular: Normal rate and regular rhythm.   No murmur heard. Pulmonary/Chest: No respiratory distress. She has no wheezes. She has no rales.  Abdominal: Soft. Bowel sounds are normal. She exhibits no distension. There is no tenderness.  Musculoskeletal: Normal range of motion. She exhibits no edema or tenderness.  Lymphadenopathy:    She has no cervical adenopathy.  Neurological: She is alert. She has normal reflexes. No cranial nerve deficit. She exhibits normal muscle tone. Coordination normal.  Skin: Skin is warm  and dry. No rash noted. No erythema.  Healed site of excisional biopsy of skin cancer of right forehead. 1.25 inch scar.  Psychiatric:  Increased confusion, pacing, restlessness, not sleep much at night     Labs reviewed: Basic Metabolic Panel:  Recent Labs  81/19/14 06/21/14 01/03/15  NA 141 145 144  K 4.3 3.9 4.2  BUN 17 17 22*  CREATININE 0.5 0.8 1.0    Liver Function Tests:  Recent Labs  04/07/14 06/21/14 01/03/15  AST ALT ALKPHOS 92 91 101    CBC:  Recent Labs  04/07/14 06/21/14 01/03/15  WBC 9.2 7.8 7.1  HGB 12.6 13.7 12.7  HCT 38 41 38  PLT 208 189 176    Lab Results  Component Value Date   TSH 2.01 01/03/2015  No results found for: HGBA1C No results found for: CHOL, HDL, LDLCALC, LDLDIRECT, TRIG, CHOLHDL  Significant Diagnostic Results since last visit: none  Patient Care Team: Kimber Relic, MD as PCP - General (Internal Medicine) [facility] Man Mast X, NP as Nurse Practitioner (Nurse Practitioner)  Assessment/Plan Problem List Items Addressed This Visit    Infection of urinary tract - Primary    01/06/15 urine culture enterococcus 75,000c/ml, Nitrofurantoin  bid x 7 days.       GERD (gastroesophageal reflux disease)    Stable. Continue Famotidine  daily.       Hyponatremia    01/03/15 Na 144      Dementia due to medical condition without behavioral disturbance    09/2013 MMSE 10/30 04/24/14 MMSE 8/30 Progressing, continue Namenda for now. Treat UTI and observe the patient.       Hypertension    Controlled, continue Metoprolol  daily.       Constipation    Stable. Continue MiraLax daily.       Edema    Trace in ankles. No SOB, phlegm production, or orthopnea. Continue to observe.       Depression due to dementia    Restless, pacing, increased confusion, continue Sertraline, off Mirtazapine, will tx UTI and observe the patient.           Family/ staff Communication: continue SNF  MCU for care needs.   Labs/tests ordered:  none  ManXie Mast NP Geriatrics [facility] [address], [place] [address] On Call:  [phone] & follow prompts after 5pm & weekends Office Phone:  [phone] Office Fax:  [phone]

## 2015-01-07 NOTE — Assessment & Plan Note (Signed)
Trace in ankles. No SOB, phlegm production, or orthopnea. Continue to observe.     

## 2015-01-07 NOTE — Assessment & Plan Note (Signed)
01/03/15 Na 144

## 2015-01-07 NOTE — Assessment & Plan Note (Signed)
09/2013 MMSE 10/30 04/24/14 MMSE 8/30 Progressing, continue Namenda for now. Treat UTI and observe the patient.

## 2015-01-07 NOTE — Assessment & Plan Note (Signed)
01/06/15 urine culture enterococcus 75,000c/ml, Nitrofurantoin  bid x 7 days.

## 2015-01-07 NOTE — Assessment & Plan Note (Signed)
Restless, pacing, increased confusion, continue Sertraline, off Mirtazapine, will tx UTI and observe the patient.

## 2015-02-18 ENCOUNTER — Non-Acute Institutional Stay (SKILLED_NURSING_FACILITY): Payer: Medicare Other | Admitting: Nurse Practitioner

## 2015-02-18 ENCOUNTER — Encounter: Payer: Self-pay | Admitting: Nurse Practitioner

## 2015-02-18 DIAGNOSIS — I2510 Atherosclerotic heart disease of native coronary artery without angina pectoris: Secondary | ICD-10-CM | POA: Diagnosis not present

## 2015-02-18 DIAGNOSIS — K219 Gastro-esophageal reflux disease without esophagitis: Secondary | ICD-10-CM

## 2015-02-18 DIAGNOSIS — F0280 Dementia in other diseases classified elsewhere without behavioral disturbance: Secondary | ICD-10-CM

## 2015-02-18 DIAGNOSIS — F0393 Unspecified dementia, unspecified severity, with mood disturbance: Secondary | ICD-10-CM

## 2015-02-18 DIAGNOSIS — R627 Adult failure to thrive: Secondary | ICD-10-CM | POA: Diagnosis not present

## 2015-02-18 DIAGNOSIS — I10 Essential (primary) hypertension: Secondary | ICD-10-CM | POA: Diagnosis not present

## 2015-02-18 DIAGNOSIS — F329 Major depressive disorder, single episode, unspecified: Secondary | ICD-10-CM | POA: Diagnosis not present

## 2015-02-18 DIAGNOSIS — K5900 Constipation, unspecified: Secondary | ICD-10-CM | POA: Diagnosis not present

## 2015-02-18 NOTE — Assessment & Plan Note (Signed)
09/2013 MMSE 10/30 04/24/14 MMSE 8/30 Progressing, continue Namenda for now. Worse at the onset of UTI, better after UTI fully treated.

## 2015-02-18 NOTE — Assessment & Plan Note (Signed)
Continue to decline physically, cognitively, weight loss, ADL dependency. Continue to supportive care.

## 2015-02-18 NOTE — Assessment & Plan Note (Signed)
Stable. Continue MiraLax daily.  

## 2015-02-18 NOTE — Assessment & Plan Note (Signed)
Improved restless, pacing, increased confusion after UTI fully treated,  continue Sertraline, off Mirtazapine,

## 2015-02-18 NOTE — Assessment & Plan Note (Signed)
Controlled, continue Metoprolol 25mg daily 

## 2015-02-18 NOTE — Progress Notes (Signed)
Patient ID: Joann Baker, female   DOB: 11/19/1924, 81 y.o.   MRN: [id number]  Location:  SNF [facility] Provider:  Chipper Oman NP  Code Status:  DNR Goals of care: Advanced Directive information    Chief Complaint  Patient presents with  . Medical Management of Chronic Issues     HPI: Patient is a 81 y.o. female seen in the SNF at [name] today for evaluation of hx of hypertension, dementia, FTT, GERD. Recently gradual decline physically and cognitively.   Review of Systems  Constitutional: Positive for weight loss. Negative for fever and diaphoresis.  HENT: Negative.  Negative for congestion and hearing loss.   Eyes: Negative.  Negative for pain and discharge.  Respiratory: Negative.  Negative for cough, shortness of breath and wheezing.   Cardiovascular: Negative for chest pain, palpitations and leg swelling.  Gastrointestinal: Negative.  Negative for vomiting, abdominal pain, diarrhea and constipation.  Genitourinary: Positive for urgency. Negative for dysuria and frequency.  Musculoskeletal: Positive for back pain. Negative for myalgias, joint pain and neck pain.  Skin: Negative for rash.  Neurological: Negative for tingling, speech change, focal weakness, seizures and headaches.       Dementia. 09/10/12 MMSE; total 19/30   Psychiatric/Behavioral: Positive for memory loss. Negative for depression and hallucinations. The patient is not nervous/anxious.     Past Medical History  Diagnosis Date  . Hypertension   . DJD (degenerative joint disease)   . Hyperlipidemia   . Coronary atherosclerosis of native coronary artery   . Adjustment disorder with depressed mood   . Dementia in conditions classified elsewhere without behavioral disturbance   . Right bundle branch block   . Asthma   . GERD (gastroesophageal reflux disease)   . Unspecified constipation   . Pain in joint, pelvic region and thigh     left  . Osteoporosis   . Unspecified urinary incontinence   .  Anemia   . Depression   . Edema   . Urinary tract infection, site not specified   . History of fall   . Constipation   . Insomnia     Patient Active Problem List   Diagnosis Date Noted  . Adult failure to thrive 02/18/2015  . Loss of weight 10/15/2014  . Depression due to dementia 06/20/2014  . Seborrheic dermatitis, unspecified 12/14/2013  . Edema 12/14/2013  . Neuropathy of both upper extremities (HCC) 12/14/2013  . Dementia due to medical condition without behavioral disturbance   . Hypertension   . DJD (degenerative joint disease)   . Coronary atherosclerosis of native coronary artery   . Anemia   . History of fall   . Constipation   . Hyponatremia 05/15/2013  . CAP (community acquired pneumonia) 05/11/2013  . Dyslipidemia 03/02/2013  . Skin lesion of scalp 01/02/2013  . GERD (gastroesophageal reflux disease) 01/02/2013  . Unspecified constipation 08/25/2012  . Infection of urinary tract 08/03/2012  . Insomnia 08/03/2012  . CAD (coronary artery disease) of artery bypass graft 08/03/2012  . Osteoarthritis of left hip 07/14/2012  . Lumbosacral spondylosis without myelopathy 07/14/2012  . Essential hypertension, benign 07/14/2012  . Urinary incontinence due to cognitive impairment 06/23/2011  . Dementia 06/23/2011  . Femur fracture, left (HCC) 06/19/2011    Allergies  Allergen Reactions  . Alendronate Sodium     Per mar  . Budesonide-Formoterol Fumarate     Per mar  . Fluticasone Propionate (Inhal)     Per mar  . Mometasone Furoate  Per mar  . Omeprazole     Per mar  . Valium [Diazepam]     Medications: Patient's Medications  New Prescriptions   No medications on file  Previous Medications   ACETAMINOPHEN (TYLENOL) 650 MG CR TABLET    Take 650 mg by mouth. Take one every 6 hours as needed for pain   ASPIRIN 81 MG CHEWABLE TABLET    Chew 81 mg by mouth daily.   ATORVASTATIN (LIPITOR) 10 MG TABLET    Take 10 mg by mouth every evening.    CALCIUM-VITAMIN D (OSCAL WITH D) 500-200 MG-UNIT PER TABLET    Take 1 tablet by mouth 2 (two) times daily.   ISOSORBIDE MONONITRATE (IMDUR) 60 MG 24 HR TABLET    Take 30 mg by mouth daily.   MEMANTINE (NAMENDA) 10 MG TABLET    Take 10 mg by mouth 2 (two) times daily.   METOPROLOL (LOPRESSOR) 50 MG TABLET    Take 25 mg by mouth daily.   MULTIPLE VITAMINS-MINERALS (CERTAVITE SENIOR/ANTIOXIDANT PO)    Take 1 tablet by mouth daily.   OMEPRAZOLE-SODIUM BICARBONATE (ZEGERID) 40-1100 MG PER CAPSULE    Take 1 capsule by mouth 2 (two) times daily.   POLYETHYLENE GLYCOL (MIRALAX / GLYCOLAX) PACKET    Take 17 g by mouth daily.   SERTRALINE (ZOLOFT) 25 MG TABLET    Take 25 mg by mouth daily.  Modified Medications   No medications on file  Discontinued Medications   No medications on file    Physical Exam: Filed Vitals:   02/18/15 1530  BP: 118/62  Pulse: 54  Temp: 98.6 F (37 C)  TempSrc: Tympanic  Resp: 18   There is no weight on file to calculate BMI.  Physical Exam  Constitutional: She appears well-developed and well-nourished.  HENT:  Head: Normocephalic and atraumatic.  Eyes: EOM are normal. Pupils are equal, round, and reactive to light. Right eye exhibits no discharge. Left eye exhibits no discharge.  Neck: Normal range of motion. Neck supple. No JVD present. No thyromegaly present.  Cardiovascular: Normal rate and regular rhythm.   No murmur heard. Pulmonary/Chest: No respiratory distress. She has no wheezes. She has no rales.  Abdominal: Soft. Bowel sounds are normal. She exhibits no distension. There is no tenderness.  Musculoskeletal: Normal range of motion. She exhibits no edema.  Lymphadenopathy:    She has no cervical adenopathy.  Neurological: She is alert. She has normal reflexes. No cranial nerve deficit. She exhibits normal muscle tone. Coordination normal.  Skin: Skin is warm and dry. No rash noted. No erythema.  Healed site of excisional biopsy of skin cancer of right  forehead. 1.25 inch scar.  Psychiatric:  persisted confusion, pacing, restlessness, not sleep much at night     Labs reviewed: Basic Metabolic Panel:  Recent Labs  60/45/4012/19/15 06/21/14 01/03/15  NA 141 145 144  K 4.3 3.9 4.2  BUN 17 17 22*  CREATININE 0.5 0.8 1.0    Liver Function Tests:  Recent Labs  04/07/14 06/21/14 01/03/15  AST 20 21 24   ALT 16 11 17   ALKPHOS 92 91 101    CBC:  Recent Labs  04/07/14 06/21/14 01/03/15  WBC 9.2 7.8 7.1  HGB 12.6 13.7 12.7  HCT 38 41 38  PLT 208 189 176    Lab Results  Component Value Date   TSH 2.01 01/03/2015   No results found for: HGBA1C No results found for: CHOL, HDL, LDLCALC, LDLDIRECT, TRIG, CHOLHDL  Significant Diagnostic  Results since last visit: none  Patient Care Team: Kimber Relic, MD as PCP - General (Internal Medicine) [facility] Dymon Summerhill X, NP as Nurse Practitioner (Nurse Practitioner)  Assessment/Plan Problem List Items Addressed This Visit    Adult failure to thrive    Continue to decline physically, cognitively, weight loss, ADL dependency. Continue to supportive care.       Constipation - Primary    Stable. Continue MiraLax daily.       Coronary atherosclerosis of native coronary artery    No c/o angina since last visited. Continue Isosorbide  daily, Atorvastatin , ASA       Dementia due to medical condition without behavioral disturbance    09/2013 MMSE 10/30 04/24/14 MMSE 8/30 Progressing, continue Namenda for now. Worse at the onset of UTI, better after UTI fully treated.       Depression due to dementia    Improved restless, pacing, increased confusion after UTI fully treated,  continue Sertraline, off Mirtazapine,       Essential hypertension, benign (Chronic)    Controlled, continue Metoprolol  daily.       GERD (gastroesophageal reflux disease)    Stable. Continue Famotidine  daily.           Family/ staff Communication: continue SNF MCU for  care needs.   Labs/tests ordered:  none  ManXie Mckynlie Vanderslice NP Geriatrics [facility] [address], [place] [address] On Call:  [phone] & follow prompts after 5pm & weekends Office Phone:  [phone] Office Fax:  [phone]

## 2015-02-18 NOTE — Assessment & Plan Note (Signed)
Stable. Continue Famotidine 20mg  daily.

## 2015-02-18 NOTE — Assessment & Plan Note (Signed)
No c/o angina since last visited. Continue Isosorbide 30mg  daily, Atorvastatin 10mg , ASA 81mg 

## 2015-03-11 ENCOUNTER — Encounter: Payer: Self-pay | Admitting: Internal Medicine

## 2015-03-11 ENCOUNTER — Non-Acute Institutional Stay (SKILLED_NURSING_FACILITY): Payer: Medicare Other | Admitting: Internal Medicine

## 2015-03-11 DIAGNOSIS — F0393 Unspecified dementia, unspecified severity, with mood disturbance: Secondary | ICD-10-CM

## 2015-03-11 DIAGNOSIS — F0280 Dementia in other diseases classified elsewhere without behavioral disturbance: Secondary | ICD-10-CM

## 2015-03-11 DIAGNOSIS — F329 Major depressive disorder, single episode, unspecified: Secondary | ICD-10-CM

## 2015-03-11 DIAGNOSIS — F0390 Unspecified dementia without behavioral disturbance: Secondary | ICD-10-CM | POA: Diagnosis not present

## 2015-03-11 DIAGNOSIS — R627 Adult failure to thrive: Secondary | ICD-10-CM

## 2015-03-11 NOTE — Progress Notes (Signed)
Patient ID: Joann Baker, female   DOB: Jul 21, 1924, 81 y.o.   MRN: [id number]    [place] Room Number: 100  Place of Service: SNF (31)     Allergies  Allergen Reactions  . Alendronate Sodium     Per mar  . Budesonide-Formoterol Fumarate     Per mar  . Fluticasone Propionate (Inhal)     Per mar  . Mometasone Furoate     Per mar  . Omeprazole     Per mar  . Valium [Diazepam]     Chief Complaint  Patient presents with  . Medical Management of Chronic Issues    HPI:  Dementia, without behavioral disturbance - continues with failing health. Memory quite poor. Continues with weight loss.  Adult failure to thrive - general deterioration in health with failing memory and persistent problems of weight loss.  Depression due to dementia - cheerful. Responds quickly and with a smile.    Medications: Patient's Medications  New Prescriptions   No medications on file  Previous Medications   ACETAMINOPHEN (TYLENOL) 650 MG CR TABLET    Take 650 mg by mouth. Take one every 6 hours as needed for pain   ASPIRIN 81 MG CHEWABLE TABLET    Chew 81 mg by mouth daily.   ATORVASTATIN (LIPITOR) 10 MG TABLET    Take 10 mg by mouth every evening.   CALCIUM-VITAMIN D (OSCAL WITH D) 500-200 MG-UNIT PER TABLET    Take 1 tablet by mouth 2 (two) times daily.   ISOSORBIDE MONONITRATE (IMDUR) 60 MG 24 HR TABLET    Take 30 mg by mouth daily.   MEMANTINE (NAMENDA) 10 MG TABLET    Take 10 mg by mouth 2 (two) times daily.   METOPROLOL (LOPRESSOR) 50 MG TABLET    Take 25 mg by mouth daily.   MULTIPLE VITAMINS-MINERALS (CERTAVITE SENIOR/ANTIOXIDANT PO)    Take 1 tablet by mouth daily.   OMEPRAZOLE-SODIUM BICARBONATE (ZEGERID) 40-1100 MG PER CAPSULE    Take 1 capsule by mouth 2 (two) times daily.   POLYETHYLENE GLYCOL (MIRALAX / GLYCOLAX) PACKET    Take 17 g by mouth daily.   SERTRALINE (ZOLOFT) 25 MG TABLET    Take 25 mg by mouth daily.  Modified Medications   No  medications on file  Discontinued Medications   No medications on file     Review of Systems  Constitutional: Positive for activity change, appetite change and unexpected weight change. Negative for fever, chills, diaphoresis and fatigue.  HENT: Negative.  Negative for congestion and hearing loss.   Eyes: Negative.  Negative for pain and discharge.  Respiratory: Negative.  Negative for cough, shortness of breath and wheezing.   Cardiovascular: Negative for chest pain, palpitations and leg swelling.  Gastrointestinal: Negative.  Negative for vomiting, abdominal pain, diarrhea and constipation.  Endocrine: Negative.   Genitourinary: Positive for urgency. Negative for dysuria, frequency, flank pain and pelvic pain.  Musculoskeletal: Positive for back pain, arthralgias and gait problem. Negative for myalgias and neck pain.  Skin: Negative for pallor and rash. Wound: removal of skin cancer of the right forehead 02/15/13.  Allergic/Immunologic: Negative.   Neurological: Positive for speech difficulty (Difficulty with wored finding.). Negative for seizures and headaches.       Dementia. 09/10/12 MMSE; total 19/30   Hematological: Negative.   Psychiatric/Behavioral: Positive for confusion. Negative for hallucinations. The patient is not nervous/anxious.     Filed Vitals:   03/11/15 1508  BP:  119/60  Pulse: 66  Resp: 16  Height: 5' 4"  (1.626 m)  Weight: 119 lb (53.978 kg)   Body mass index is 20.42 kg/(m^2).  Physical Exam  Constitutional: She appears well-developed and well-nourished.  HENT:  Head: Normocephalic and atraumatic.  Eyes: EOM are normal. Pupils are equal, round, and reactive to light. Right eye exhibits no discharge. Left eye exhibits no discharge.  Neck: Normal range of motion. Neck supple. No JVD present. No thyromegaly present.  Cardiovascular: Normal rate and regular rhythm.   No murmur heard. Pulmonary/Chest: No respiratory distress. She has no wheezes. She has no  rales.  Abdominal: Soft. Bowel sounds are normal. She exhibits no distension. There is no tenderness.  Musculoskeletal: Normal range of motion. She exhibits no edema or tenderness.  Lymphadenopathy:    She has no cervical adenopathy.  Neurological: She is alert. She has normal reflexes. No cranial nerve deficit. She exhibits normal muscle tone. Coordination normal.  05/01/2014 MMSE 8/30  Skin: Skin is warm and dry. No rash noted. No erythema.  Healed site of excisional biopsy of skin cancer of right forehead. 1.25 inch scar.  Psychiatric:  Severe dementia     Labs reviewed: Lab Summary Latest Ref Rng 01/03/2015 06/21/2014 04/07/2014  Hemoglobin 12.0 - 16.0 g/dL 12.7 13.7 12.6  Hematocrit 36 - 46 % 38 41 38  White count - 7.1 7.8 9.2  Platelet count 150 - 399 K/L 176 189 208  Sodium 137 - 147 mmol/L 144 145 141  Potassium 3.4 - 5.3 mmol/L 4.2 3.9 4.3  Calcium - (None) (None) (None)  Phosphorus - (None) (None) (None)  Creatinine 0.5 - 1.1 mg/dL 1.0 0.8 0.5  AST 13 - 35 U/L 24 21 20   Alk Phos 25 - 125 U/L 101 91 92  Bilirubin - (None) (None) (None)  Glucose - 107 87 116  Cholesterol - (None) (None) (None)  HDL cholesterol - (None) (None) (None)  Triglycerides - (None) (None) (None)  LDL Direct - (None) (None) (None)  LDL Calc - (None) (None) (None)  Total protein - (None) (None) (None)  Albumin - (None) (None) (None)   Lab Results  Component Value Date   TSH 2.01 01/03/2015   Lab Results  Component Value Date   BUN 22* 01/03/2015   No results found for: HGBA1C     Assessment/Plan  1. Dementia, without behavioral disturbance Continue to observe. Patient currently residing in [facility]  2. Adult failure to thrive Continue to encourage and monitor eating  3. Depression due to dementia Remains on sertraline.

## 2015-03-18 ENCOUNTER — Encounter: Payer: Self-pay | Admitting: Nurse Practitioner

## 2015-03-18 ENCOUNTER — Non-Acute Institutional Stay (SKILLED_NURSING_FACILITY): Payer: Medicare Other | Admitting: Nurse Practitioner

## 2015-03-18 DIAGNOSIS — F0393 Unspecified dementia, unspecified severity, with mood disturbance: Secondary | ICD-10-CM

## 2015-03-18 DIAGNOSIS — K219 Gastro-esophageal reflux disease without esophagitis: Secondary | ICD-10-CM | POA: Diagnosis not present

## 2015-03-18 DIAGNOSIS — I10 Essential (primary) hypertension: Secondary | ICD-10-CM

## 2015-03-18 DIAGNOSIS — F0280 Dementia in other diseases classified elsewhere without behavioral disturbance: Secondary | ICD-10-CM | POA: Diagnosis not present

## 2015-03-18 DIAGNOSIS — K5900 Constipation, unspecified: Secondary | ICD-10-CM | POA: Diagnosis not present

## 2015-03-18 DIAGNOSIS — F329 Major depressive disorder, single episode, unspecified: Secondary | ICD-10-CM

## 2015-03-18 DIAGNOSIS — I2510 Atherosclerotic heart disease of native coronary artery without angina pectoris: Secondary | ICD-10-CM

## 2015-03-18 NOTE — Assessment & Plan Note (Signed)
No c/o angina since last visited. Continue Isosorbide 30mg daily, Atorvastatin 10mg, ASA 81mg 

## 2015-03-18 NOTE — Assessment & Plan Note (Signed)
Controlled, continue Metoprolol 25mg daily 

## 2015-03-18 NOTE — Assessment & Plan Note (Signed)
Stable. Continue MiraLax daily.  

## 2015-03-18 NOTE — Progress Notes (Signed)
Patient ID: Joann Baker, female   DOB: March 28, 1925, 81 y.o.   MRN: [id number]  Location:  SNF [facility] Provider:  Chipper Oman NP  Code Status:  DNR Goals of care: Advanced Directive information    Chief Complaint  Patient presents with  . Medical Management of Chronic Issues     HPI: Patient is a 81 y.o. female seen in the SNF at [facility] today for evaluation of hx of hypertension, dementia, FTT, GERD. Recently gradual decline physically and cognitively.   Review of Systems  Constitutional: Positive for weight loss. Negative for fever and diaphoresis.  HENT: Negative.  Negative for congestion and hearing loss.   Eyes: Negative.  Negative for pain and discharge.  Respiratory: Negative.  Negative for cough, shortness of breath and wheezing.   Cardiovascular: Negative for chest pain, palpitations and leg swelling.  Gastrointestinal: Negative.  Negative for vomiting, abdominal pain, diarrhea and constipation.  Genitourinary: Positive for urgency. Negative for dysuria and frequency.  Musculoskeletal: Positive for back pain. Negative for myalgias, joint pain and neck pain.  Skin: Negative for rash.  Neurological: Negative for tingling, speech change, focal weakness, seizures and headaches.       Dementia. 09/10/12 MMSE; total 19/30   Psychiatric/Behavioral: Positive for memory loss. Negative for depression and hallucinations. The patient is not nervous/anxious.     Past Medical History  Diagnosis Date  . Hypertension   . DJD (degenerative joint disease)   . Hyperlipidemia   . Coronary atherosclerosis of native coronary artery   . Adjustment disorder with depressed mood   . Dementia in conditions classified elsewhere without behavioral disturbance   . Right bundle branch block   . Asthma   . GERD (gastroesophageal reflux disease)   . Unspecified constipation   . Pain in joint, pelvic region and thigh     left  . Osteoporosis   . Unspecified urinary incontinence   .  Anemia   . Depression   . Edema   . Urinary tract infection, site not specified   . History of fall   . Constipation   . Insomnia     Patient Active Problem List   Diagnosis Date Noted  . Adult failure to thrive 02/18/2015  . Loss of weight 10/15/2014  . Depression due to dementia 06/20/2014  . Seborrheic dermatitis, unspecified 12/14/2013  . Neuropathy of both upper extremities (HCC) 12/14/2013  . Dementia due to medical condition without behavioral disturbance   . DJD (degenerative joint disease)   . Coronary atherosclerosis of native coronary artery   . History of fall   . Constipation   . Dyslipidemia 03/02/2013  . Skin lesion of scalp 01/02/2013  . GERD (gastroesophageal reflux disease) 01/02/2013  . Unspecified constipation 08/25/2012  . Infection of urinary tract 08/03/2012  . Insomnia 08/03/2012  . CAD (coronary artery disease) of artery bypass graft 08/03/2012  . Osteoarthritis of left hip 07/14/2012  . Lumbosacral spondylosis without myelopathy 07/14/2012  . Essential hypertension, benign 07/14/2012  . Urinary incontinence due to cognitive impairment 06/23/2011  . Dementia 06/23/2011  . Femur fracture, left (HCC) 06/19/2011    Allergies  Allergen Reactions  . Alendronate Sodium     Per mar  . Budesonide-Formoterol Fumarate     Per mar  . Fluticasone Propionate (Inhal)     Per mar  . Mometasone Furoate     Per mar  . Omeprazole     Per mar  . Valium [Diazepam]     Medications: Patient's  Medications  New Prescriptions   No medications on file  Previous Medications   ACETAMINOPHEN (TYLENOL) 650 MG CR TABLET    Take 650 mg by mouth. Take one every 6 hours as needed for pain   ASPIRIN 81 MG CHEWABLE TABLET    Chew 81 mg by mouth daily.   ATORVASTATIN (LIPITOR) 10 MG TABLET    Take 10 mg by mouth every evening.   CALCIUM-VITAMIN D (OSCAL WITH D) 500-200 MG-UNIT PER TABLET    Take 1 tablet by mouth 2 (two) times daily.   ISOSORBIDE MONONITRATE (IMDUR) 60  MG 24 HR TABLET    Take 30 mg by mouth daily.   MEMANTINE (NAMENDA) 10 MG TABLET    Take 10 mg by mouth 2 (two) times daily.   METOPROLOL (LOPRESSOR) 50 MG TABLET    Take 25 mg by mouth daily.   MULTIPLE VITAMINS-MINERALS (CERTAVITE SENIOR/ANTIOXIDANT PO)    Take 1 tablet by mouth daily.   OMEPRAZOLE-SODIUM BICARBONATE (ZEGERID) 40-1100 MG PER CAPSULE    Take 1 capsule by mouth 2 (two) times daily.   POLYETHYLENE GLYCOL (MIRALAX / GLYCOLAX) PACKET    Take 17 g by mouth daily.   SERTRALINE (ZOLOFT) 25 MG TABLET    Take 25 mg by mouth daily.  Modified Medications   No medications on file  Discontinued Medications   No medications on file    Physical Exam: Filed Vitals:   03/18/15 1516  BP: 119/64  Pulse: 66  Temp: 97.7 F (36.5 C)  TempSrc: Tympanic  Resp: 16   There is no weight on file to calculate BMI.  Physical Exam  Constitutional: She appears well-developed and well-nourished.  HENT:  Head: Normocephalic and atraumatic.  Eyes: EOM are normal. Pupils are equal, round, and reactive to light. Right eye exhibits no discharge. Left eye exhibits no discharge.  Neck: Normal range of motion. Neck supple. No JVD present. No thyromegaly present.  Cardiovascular: Normal rate and regular rhythm.   No murmur heard. Pulmonary/Chest: No respiratory distress. She has no wheezes. She has no rales.  Abdominal: Soft. Bowel sounds are normal. She exhibits no distension. There is no tenderness.  Musculoskeletal: Normal range of motion. She exhibits no edema.  Lymphadenopathy:    She has no cervical adenopathy.  Neurological: She is alert. She has normal reflexes. No cranial nerve deficit. She exhibits normal muscle tone. Coordination normal.  Skin: Skin is warm and dry. No rash noted. No erythema.  Healed site of excisional biopsy of skin cancer of right forehead. 1.25 inch scar.  Psychiatric:  persisted confusion, pacing, restlessness, not sleep much at night     Labs reviewed: Basic  Metabolic Panel:  Recent Labs  16/10/96 06/21/14 01/03/15  NA 141 145 144  K 4.3 3.9 4.2  BUN 17 17 22*  CREATININE 0.5 0.8 1.0    Liver Function Tests:  Recent Labs  04/07/14 06/21/14 01/03/15  AST ALT ALKPHOS 92 91 101    CBC:  Recent Labs  04/07/14 06/21/14 01/03/15  WBC 9.2 7.8 7.1  HGB 12.6 13.7 12.7  HCT 38 41 38  PLT 208 189 176    Lab Results  Component Value Date   TSH 2.01 01/03/2015   No results found for: HGBA1C No results found for: CHOL, HDL, LDLCALC, LDLDIRECT, TRIG, CHOLHDL  Significant Diagnostic Results since last visit: none  Patient Care Team: Kimber Relic, MD as PCP - General (Internal Medicine) [facility]  [place] Malachi Kinzler X, NP as Nurse Practitioner (Nurse Practitioner)  Assessment/Plan Problem List Items Addressed This Visit    Essential hypertension, benign - Primary (Chronic)    Controlled, continue Metoprolol 25mg  daily.       Depression due to dementia (Chronic)    Improved restless, pacing, increased confusion after UTI fully treated,  continue Sertraline, off Mirtazapine      GERD (gastroesophageal reflux disease)    Stable. Continue Famotidine 20mg  daily.      Dementia due to medical condition without behavioral disturbance    09/2013 MMSE 10/30 04/24/14 MMSE 8/30 Progressing, continue Namenda for now      Coronary atherosclerosis of native coronary artery    No c/o angina since last visited. Continue Isosorbide 30mg  daily, Atorvastatin 10mg , ASA 81mg       Constipation    Stable. Continue MiraLax daily.          Family/ staff Communication: continue SNF MCU for care needs.   Labs/tests ordered:  none  ManXie Jamarr Treinen NP Geriatrics [facility] [address] [place], [place] [address] On Call:  [phone] & follow prompts after 5pm & weekends Office Phone:  [phone] Office Fax:  [phone]

## 2015-03-18 NOTE — Assessment & Plan Note (Signed)
Stable. Continue Famotidine 20mg daily.  

## 2015-03-18 NOTE — Assessment & Plan Note (Signed)
Improved restless, pacing, increased confusion after UTI fully treated,  continue Sertraline, off Mirtazapine 

## 2015-03-18 NOTE — Assessment & Plan Note (Signed)
09/2013 MMSE 10/30 04/24/14 MMSE 8/30 Progressing, continue Namenda for now 

## 2015-04-17 ENCOUNTER — Non-Acute Institutional Stay (SKILLED_NURSING_FACILITY): Payer: Medicare Other | Admitting: Nurse Practitioner

## 2015-04-17 ENCOUNTER — Encounter: Payer: Self-pay | Admitting: Nurse Practitioner

## 2015-04-17 DIAGNOSIS — F0280 Dementia in other diseases classified elsewhere without behavioral disturbance: Secondary | ICD-10-CM | POA: Diagnosis not present

## 2015-04-17 DIAGNOSIS — F329 Major depressive disorder, single episode, unspecified: Secondary | ICD-10-CM

## 2015-04-17 DIAGNOSIS — I2510 Atherosclerotic heart disease of native coronary artery without angina pectoris: Secondary | ICD-10-CM | POA: Diagnosis not present

## 2015-04-17 DIAGNOSIS — K219 Gastro-esophageal reflux disease without esophagitis: Secondary | ICD-10-CM

## 2015-04-17 DIAGNOSIS — F0393 Unspecified dementia, unspecified severity, with mood disturbance: Secondary | ICD-10-CM

## 2015-04-17 DIAGNOSIS — I10 Essential (primary) hypertension: Secondary | ICD-10-CM

## 2015-04-17 DIAGNOSIS — R627 Adult failure to thrive: Secondary | ICD-10-CM

## 2015-04-17 DIAGNOSIS — K5900 Constipation, unspecified: Secondary | ICD-10-CM

## 2015-04-17 NOTE — Assessment & Plan Note (Signed)
No c/o angina since last visited. Continue Isosorbide 30mg daily, Atorvastatin 10mg, ASA 81mg 

## 2015-04-17 NOTE — Progress Notes (Signed)
Patient ID: Joann Baker, female   DOB: 05/12/1924, 81 y.o.   MRN: [id number]  Location:  SNF [facility] Provider:  Chipper OmanManXie Breandan People NP  Code Status:  DNR Goals of care: Advanced Directive information    Chief Complaint  Patient presents with  . Medical Management of Chronic Issues     HPI: Patient is a 81 y.o. female [facility] today for evaluation of dementia, depression, HTN, constipation, GERD, FTT  Review of Systems  Constitutional: Positive for weight loss. Negative for fever and diaphoresis.  HENT: Negative.  Negative for congestion and hearing loss.   Eyes: Negative.  Negative for pain and discharge.  Respiratory: Negative.  Negative for cough, shortness of breath and wheezing.   Cardiovascular: Negative for chest pain, palpitations and leg swelling.  Gastrointestinal: Negative.  Negative for vomiting, abdominal pain, diarrhea and constipation.  Genitourinary: Positive for urgency. Negative for dysuria and frequency.  Musculoskeletal: Positive for back pain. Negative for myalgias, joint pain and neck pain.  Skin: Negative for rash.  Neurological: Negative for tingling, speech change, focal weakness, seizures and headaches.       Dementia. 09/10/12 MMSE; total 19/30   Psychiatric/Behavioral: Positive for memory loss. Negative for depression and hallucinations. The patient is not nervous/anxious.     Past Medical History  Diagnosis Date  . Hypertension   . DJD (degenerative joint disease)   . Hyperlipidemia   . Coronary atherosclerosis of native coronary artery   . Adjustment disorder with depressed mood   . Dementia in conditions classified elsewhere without behavioral disturbance   . Right bundle branch block   . Asthma   . GERD (gastroesophageal reflux disease)   . Unspecified constipation   . Pain in joint, pelvic region and thigh     left  . Osteoporosis   . Unspecified urinary incontinence   . Anemia   . Depression   . Edema   . Urinary  tract infection, site not specified   . History of fall   . Constipation   . Insomnia     Patient Active Problem List   Diagnosis Date Noted  . Adult failure to thrive 02/18/2015  . Loss of weight 10/15/2014  . Depression due to dementia 06/20/2014  . Seborrheic dermatitis, unspecified 12/14/2013  . Neuropathy of both upper extremities (HCC) 12/14/2013  . Dementia due to medical condition without behavioral disturbance   . DJD (degenerative joint disease)   . Coronary atherosclerosis of native coronary artery   . History of fall   . Constipation   . Dyslipidemia 03/02/2013  . Skin lesion of scalp 01/02/2013  . GERD (gastroesophageal reflux disease) 01/02/2013  . Infection of urinary tract 08/03/2012  . Insomnia 08/03/2012  . CAD (coronary artery disease) of artery bypass graft 08/03/2012  . Osteoarthritis of left hip 07/14/2012  . Lumbosacral spondylosis without myelopathy 07/14/2012  . Essential hypertension, benign 07/14/2012  . Urinary incontinence due to cognitive impairment 06/23/2011  . Dementia 06/23/2011  . Femur fracture, left (HCC) 06/19/2011    Allergies  Allergen Reactions  . Alendronate Sodium     Per mar  . Budesonide-Formoterol Fumarate     Per mar  . Fluticasone Propionate (Inhal)     Per mar  . Mometasone Furoate     Per mar  . Omeprazole     Per mar  . Valium [Diazepam]     Medications: Patient's Medications  New Prescriptions   No medications on file  Previous  Medications   ACETAMINOPHEN (TYLENOL) 650 MG CR TABLET    Take 650 mg by mouth. Take one every 6 hours as needed for pain   ASPIRIN 81 MG CHEWABLE TABLET    Chew 81 mg by mouth daily.   ATORVASTATIN (LIPITOR) 10 MG TABLET    Take 10 mg by mouth every evening.   CALCIUM-VITAMIN D (OSCAL WITH D) 500-200 MG-UNIT PER TABLET    Take 1 tablet by mouth 2 (two) times daily.   ISOSORBIDE MONONITRATE (IMDUR) 60 MG 24 HR TABLET    Take 30 mg by mouth daily.   MEMANTINE (NAMENDA) 10 MG TABLET     Take 10 mg by mouth 2 (two) times daily.   METOPROLOL (LOPRESSOR) 50 MG TABLET    Take 25 mg by mouth daily.   MULTIPLE VITAMINS-MINERALS (CERTAVITE SENIOR/ANTIOXIDANT PO)    Take 1 tablet by mouth daily.   OMEPRAZOLE-SODIUM BICARBONATE (ZEGERID) 40-1100 MG PER CAPSULE    Take 1 capsule by mouth 2 (two) times daily.   POLYETHYLENE GLYCOL (MIRALAX / GLYCOLAX) PACKET    Take 17 g by mouth daily.   SERTRALINE (ZOLOFT) 25 MG TABLET    Take 25 mg by mouth daily.  Modified Medications   No medications on file  Discontinued Medications   No medications on file    Physical Exam: Filed Vitals:   04/17/15 1240  BP: 110/62  Pulse: 64  Temp: 98.6 F (37 C)  TempSrc: Tympanic  Resp: 16   There is no weight on file to calculate BMI.  Physical Exam  Constitutional: She appears well-developed and well-nourished.  HENT:  Head: Normocephalic and atraumatic.  Eyes: EOM are normal. Pupils are equal, round, and reactive to light. Right eye exhibits no discharge. Left eye exhibits no discharge.  Neck: Normal range of motion. Neck supple. No JVD present. No thyromegaly present.  Cardiovascular: Normal rate and regular rhythm.   No murmur heard. Pulmonary/Chest: No respiratory distress. She has no wheezes. She has no rales.  Abdominal: Soft. Bowel sounds are normal. She exhibits no distension. There is no tenderness.  Musculoskeletal: Normal range of motion. She exhibits no edema.  Lymphadenopathy:    She has no cervical adenopathy.  Neurological: She is alert. She has normal reflexes. No cranial nerve deficit. She exhibits normal muscle tone. Coordination normal.  Skin: Skin is warm and dry. No rash noted. No erythema.  Healed site of excisional biopsy of skin cancer of right forehead. 1.25 inch scar.  Psychiatric:  persisted confusion, pacing, restlessness, not sleep much at night     Labs reviewed: Basic Metabolic Panel:  Recent Labs  16/10/96 01/03/15  NA 145 144  K 3.9 4.2  BUN 17 22*   CREATININE 0.8 1.0    Liver Function Tests:  Recent Labs  06/21/14 01/03/15  AST 21 24  ALT 11 17  ALKPHOS 91 101    CBC:  Recent Labs  06/21/14 01/03/15  WBC 7.8 7.1  HGB 13.7 12.7  HCT 41 38  PLT 189 176    Lab Results  Component Value Date   TSH 2.01 01/03/2015   No results found for: HGBA1C No results found for: CHOL, HDL, LDLCALC, LDLDIRECT, TRIG, CHOLHDL  Significant Diagnostic Results since last visit: none  Patient Care Team: Kimber Relic, MD as PCP - General (Internal Medicine) [facility] Lelynd Poer X, NP as Nurse Practitioner (Nurse Practitioner)  Assessment/Plan Problem List Items Addressed This Visit    GERD (gastroesophageal reflux disease)  Stable. Continue Famotidine  daily.      Essential hypertension, benign (Chronic)    Controlled, continue Metoprolol  daily.       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 Continue Isosorbide  daily, Atorvastatin , ASA       Constipation - Primary    Stable. Continue MiraLax daily.      Adult failure to thrive (Chronic)    Continue to decline physically, cognitively, weight loss, ADL dependency. Continue to supportive care.           Family/ staff Communication: SNF MCU for care needs.   Labs/tests ordered: none  ManXie Mckyla Deckman NP Geriatrics [facility] [address] [place], [place] [address] On Call:  [phone] & follow prompts after 5pm & weekends Office Phone:  [phone] Office Fax:  [phone]

## 2015-04-17 NOTE — Assessment & Plan Note (Signed)
Improved restless, pacing, increased confusion after UTI fully treated,  continue Sertraline, off Mirtazapine

## 2015-04-17 NOTE — Assessment & Plan Note (Signed)
Stable. Continue Famotidine 20mg daily.  

## 2015-04-17 NOTE — Assessment & Plan Note (Signed)
Stable. Continue MiraLax daily.  

## 2015-04-17 NOTE — Assessment & Plan Note (Signed)
09/2013 MMSE 10/30 04/24/14 MMSE 8/30 Progressing, continue Namenda for now

## 2015-04-17 NOTE — Assessment & Plan Note (Signed)
Continue to decline physically, cognitively, weight loss, ADL dependency. Continue to supportive care.  

## 2015-04-17 NOTE — Assessment & Plan Note (Signed)
Controlled, continue Metoprolol 25mg daily 

## 2015-05-13 ENCOUNTER — Non-Acute Institutional Stay (SKILLED_NURSING_FACILITY): Payer: Medicare Other | Admitting: Nurse Practitioner

## 2015-05-13 DIAGNOSIS — F0280 Dementia in other diseases classified elsewhere without behavioral disturbance: Secondary | ICD-10-CM

## 2015-05-13 DIAGNOSIS — R627 Adult failure to thrive: Secondary | ICD-10-CM | POA: Diagnosis not present

## 2015-05-13 DIAGNOSIS — F0393 Unspecified dementia, unspecified severity, with mood disturbance: Secondary | ICD-10-CM

## 2015-05-13 DIAGNOSIS — K5900 Constipation, unspecified: Secondary | ICD-10-CM

## 2015-05-13 DIAGNOSIS — I10 Essential (primary) hypertension: Secondary | ICD-10-CM | POA: Diagnosis not present

## 2015-05-13 DIAGNOSIS — I2510 Atherosclerotic heart disease of native coronary artery without angina pectoris: Secondary | ICD-10-CM

## 2015-05-13 DIAGNOSIS — K219 Gastro-esophageal reflux disease without esophagitis: Secondary | ICD-10-CM

## 2015-05-13 DIAGNOSIS — F329 Major depressive disorder, single episode, unspecified: Secondary | ICD-10-CM | POA: Diagnosis not present

## 2015-05-13 NOTE — Progress Notes (Signed)
Patient ID: Joann Baker, female   DOB: 07-Jul-1924, 82 y.o.   MRN: [id number]  Location:  SNF [facility] Provider:  Chipper Oman NP  Code Status:  DNR Goals of care: Advanced Directive information    Chief Complaint  Patient presents with  . Medical Management of Chronic Issues  . Acute Visit    fatigue, malaise     HPI: Patient is a 82 y.o. female seen in the SNF at [facility] today for evaluation of malaise, fatigue, increased confusion, Hx of dementia, depression, HTN, constipation, GERD, FTT  Review of Systems  Constitutional: Positive for weight loss. Negative for fever and diaphoresis.  HENT: Negative.  Negative for congestion and hearing loss.   Eyes: Negative.  Negative for pain and discharge.  Respiratory: Negative.  Negative for cough, shortness of breath and wheezing.   Cardiovascular: Negative for chest pain, palpitations and leg swelling.  Gastrointestinal: Negative.  Negative for vomiting, abdominal pain, diarrhea and constipation.  Genitourinary: Positive for urgency. Negative for dysuria and frequency.  Musculoskeletal: Positive for back pain. Negative for myalgias, joint pain and neck pain.  Skin: Negative for rash.  Neurological: Negative for tingling, speech change, focal weakness, seizures and headaches.       Dementia. 09/10/12 MMSE; total 19/30   Psychiatric/Behavioral: Positive for memory loss. Negative for depression and hallucinations. The patient is not nervous/anxious.     Past Medical History  Diagnosis Date  . Hypertension   . DJD (degenerative joint disease)   . Hyperlipidemia   . Coronary atherosclerosis of native coronary artery   . Adjustment disorder with depressed mood   . Dementia in conditions classified elsewhere without behavioral disturbance   . Right bundle branch block   . Asthma   . GERD (gastroesophageal reflux disease)   . Unspecified constipation   . Pain in joint, pelvic region and thigh     left  . Osteoporosis   .  Unspecified urinary incontinence   . Anemia   . Depression   . Edema   . Urinary tract infection, site not specified   . History of fall   . Constipation   . Insomnia     Patient Active Problem List   Diagnosis Date Noted  . Adult failure to thrive 02/18/2015  . Loss of weight 10/15/2014  . Depression due to dementia 06/20/2014  . Seborrheic dermatitis, unspecified 12/14/2013  . Neuropathy of both upper extremities (HCC) 12/14/2013  . Dementia due to medical condition without behavioral disturbance   . DJD (degenerative joint disease)   . Coronary atherosclerosis of native coronary artery   . History of fall   . Constipation   . Dyslipidemia 03/02/2013  . Skin lesion of scalp 01/02/2013  . GERD (gastroesophageal reflux disease) 01/02/2013  . Infection of urinary tract 08/03/2012  . Insomnia 08/03/2012  . CAD (coronary artery disease) of artery bypass graft 08/03/2012  . Osteoarthritis of left hip 07/14/2012  . Lumbosacral spondylosis without myelopathy 07/14/2012  . Essential hypertension, benign 07/14/2012  . Urinary incontinence due to cognitive impairment 06/23/2011  . Dementia 06/23/2011  . Femur fracture, left (HCC) 06/19/2011    Allergies  Allergen Reactions  . Alendronate Sodium     Per mar  . Budesonide-Formoterol Fumarate     Per mar  . Fluticasone Propionate (Inhal)     Per mar  . Mometasone Furoate     Per mar  . Omeprazole     Per mar  . Valium [Diazepam]  Medications: Patient's Medications  New Prescriptions   No medications on file  Previous Medications   ACETAMINOPHEN (TYLENOL) 650 MG CR TABLET    Take 650 mg by mouth. Take one every 6 hours as needed for pain   ASPIRIN 81 MG CHEWABLE TABLET    Chew 81 mg by mouth daily.   ATORVASTATIN (LIPITOR) 10 MG TABLET    Take 10 mg by mouth every evening.   CALCIUM-VITAMIN D (OSCAL WITH D) 500-200 MG-UNIT PER TABLET    Take 1 tablet by mouth 2 (two) times daily.   ISOSORBIDE MONONITRATE (IMDUR) 60  MG 24 HR TABLET    Take 30 mg by mouth daily.   MEMANTINE (NAMENDA) 10 MG TABLET    Take 10 mg by mouth 2 (two) times daily.   METOPROLOL (LOPRESSOR) 50 MG TABLET    Take 25 mg by mouth daily.   MULTIPLE VITAMINS-MINERALS (CERTAVITE SENIOR/ANTIOXIDANT PO)    Take 1 tablet by mouth daily.   OMEPRAZOLE-SODIUM BICARBONATE (ZEGERID) 40-1100 MG PER CAPSULE    Take 1 capsule by mouth 2 (two) times daily.   POLYETHYLENE GLYCOL (MIRALAX / GLYCOLAX) PACKET    Take 17 g by mouth daily.   SERTRALINE (ZOLOFT) 25 MG TABLET    Take 25 mg by mouth daily.  Modified Medications   No medications on file  Discontinued Medications   No medications on file    Physical Exam: Filed Vitals:   05/13/15 1620  BP: 150/70  Pulse: 72  Temp: 98.6 F (37 C)  TempSrc: Tympanic  Resp: 18   There is no weight on file to calculate BMI.  Physical Exam  Constitutional: She appears well-developed and well-nourished.  HENT:  Head: Normocephalic and atraumatic.  Eyes: EOM are normal. Pupils are equal, round, and reactive to light. Right eye exhibits no discharge. Left eye exhibits no discharge.  Neck: Normal range of motion. Neck supple. No JVD present. No thyromegaly present.  Cardiovascular: Normal rate and regular rhythm.   No murmur heard. Pulmonary/Chest: No respiratory distress. She has no wheezes. She has no rales.  Abdominal: Soft. Bowel sounds are normal. She exhibits no distension. There is no tenderness.  Musculoskeletal: Normal range of motion. She exhibits no edema.  Lymphadenopathy:    She has no cervical adenopathy.  Neurological: She is alert. She has normal reflexes. No cranial nerve deficit. She exhibits normal muscle tone. Coordination normal.  Skin: Skin is warm and dry. No rash noted. No erythema.  Healed site of excisional biopsy of skin cancer of right forehead. 1.25 inch scar.  Psychiatric:  persisted confusion, pacing, restlessness, not sleep much at night     Labs reviewed: Basic  Metabolic Panel:  Recent Labs  16/10/96 01/03/15  NA 145 144  K 3.9 4.2  BUN 17 22*  CREATININE 0.8 1.0    Liver Function Tests:  Recent Labs  06/21/14 01/03/15  AST 21 24  ALT 11 17  ALKPHOS 91 101    CBC:  Recent Labs  06/21/14 01/03/15  WBC 7.8 7.1  HGB 13.7 12.7  HCT 41 38  PLT 189 176    Lab Results  Component Value Date   TSH 2.01 01/03/2015   No results found for: HGBA1C No results found for: CHOL, HDL, LDLCALC, LDLDIRECT, TRIG, CHOLHDL  Significant Diagnostic Results since last visit: none  Patient Care Team: Kimber Relic, MD as PCP - General (Internal Medicine) [facility] Kristoph Sattler X, NP as Nurse Practitioner (Nurse Practitioner)  Assessment/Plan  Problem List Items Addressed This Visit    GERD (gastroesophageal reflux disease)    Stable. Continue Famotidine  daily.      Essential hypertension, benign (Chronic)    Controlled, continue Metoprolol  daily.       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       Dementia due to medical condition without behavioral disturbance - Primary    09/2013 MMSE 10/30 04/24/14 MMSE 8/30 Progressing, continue Namenda for now 05/13/15 increased confusion, lethargy, fatigue, malaise, will obtain UA C/S to r/o UTI      Coronary atherosclerosis of native coronary artery    No c/o angina since last visited. Continue Isosorbide  daily, Atorvastatin , ASA       Constipation    Stable. Continue MiraLax daily.      Adult failure to thrive (Chronic)    Continue to decline physically, cognitively, weight loss, ADL dependency. Continue to supportive care.           Family/ staff Communication: SNF MCU for care needs.   Labs/tests ordered: UA C/S  [name] Josemiguel Gries NP Geriatrics [facility] [address], [place] [address] On Call:  [phone] & follow prompts after 5pm & weekends Office Phone:  [phone] Office Fax:  [phone]

## 2015-05-14 ENCOUNTER — Encounter: Payer: Self-pay | Admitting: Nurse Practitioner

## 2015-05-14 NOTE — Assessment & Plan Note (Signed)
Continue to decline physically, cognitively, weight loss, ADL dependency. Continue to supportive care.  

## 2015-05-14 NOTE — Assessment & Plan Note (Signed)
Stable. Continue MiraLax daily.  

## 2015-05-14 NOTE — Assessment & Plan Note (Signed)
Improved restless, pacing, increased confusion after UTI fully treated,  continue Sertraline, off Mirtazapine 05/13/15 relapsed above stated behaviors, obtain UA C/S, observe the patient.

## 2015-05-14 NOTE — Assessment & Plan Note (Signed)
Stable. Continue Famotidine 20mg daily.  

## 2015-05-14 NOTE — Assessment & Plan Note (Signed)
No c/o angina since last visited. Continue Isosorbide 30mg daily, Atorvastatin 10mg, ASA 81mg 

## 2015-05-14 NOTE — Assessment & Plan Note (Addendum)
09/2013 MMSE 10/30 04/24/14 MMSE 8/30 Progressing, continue Namenda for now 05/13/15 increased confusion, lethargy, fatigue, malaise, will obtain UA C/S to r/o UTI

## 2015-05-14 NOTE — Assessment & Plan Note (Signed)
Controlled, continue Metoprolol 25mg daily 

## 2015-05-27 ENCOUNTER — Non-Acute Institutional Stay (SKILLED_NURSING_FACILITY): Payer: Medicare Other | Admitting: Nurse Practitioner

## 2015-05-27 ENCOUNTER — Encounter: Payer: Self-pay | Admitting: Nurse Practitioner

## 2015-05-27 ENCOUNTER — Other Ambulatory Visit: Payer: Self-pay | Admitting: Nurse Practitioner

## 2015-05-27 DIAGNOSIS — F329 Major depressive disorder, single episode, unspecified: Secondary | ICD-10-CM | POA: Diagnosis not present

## 2015-05-27 DIAGNOSIS — R627 Adult failure to thrive: Secondary | ICD-10-CM | POA: Diagnosis not present

## 2015-05-27 DIAGNOSIS — K219 Gastro-esophageal reflux disease without esophagitis: Secondary | ICD-10-CM

## 2015-05-27 DIAGNOSIS — I10 Essential (primary) hypertension: Secondary | ICD-10-CM | POA: Diagnosis not present

## 2015-05-27 DIAGNOSIS — F0280 Dementia in other diseases classified elsewhere without behavioral disturbance: Secondary | ICD-10-CM

## 2015-05-27 DIAGNOSIS — K5900 Constipation, unspecified: Secondary | ICD-10-CM | POA: Diagnosis not present

## 2015-05-27 DIAGNOSIS — I2510 Atherosclerotic heart disease of native coronary artery without angina pectoris: Secondary | ICD-10-CM | POA: Diagnosis not present

## 2015-05-27 DIAGNOSIS — F0393 Unspecified dementia, unspecified severity, with mood disturbance: Secondary | ICD-10-CM

## 2015-05-27 NOTE — Progress Notes (Signed)
Patient ID: Joann Baker, female   DOB: [birth year], 82 y.o.   MRN: [id number]

## 2015-05-28 LAB — TSH: TSH: 1.9 u[IU]/mL (ref 0.41–5.90)

## 2015-05-28 LAB — HEPATIC FUNCTION PANEL
ALT: 8 U/L (ref 7–35)
AST: 18 U/L (ref 13–35)
Alkaline Phosphatase: 75 U/L (ref 25–125)
BILIRUBIN, TOTAL: 0.6 mg/dL

## 2015-05-28 LAB — CBC AND DIFFERENTIAL
HEMATOCRIT: 37 % (ref 36–46)
HEMOGLOBIN: 12.2 g/dL (ref 12.0–16.0)
PLATELETS: 177 10*3/uL (ref 150–399)
WBC: 7 10*3/mL

## 2015-05-28 LAB — BASIC METABOLIC PANEL
BUN: 23 mg/dL — AB (ref 4–21)
Creatinine: 0.8 mg/dL (ref 0.5–1.1)
GLUCOSE: 70 mg/dL
Potassium: 4.1 mmol/L (ref 3.4–5.3)
Sodium: 142 mmol/L (ref 137–147)

## 2015-05-29 NOTE — Assessment & Plan Note (Signed)
09/2013 MMSE 10/30 04/24/14 MMSE 8/30 Progressing, continue Namenda for now 

## 2015-05-29 NOTE — Assessment & Plan Note (Signed)
Continue to decline physically, cognitively, weight loss, ADL dependency. Continue to supportive care. CBC, CMP, TSH 05/28/15 unremarkable.

## 2015-05-29 NOTE — Assessment & Plan Note (Signed)
Stable. Continue Famotidine 20mg daily.  

## 2015-05-29 NOTE — Assessment & Plan Note (Signed)
Controlled, continue Metoprolol  daily. 05/28/15 Na 142, K 4.1, Bun 23, creat 0.83

## 2015-05-29 NOTE — Assessment & Plan Note (Signed)
Continue to decline physically, cognitively, and emotionally, continue Sertraline, off Mirtazapine

## 2015-05-29 NOTE — Assessment & Plan Note (Signed)
Stable. Continue MiraLax daily.  

## 2015-05-29 NOTE — Progress Notes (Signed)
Patient ID: Joann Baker, female   DOB: [birth year], 82 y.o.   MRN: [id number]  Location:  SNF [facility] Provider:  Chipper Oman NP  Code Status:  DNR Goals of care: Advanced Directive information Does patient have an advance directive?: Yes, Type of Advance Directive: Out of facility DNR (pink MOST or yellow form)  Chief Complaint  Patient presents with  . Acute Visit    increased fatigue     HPI: Patient is a 82 y.o. female seen in the SNF at [facility] today for evaluation of malaise, fatigue, increased confusion, Hx of dementia, depression, HTN, constipation, GERD, FTT  Review of Systems  Constitutional: Positive for weight loss. Negative for fever and diaphoresis.  HENT: Negative.  Negative for congestion and hearing loss.   Eyes: Negative.  Negative for pain and discharge.  Respiratory: Negative.  Negative for cough, shortness of breath and wheezing.   Cardiovascular: Negative for chest pain, palpitations and leg swelling.  Gastrointestinal: Negative.  Negative for vomiting, abdominal pain, diarrhea and constipation.  Genitourinary: Positive for urgency. Negative for dysuria and frequency.  Musculoskeletal: Positive for back pain. Negative for myalgias, joint pain and neck pain.  Skin: Negative for rash.  Neurological: Negative for tingling, speech change, focal weakness, seizures and headaches.       Dementia. 09/10/12 MMSE; total 19/30   Psychiatric/Behavioral: Positive for memory loss. Negative for depression and hallucinations. The patient is not nervous/anxious.     Past Medical History  Diagnosis Date  . Hypertension   . DJD (degenerative joint disease)   . Hyperlipidemia   . Coronary atherosclerosis of native coronary artery   . Adjustment disorder with depressed mood   . Dementia in conditions classified elsewhere without behavioral disturbance   . Right bundle branch block   . Asthma   . GERD (gastroesophageal reflux disease)   . Unspecified constipation     . Pain in joint, pelvic region and thigh     left  . Osteoporosis   . Unspecified urinary incontinence   . Anemia   . Depression   . Edema   . Urinary tract infection, site not specified   . History of fall   . Constipation   . Insomnia     Patient Active Problem List   Diagnosis Date Noted  . Adult failure to thrive 02/18/2015  . Loss of weight 10/15/2014  . Depression due to dementia 06/20/2014  . Seborrheic dermatitis, unspecified 12/14/2013  . Neuropathy of both upper extremities (HCC) 12/14/2013  . Dementia due to medical condition without behavioral disturbance   . DJD (degenerative joint disease)   . Coronary atherosclerosis of native coronary artery   . History of fall   . Constipation   . Dyslipidemia 03/02/2013  . Skin lesion of scalp 01/02/2013  . GERD (gastroesophageal reflux disease) 01/02/2013  . Infection of urinary tract 08/03/2012  . Insomnia 08/03/2012  . CAD (coronary artery disease) of artery bypass graft 08/03/2012  . Osteoarthritis of left hip 07/14/2012  . Lumbosacral spondylosis without myelopathy 07/14/2012  . Essential hypertension, benign 07/14/2012  . Urinary incontinence due to cognitive impairment 06/23/2011  . Dementia 06/23/2011  . Femur fracture, left (HCC) 06/19/2011    Allergies  Allergen Reactions  . Alendronate Sodium     Per mar  . Asmanex 120 Metered Doses [Mometasone Furoate]   . Budesonide-Formoterol Fumarate     Per mar  . Fluticasone Propionate (Inhal)     Per mar  . Mometasone Furoate  Per mar  . Omeprazole     Per mar  . Valium [Diazepam]     Medications: Patient's Medications  New Prescriptions   No medications on file  Previous Medications   ACETAMINOPHEN (TYLENOL) 650 MG CR TABLET    Take 650 mg by mouth. Take one every 6 hours as needed for pain   ASPIRIN 81 MG CHEWABLE TABLET    Chew 81 mg by mouth daily.   ATORVASTATIN (LIPITOR) 10 MG TABLET    Take 10 mg by mouth every evening.   CALCIUM-VITAMIN D  (OSCAL WITH D) 500-200 MG-UNIT PER TABLET    Take 1 tablet by mouth 2 (two) times daily.   FAMOTIDINE (PEPCID) 20 MG TABLET    Take 20 mg by mouth at bedtime.   FEEDING SUPPLEMENT (BOOST / RESOURCE BREEZE) LIQD    Take 1 Container by mouth 3 (three) times daily between meals.   ISOSORBIDE MONONITRATE (IMDUR) 60 MG 24 HR TABLET    Take 30 mg by mouth daily.   MEMANTINE (NAMENDA) 10 MG TABLET    Take 10 mg by mouth 2 (two) times daily.   METOPROLOL TARTRATE (LOPRESSOR) 25 MG TABLET    Take 25 mg by mouth daily.   MULTIPLE VITAMINS-MINERALS (CERTAVITE SENIOR/ANTIOXIDANT PO)    Take 1 tablet by mouth daily.   POLYETHYLENE GLYCOL (MIRALAX / GLYCOLAX) PACKET    Take 17 g by mouth daily.   SERTRALINE (ZOLOFT) 25 MG TABLET    Take 25 mg by mouth daily.  Modified Medications   No medications on file  Discontinued Medications   METOPROLOL (LOPRESSOR) 50 MG TABLET    Take 25 mg by mouth daily. Reported on 05/27/2015   OMEPRAZOLE-SODIUM BICARBONATE (ZEGERID) 40-1100 MG PER CAPSULE    Take 1 capsule by mouth 2 (two) times daily. Reported on 05/27/2015    Physical Exam: Filed Vitals:   05/27/15 1241  BP: 118/68  Pulse: 70  Temp: 98.1 F (36.7 C)  TempSrc: Oral  Resp: 20  Height: 5\' 4"  (1.626 m)  Weight: 117 lb 9.6 oz (53.343 kg)   Body mass index is 20.18 kg/(m^2).  Physical Exam  Constitutional: She appears well-developed and well-nourished.  HENT:  Head: Normocephalic and atraumatic.  Eyes: EOM are normal. Pupils are equal, round, and reactive to light. Right eye exhibits no discharge. Left eye exhibits no discharge.  Neck: Normal range of motion. Neck supple. No JVD present. No thyromegaly present.  Cardiovascular: Normal rate and regular rhythm.   No murmur heard. Pulmonary/Chest: No respiratory distress. She has no wheezes. She has no rales.  Abdominal: Soft. Bowel sounds are normal. She exhibits no distension. There is no tenderness.  Musculoskeletal: Normal range of motion. She exhibits  no edema.  Lymphadenopathy:    She has no cervical adenopathy.  Neurological: She is alert. She has normal reflexes. No cranial nerve deficit. She exhibits normal muscle tone. Coordination normal.  Skin: Skin is warm and dry. No rash noted. No erythema.  Healed site of excisional biopsy of skin cancer of right forehead. 1.25 inch scar.  Psychiatric:  persisted confusion, pacing, restlessness, not sleep much at night     Labs reviewed: Basic Metabolic Panel:  Recent Labs  16/10/96 01/03/15  NA 145 144  K 3.9 4.2  BUN 17 22*  CREATININE 0.8 1.0    Liver Function Tests:  Recent Labs  06/21/14 01/03/15  AST 21 24  ALT 11 17  ALKPHOS 91 101    CBC:  Recent  Labs  06/21/14 01/03/15  WBC 7.8 7.1  HGB 13.7 12.7  HCT 41 38  PLT 189 176    Lab Results  Component Value Date   TSH 2.01 01/03/2015   No results found for: HGBA1C No results found for: CHOL, HDL, LDLCALC, LDLDIRECT, TRIG, CHOLHDL  Significant Diagnostic Results since last visit: none  Patient Care Team: Kimber Relic, MD as PCP - General (Internal Medicine) [facility] Derick Seminara X, NP as Nurse Practitioner (Nurse Practitioner)  Assessment/Plan Problem List Items Addressed This Visit    Essential hypertension, benign - Primary (Chronic)    Controlled, continue Metoprolol  daily. 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 Continue to supportive care. CBC, CMP, TSH 05/28/15 unremarkable.       GERD (gastroesophageal reflux disease)    Stable. Continue Famotidine  daily.      Relevant Medications   famotidine (PEPCID) 20 MG tablet   Dementia due to medical  condition without behavioral disturbance    09/2013 MMSE 10/30 04/24/14 MMSE 8/30 Progressing, continue Namenda for now      Coronary atherosclerosis of native coronary artery    No c/o angina since last visited. Continue Isosorbide  daily, Atorvastatin , ASA       Relevant Medications   metoprolol tartrate (LOPRESSOR) 25 MG tablet   Constipation    Stable. Continue MiraLax daily.          Family/ staff Communication: SNF [facility] for care needs.   Labs/tests ordered: CBC, CMP, TSH done 05/28/15  [facility] Jennice Renegar NP Geriatrics [facility] [address], [place] [address] On Call:  [phone] & follow prompts after 5pm & weekends Office Phone:  [phone] Office Fax:  [phone]

## 2015-05-29 NOTE — Assessment & Plan Note (Signed)
No c/o angina since last visited. Continue Isosorbide 30mg daily, Atorvastatin 10mg, ASA 81mg 

## 2015-07-01 ENCOUNTER — Non-Acute Institutional Stay (SKILLED_NURSING_FACILITY): Payer: Medicare Other | Admitting: Nurse Practitioner

## 2015-07-01 ENCOUNTER — Encounter: Payer: Self-pay | Admitting: Nurse Practitioner

## 2015-07-01 DIAGNOSIS — F329 Major depressive disorder, single episode, unspecified: Secondary | ICD-10-CM

## 2015-07-01 DIAGNOSIS — R3981 Functional urinary incontinence: Secondary | ICD-10-CM

## 2015-07-01 DIAGNOSIS — N390 Urinary tract infection, site not specified: Secondary | ICD-10-CM | POA: Diagnosis not present

## 2015-07-01 DIAGNOSIS — F0280 Dementia in other diseases classified elsewhere without behavioral disturbance: Secondary | ICD-10-CM

## 2015-07-01 DIAGNOSIS — K219 Gastro-esophageal reflux disease without esophagitis: Secondary | ICD-10-CM | POA: Diagnosis not present

## 2015-07-01 DIAGNOSIS — I10 Essential (primary) hypertension: Secondary | ICD-10-CM

## 2015-07-01 DIAGNOSIS — K5900 Constipation, unspecified: Secondary | ICD-10-CM

## 2015-07-01 DIAGNOSIS — I2510 Atherosclerotic heart disease of native coronary artery without angina pectoris: Secondary | ICD-10-CM

## 2015-07-01 DIAGNOSIS — R627 Adult failure to thrive: Secondary | ICD-10-CM

## 2015-07-01 DIAGNOSIS — F0393 Unspecified dementia, unspecified severity, with mood disturbance: Secondary | ICD-10-CM

## 2015-07-01 NOTE — Assessment & Plan Note (Signed)
No c/o angina since last visited. Continue Isosorbide 30mg daily, Atorvastatin 10mg, ASA 81mg 

## 2015-07-01 NOTE — Assessment & Plan Note (Signed)
09/2013 MMSE 10/30 04/24/14 MMSE 8/30 Progressing, continue Namenda for now 

## 2015-07-01 NOTE — Assessment & Plan Note (Signed)
Adult brief for incontinent care 

## 2015-07-01 NOTE — Progress Notes (Signed)
Patient ID: Joann Baker, female   DOB: 06/24/1924, 82 y.o.   MRN: [id number]  Location:  [facility] Room Number: 100 Place of Service:  SNF (31) Provider: Arna SnipeManXie Koa Zoeller NP  GREEN, Lenon CurtARTHUR G, MD  Patient Care Team: Kimber RelicArthur G Green, MD as PCP - General (Internal Medicine) [facility] Marten Iles X, NP as Nurse Practitioner (Nurse Practitioner)  Extended Emergency Contact Information Primary Emergency Contact: [facility] Address: [address]          [place],  [id number] Home Phone: [phone] Relation: None  Code Status:  DNR Goals of care: Advanced Directive information Advanced Directives 07/01/2015  Does patient have an advance directive? Yes  Type of Advance Directive Living will;Out of facility DNR (pink MOST or yellow form)  Copy of advanced directive(s) in chart? Yes     Chief Complaint  Patient presents with  . Medical Management of Chronic Issues    Routine Visit    HPI:  Pt is a 82 y.o. female seen today for medical management of chronic diseases.     Past Medical History  Diagnosis Date  . Hypertension   . DJD (degenerative joint disease)   . Hyperlipidemia   . Coronary atherosclerosis of native coronary artery   . Adjustment disorder with depressed mood   . Dementia in conditions classified elsewhere without behavioral disturbance   . Right bundle branch block   . Asthma   . GERD (gastroesophageal reflux disease)   . Unspecified constipation   . Pain in joint, pelvic region and thigh     left  . Osteoporosis   . Unspecified urinary incontinence   . Anemia   . Depression   . Edema   . Urinary tract infection, site not specified   . History of fall   . Constipation   . Insomnia    Past Surgical History  Procedure Laterality Date  . Femur im nail  06/19/2011    Procedure: INTRAMEDULLARY (IM) NAIL FEMORAL;  Surgeon: Harvie JuniorJohn L Graves, MD;  Location: [facility] OR;  Service: Orthopedics;  Laterality: Left;  . Abdominal  hysterectomy    . Cholecystectomy    . Skin cancer excision  02/15/2013    right forehead Dr. Irene LimboGoodrich    Allergies  Allergen Reactions  . Alendronate Sodium     Per mar  . Asmanex 120 Metered Doses [Mometasone Furoate]   . Budesonide-Formoterol Fumarate     Per mar  . Fluticasone Propionate (Inhal)     Per mar  . Mometasone Furoate     Per mar  . Omeprazole     Per mar  . Valium [Diazepam]       Medication List       This list is accurate as of: 07/01/15 11:59 PM.  Always use your most recent med list.               acetaminophen 650 MG CR tablet  Commonly known as:  TYLENOL  Take 650 mg by mouth. Take one every 6 hours as needed for pain     aspirin 81 MG chewable tablet  Chew 81 mg by mouth daily.     atorvastatin 10 MG tablet  Commonly known as:  LIPITOR  Take 10 mg by mouth every evening.     calcium-vitamin D 500-200 MG-UNIT tablet  Commonly known as:  OSCAL WITH D  Take 1 tablet by mouth 2 (two) times daily.     CERTAVITE SENIOR/ANTIOXIDANT PO  Take 1 tablet by mouth daily.     famotidine 20 MG tablet  Commonly known as:  PEPCID  Take 20 mg by mouth at bedtime.     feeding supplement Liqd  Take 1 Container by mouth 3 (three) times daily between meals.     isosorbide mononitrate 60 MG 24 hr tablet  Commonly known as:  IMDUR  Take 30 mg by mouth daily.     memantine 10 MG tablet  Commonly known as:  NAMENDA  Take 10 mg by mouth 2 (two) times daily.     metoprolol tartrate 25 MG tablet  Commonly known as:  LOPRESSOR  Take 25 mg by mouth daily.     polyethylene glycol packet  Commonly known as:  MIRALAX / GLYCOLAX  Take 17 g by mouth daily.     sertraline 25 MG tablet  Commonly known as:  ZOLOFT  Take 25 mg by mouth daily.        Review of Systems  Constitutional: Negative for fever and diaphoresis.  HENT: Negative.  Negative for congestion and hearing loss.   Eyes: Negative.  Negative for pain and discharge.  Respiratory:  Negative.  Negative for cough, shortness of breath and wheezing.   Cardiovascular: Negative for chest pain, palpitations and leg swelling.  Gastrointestinal: Negative.  Negative for vomiting, abdominal pain, diarrhea and constipation.  Genitourinary: Positive for urgency. Negative for dysuria and frequency.  Musculoskeletal: Positive for back pain. Negative for myalgias and neck pain.  Skin: Negative for rash.  Neurological: Negative for seizures and headaches.       Dementia. 09/10/12 MMSE; total 19/30   Psychiatric/Behavioral: Negative for hallucinations. The patient is not nervous/anxious.     Immunization History  Administered Date(s) Administered  . Influenza Whole 02/24/2012, 02/23/2013  . Influenza-Unspecified 03/04/2014, 01/08/2015  . PPD Test 06/12/2014  . Pneumococcal Polysaccharide-23 04/20/2008  . Td 04/21/2003   Pertinent  Health Maintenance Due  Topic Date Due  . DEXA SCAN  06/26/1991  . PNA vac Low Risk Adult (2 of 2 - PCV13) 04/20/2009  . INFLUENZA VACCINE  11/19/2015   Fall Risk  03/11/2015  Falls in the past year? No  Risk for fall due to : Impaired balance/gait   Functional Status Survey:    Filed Vitals:   07/01/15 0853  Height:  (1.575 m)  Weight: 120 lb 3.2 oz (54.522 kg)   Body mass index is 21.98 kg/(m^2). Physical Exam  Constitutional: She appears well-developed and well-nourished.  HENT:  Head: Normocephalic and atraumatic.  Eyes: EOM are normal. Pupils are equal, round, and reactive to light. Right eye exhibits no discharge. Left eye exhibits no discharge.  Neck: Normal range of motion. Neck supple. No JVD present. No thyromegaly present.  Cardiovascular: Normal rate and regular rhythm.   No murmur heard. Pulmonary/Chest: No respiratory distress. She has no wheezes. She has no rales.  Abdominal: Soft. Bowel sounds are normal. She exhibits no distension. There is no tenderness.  Musculoskeletal: Normal range of motion. She exhibits no  edema.  Lymphadenopathy:    She has no cervical adenopathy.  Neurological: She is alert. She has normal reflexes. No cranial nerve deficit. She exhibits normal muscle tone. Coordination normal.  Skin: Skin is warm and dry. No rash noted. No erythema.  Healed site of excisional biopsy of skin cancer of right forehead. 1.25 inch scar.  Psychiatric:  persisted confusion, pacing, restlessness, not sleep much at night     Labs reviewed:  Recent Labs  01/03/15 05/28/15  NA 144 142  K 4.2 4.1  BUN 22* 23*  CREATININE 1.0 0.8    Recent Labs  01/03/15 05/28/15  AST 24 18  ALT 17 8  ALKPHOS 101 75    Recent Labs  01/03/15 05/28/15  WBC 7.1 7.0  HGB 12.7 12.2  HCT 38 37  PLT 176 177   Lab Results  Component Value Date   TSH 1.90 05/28/2015   No results found for: HGBA1C No results found for: CHOL, HDL, LDLCALC, LDLDIRECT, TRIG, CHOLHDL  Significant Diagnostic Results in last 30 days:  No results found.  Assessment/Plan  Depression due to dementia 07/16/14 crying episodes, poor appetite, lethargy-labs unremarkable-repeat UA C/S and starts Sertraline  daily. Off Mirtazapine.  07/01/15 increased Sertraline to  daily related to worsening mood, anxiety, agitation, and attempts to leave facility   Infection of urinary tract Last treated 05/30/15 urine culture Viridans streptococcus, enterococcus, Augmentin  q12h x 7 days.    Urinary incontinence due to cognitive impairment Adult brief for incontinent care  GERD (gastroesophageal reflux disease) Stable. Continue Famotidine  daily.  Essential hypertension, benign Controlled, continue Metoprolol  daily. 05/28/15 Na 142, K 4.1, Bun 23, creat 0.83  Dementia due to medical condition without behavioral disturbance 09/2013 MMSE 10/30 04/24/14 MMSE 8/30 Progressing, continue Namenda for now  Coronary atherosclerosis of native coronary artery No c/o angina since last visited. Continue Isosorbide  daily,  Atorvastatin , ASA   Constipation Stable. Continue MiraLax daily.  Adult failure to thrive Continue to decline physically, cognitively, weight loss, ADL dependency. Continue to supportive care. CBC, CMP, TSH 05/28/15 unremarkable. Treated UTI has limited efficacy in improving mood or physical health. Continue worsening mood, agitation, anxiety, attempting leaving facility, will increase Sertraline to , observe the patient.     Family/ staff Communication: continue SNF for care needs  Labs/tests ordered:  none

## 2015-07-01 NOTE — Assessment & Plan Note (Signed)
Controlled, continue Metoprolol 25mg  daily. 05/28/15 Na 142, K 4.1, Bun 23, creat 0.83

## 2015-07-01 NOTE — Assessment & Plan Note (Signed)
Continue to decline physically, cognitively, weight loss, ADL dependency. Continue to supportive care. CBC, CMP, TSH 05/28/15 unremarkable. Treated UTI has limited efficacy in improving mood or physical health. Continue worsening mood, agitation, anxiety, attempting leaving facility, will increase Sertraline to 50mg , observe the patient.

## 2015-07-01 NOTE — Assessment & Plan Note (Signed)
Stable. Continue Famotidine 20mg daily.  

## 2015-07-01 NOTE — Assessment & Plan Note (Signed)
Last treated 05/30/15 urine culture Viridans streptococcus, enterococcus, Augmentin 875mg  q12h x 7 days.

## 2015-07-01 NOTE — Assessment & Plan Note (Signed)
Stable. Continue MiraLax daily.  

## 2015-07-01 NOTE — Assessment & Plan Note (Signed)
07/16/14 crying episodes, poor appetite, lethargy-labs unremarkable-repeat UA C/S and starts Sertraline 25mg  daily. Off Mirtazapine.  07/01/15 increased Sertraline to 50mg  daily related to worsening mood, anxiety, agitation, and attempts to leave facility

## 2015-07-29 ENCOUNTER — Encounter: Payer: Self-pay | Admitting: Nurse Practitioner

## 2015-07-29 ENCOUNTER — Non-Acute Institutional Stay (SKILLED_NURSING_FACILITY): Payer: Medicare Other | Admitting: Nurse Practitioner

## 2015-07-29 DIAGNOSIS — F329 Major depressive disorder, single episode, unspecified: Secondary | ICD-10-CM

## 2015-07-29 DIAGNOSIS — F0393 Unspecified dementia, unspecified severity, with mood disturbance: Secondary | ICD-10-CM

## 2015-07-29 DIAGNOSIS — F0280 Dementia in other diseases classified elsewhere without behavioral disturbance: Secondary | ICD-10-CM

## 2015-07-29 DIAGNOSIS — R627 Adult failure to thrive: Secondary | ICD-10-CM | POA: Diagnosis not present

## 2015-07-29 DIAGNOSIS — I10 Essential (primary) hypertension: Secondary | ICD-10-CM

## 2015-07-29 DIAGNOSIS — K5900 Constipation, unspecified: Secondary | ICD-10-CM

## 2015-07-29 DIAGNOSIS — K219 Gastro-esophageal reflux disease without esophagitis: Secondary | ICD-10-CM

## 2015-07-29 DIAGNOSIS — I2510 Atherosclerotic heart disease of native coronary artery without angina pectoris: Secondary | ICD-10-CM

## 2015-07-29 NOTE — Assessment & Plan Note (Signed)
No c/o angina since last visited. Continue Isosorbide 30mg daily, Atorvastatin 10mg, ASA 81mg 

## 2015-07-29 NOTE — Assessment & Plan Note (Signed)
Continue to decline physically, cognitively, weight loss, ADL dependency. Continue to supportive care. CBC, CMP, TSH 05/28/15 unremarkable. Treated UTI has limited efficacy in improving mood or physical health. Continue worsening mood, agitation, anxiety, attempting leaving facility, continue to stabilize mood with Sertraline.

## 2015-07-29 NOTE — Assessment & Plan Note (Signed)
07/16/14 crying episodes, poor appetite, lethargy-labs unremarkable-repeat UA C/S and starts Sertraline 25mg daily. Off Mirtazapine.  Improved mood, anxiety, agitation, and attempts to leave facility, continue Sertraline    

## 2015-07-29 NOTE — Assessment & Plan Note (Signed)
Stable. Continue Famotidine 20mg daily.  

## 2015-07-29 NOTE — Assessment & Plan Note (Signed)
Controlled, continue Metoprolol 25mg  daily. 05/28/15 Na 142, K 4.1, Bun 23, creat 0.83

## 2015-07-29 NOTE — Assessment & Plan Note (Signed)
Stable. Continue MiraLax daily.  

## 2015-07-29 NOTE — Assessment & Plan Note (Signed)
09/2013 MMSE 10/30 04/24/14 MMSE 8/30 Progressing, continue Namenda for now 

## 2015-07-29 NOTE — Progress Notes (Signed)
Patient ID: Joann Baker, female   DOB: 1924-05-27, 82 y.o.   MRN: [id number]  Location:  [facility] Room Number: 100 Place of Service:  SNF (31) Provider: Arna Snipe Anani Gu NP  GREEN, Lenon Curt, MD  Patient Care Team: Kimber Relic, MD as PCP - General (Internal Medicine) [facility] Dabid Godown X, NP as Nurse Practitioner (Nurse Practitioner)  Extended Emergency Contact Information Primary Emergency Contact: [facility] Address: [address]          [name],  [address] Home Phone: [phone] Relation: None  Code Status:  DNR Goals of care: Advanced Directive information Advanced Directives 07/29/2015  Does patient have an advance directive? Yes  Type of Advance Directive Living will;Out of facility DNR (pink MOST or yellow form)  Does patient want to make changes to advanced directive? No - Patient declined  Copy of advanced directive(s) in chart? Yes     Chief Complaint  Patient presents with  . Medical Management of Chronic Issues    Routine Visit    HPI:  Pt is a 82 y.o. female seen today for medical management of chronic diseases.  Hx of dementia, resides in [facility], taking Namenda for memory,  depression, better with Sertraline, HTN, controlled on Metoprolol and Imdur, constipation, stable on MirLax daily, GERD, stable while on Famotidine , FTT supportive measurs.    Past Medical History  Diagnosis Date  . Hypertension   . DJD (degenerative joint disease)   . Hyperlipidemia   . Coronary atherosclerosis of native coronary artery   . Adjustment disorder with depressed mood   . Dementia in conditions classified elsewhere without behavioral disturbance   . Right bundle branch block   . Asthma   . GERD (gastroesophageal reflux disease)   . Unspecified constipation   . Pain in joint, pelvic region and thigh     left  . Osteoporosis   . Unspecified urinary incontinence   . Anemia   . Depression   . Edema   . Urinary tract  infection, site not specified   . History of fall   . Constipation   . Insomnia    Past Surgical History  Procedure Laterality Date  . Femur im nail  06/19/2011    Procedure: INTRAMEDULLARY (IM) NAIL FEMORAL;  Surgeon: Harvie Junior, MD;  Location: [facility] OR;  Service: Orthopedics;  Laterality: Left;  . Abdominal hysterectomy    . Cholecystectomy    . Skin cancer excision  02/15/2013    right forehead Dr. Irene Limbo    Allergies  Allergen Reactions  . Alendronate Sodium     Per mar  . Asmanex 120 Metered Doses [Mometasone Furoate]   . Budesonide-Formoterol Fumarate     Per mar  . Fluticasone Propionate (Inhal)     Per mar  . Mometasone Furoate     Per mar  . Omeprazole     Per mar  . Valium [Diazepam]       Medication List       This list is accurate as of: 07/29/15  1:19 PM.  Always use your most recent med list.               acetaminophen 650 MG CR tablet  Commonly known as:  TYLENOL  Take 650 mg by mouth. Take one every 6 hours as needed for pain     aspirin 81 MG chewable tablet  Chew 81 mg by mouth daily.     atorvastatin 10  MG tablet  Commonly known as:  LIPITOR  Take 10 mg by mouth every evening.     calcium-vitamin D 500-200 MG-UNIT tablet  Commonly known as:  OSCAL WITH D  Take 1 tablet by mouth 2 (two) times daily.     CERTAVITE SENIOR/ANTIOXIDANT PO  Take 1 tablet by mouth daily.     famotidine 20 MG tablet  Commonly known as:  PEPCID  Take 20 mg by mouth at bedtime.     feeding supplement Liqd  Take 1 Container by mouth 3 (three) times daily between meals.     isosorbide mononitrate 60 MG 24 hr tablet  Commonly known as:  IMDUR  Take 30 mg by mouth daily.     memantine 10 MG tablet  Commonly known as:  NAMENDA  Take 10 mg by mouth 2 (two) times daily.     metoprolol tartrate 25 MG tablet  Commonly known as:  LOPRESSOR  Take 25 mg by mouth daily.     polyethylene glycol packet  Commonly known as:  MIRALAX / GLYCOLAX  Take 17 g by  mouth daily.     sertraline 25 MG tablet  Commonly known as:  ZOLOFT  Take 25 mg by mouth daily.        Review of Systems  Constitutional: Negative for fever and diaphoresis.  HENT: Negative.  Negative for congestion and hearing loss.   Eyes: Negative.  Negative for pain and discharge.  Respiratory: Negative.  Negative for cough, shortness of breath and wheezing.   Cardiovascular: Negative for chest pain, palpitations and leg swelling.  Gastrointestinal: Negative.  Negative for vomiting, abdominal pain, diarrhea and constipation.  Genitourinary: Positive for urgency. Negative for dysuria and frequency.  Musculoskeletal: Positive for back pain. Negative for myalgias and neck pain.  Skin: Negative for rash.  Neurological: Negative for seizures and headaches.       Dementia. 09/10/12 MMSE; total 19/30   Psychiatric/Behavioral: Negative for hallucinations. The patient is not nervous/anxious.     Immunization History  Administered Date(s) Administered  . Influenza Whole 02/24/2012, 02/23/2013  . Influenza-Unspecified 03/04/2014, 01/08/2015  . PPD Test 06/12/2014  . Pneumococcal Polysaccharide-23 04/20/2008  . Td 04/21/2003   Pertinent  Health Maintenance Due  Topic Date Due  . DEXA SCAN  06/26/1991  . PNA vac Low Risk Adult (2 of 2 - PCV13) 04/20/2009  . INFLUENZA VACCINE  11/19/2015   Fall Risk  03/11/2015  Falls in the past year? No  Risk for fall due to : Impaired balance/gait   Functional Status Survey:    Filed Vitals:   07/29/15 1107  BP: 120/70  Pulse: 52  Temp: 96.5 F (35.8 C)  TempSrc: Oral  Resp: 20  Height:  (1.626 m)  Weight: 119 lb 3.2 oz (54.069 kg)  SpO2: 99%   Body mass index is 20.45 kg/(m^2). Physical Exam  Constitutional: She appears well-developed and well-nourished.  HENT:  Head: Normocephalic and atraumatic.  Eyes: EOM are normal. Pupils are equal, round, and reactive to light. Right eye exhibits no discharge. Left eye exhibits no  discharge.  Neck: Normal range of motion. Neck supple. No JVD present. No thyromegaly present.  Cardiovascular: Normal rate and regular rhythm.   No murmur heard. Pulmonary/Chest: No respiratory distress. She has no wheezes. She has no rales.  Abdominal: Soft. Bowel sounds are normal. She exhibits no distension. There is no tenderness.  Musculoskeletal: Normal range of motion. She exhibits no edema.  Lymphadenopathy:    She has  no cervical adenopathy.  Neurological: She is alert. She has normal reflexes. No cranial nerve deficit. She exhibits normal muscle tone. Coordination normal.  Skin: Skin is warm and dry. No rash noted. No erythema.  Healed site of excisional biopsy of skin cancer of right forehead. 1.25 inch scar.  Psychiatric:  persisted confusion, pacing, restlessness, not sleep much at night     Labs reviewed:  Recent Labs  01/03/15 05/28/15  NA 144 142  K 4.2 4.1  BUN 22* 23*  CREATININE 1.0 0.8    Recent Labs  01/03/15 05/28/15  AST 24 18  ALT 17 8  ALKPHOS 101 75    Recent Labs  01/03/15 05/28/15  WBC 7.1 7.0  HGB 12.7 12.2  HCT 38 37  PLT 176 177   Lab Results  Component Value Date   TSH 1.90 05/28/2015   No results found for: HGBA1C No results found for: CHOL, HDL, LDLCALC, LDLDIRECT, TRIG, CHOLHDL  Significant Diagnostic Results in last 30 days:  No results found.  Assessment/Plan  Adult failure to thrive Continue to decline physically, cognitively, weight loss, ADL dependency. Continue to supportive care. CBC, CMP, TSH 05/28/15 unremarkable. Treated UTI has limited efficacy in improving mood or physical health. Continue worsening mood, agitation, anxiety, attempting leaving facility, continue to stabilize mood with Sertraline.    Constipation Stable. Continue MiraLax daily.  Coronary atherosclerosis of native coronary artery No c/o angina since last visited. Continue Isosorbide 30mg  daily, Atorvastatin 10mg , ASA 81mg   Dementia due to  medical condition without behavioral disturbance 09/2013 MMSE 10/30 04/24/14 MMSE 8/30 Progressing, continue Namenda for now  Depression due to dementia 07/16/14 crying episodes, poor appetite, lethargy-labs unremarkable-repeat UA C/S and starts Sertraline 25mg  daily. Off Mirtazapine.  Improved mood, anxiety, agitation, and attempts to leave facility, continue Sertraline   GERD (gastroesophageal reflux disease) Stable. Continue Famotidine 20mg  daily.  Essential hypertension, benign Controlled, continue Metoprolol 25mg  daily. 05/28/15 Na 142, K 4.1, Bun 23, creat 0.83    Family/ staff Communication: continue SNF for care needs  Labs/tests ordered:  none

## 2015-08-30 ENCOUNTER — Non-Acute Institutional Stay (SKILLED_NURSING_FACILITY): Payer: Medicare Other | Admitting: Nurse Practitioner

## 2015-08-30 ENCOUNTER — Encounter: Payer: Self-pay | Admitting: Nurse Practitioner

## 2015-08-30 DIAGNOSIS — F0280 Dementia in other diseases classified elsewhere without behavioral disturbance: Secondary | ICD-10-CM

## 2015-08-30 DIAGNOSIS — R627 Adult failure to thrive: Secondary | ICD-10-CM

## 2015-08-30 DIAGNOSIS — I10 Essential (primary) hypertension: Secondary | ICD-10-CM

## 2015-08-30 DIAGNOSIS — K219 Gastro-esophageal reflux disease without esophagitis: Secondary | ICD-10-CM | POA: Diagnosis not present

## 2015-08-30 DIAGNOSIS — I2510 Atherosclerotic heart disease of native coronary artery without angina pectoris: Secondary | ICD-10-CM | POA: Diagnosis not present

## 2015-08-30 DIAGNOSIS — K5900 Constipation, unspecified: Secondary | ICD-10-CM

## 2015-08-30 DIAGNOSIS — F329 Major depressive disorder, single episode, unspecified: Secondary | ICD-10-CM

## 2015-08-30 DIAGNOSIS — F0393 Unspecified dementia, unspecified severity, with mood disturbance: Secondary | ICD-10-CM

## 2015-08-30 NOTE — Assessment & Plan Note (Signed)
Stable. Continue Famotidine 20mg daily.  

## 2015-08-30 NOTE — Assessment & Plan Note (Signed)
Stable. Continue MiraLax daily.  

## 2015-08-30 NOTE — Assessment & Plan Note (Signed)
Continue to decline physically, cognitively, weight loss, ADL dependency. Continue to supportive care. CBC, CMP, TSH 05/28/15 unremarkable. Treated UTI has limited efficacy in improving mood or physical health. Continue worsening mood, agitation, anxiety, attempting leaving facility, continue to stabilize mood with Sertraline.

## 2015-08-30 NOTE — Assessment & Plan Note (Signed)
09/2013 MMSE 10/30 04/24/14 MMSE 8/30 Progressing, continue Namenda for now 

## 2015-08-30 NOTE — Progress Notes (Signed)
Patient ID: Joann Baker, female   DOB: Mar 22, 1925, 82 y.o.   MRN: [id number]  Location:  [facility] Room Number: 100 Place of Service:  SNF (31) Provider: Arna Snipe Rudra Hobbins NP  GREEN, Lenon Curt, MD  Patient Care Team: Kimber Relic, MD as PCP - General (Internal Medicine) [facility] Arnecia Ector X, NP as Nurse Practitioner (Nurse Practitioner)  Extended Emergency Contact Information Primary Emergency Contact: [facility] Address: [address] [place]          [name],  [address] Home Phone: [phone] Relation: None  Code Status:  DNR Goals of care: Advanced Directive information Advanced Directives 08/30/2015  Does patient have an advance directive? Yes  Type of Advance Directive Living will;Out of facility DNR (pink MOST or yellow form)  Does patient want to make changes to advanced directive? No - Patient declined  Copy of advanced directive(s) in chart? Yes     Chief Complaint  Patient presents with  . Medical Management of Chronic Issues    Routine Visit    HPI:  Pt is a 82 y.o. female seen today for medical management of chronic diseases.  Hx of dementia, resides in [facility] SN[facility], taking Namenda for memory,  depression, better with Sertraline, HTN, controlled on Metoprolol and Imdur, constipation, stable on MirLax daily, GERD, stable while on Famotidine 20mg , FTT supportive measurs.    Past Medical History  Diagnosis Date  . Hypertension   . DJD (degenerative joint disease)   . Hyperlipidemia   . Coronary atherosclerosis of native coronary artery   . Adjustment disorder with depressed mood   . Dementia in conditions classified elsewhere without behavioral disturbance   . Right bundle branch block   . Asthma   . GERD (gastroesophageal reflux disease)   . Unspecified constipation   . Pain in joint, pelvic region and thigh     left  . Osteoporosis   . Unspecified urinary incontinence   . Anemia   . Depression   . Edema   . Urinary tract  infection, site not specified   . History of fall   . Constipation   . Insomnia    Past Surgical History  Procedure Laterality Date  . Femur im nail  06/19/2011    Procedure: INTRAMEDULLARY (IM) NAIL FEMORAL;  Surgeon: Harvie Junior, MD;  Location: [facility] OR;  Service: Orthopedics;  Laterality: Left;  . Abdominal hysterectomy    . Cholecystectomy    . Skin cancer excision  02/15/2013    right forehead Dr. Irene Limbo    Allergies  Allergen Reactions  . Alendronate Sodium     Per mar  . Asmanex 120 Metered Doses [Mometasone Furoate]   . Budesonide-Formoterol Fumarate     Per mar  . Fluticasone Propionate (Inhal)     Per mar  . Mometasone Furoate     Per mar  . Omeprazole     Per mar  . Valium [Diazepam]       Medication List       This list is accurate as of: 08/30/15 11:22 AM.  Always use your most recent med list.               acetaminophen 650 MG CR tablet  Commonly known as:  TYLENOL  Take 650 mg by mouth. Take one every 6 hours as needed for pain     aspirin 81 MG chewable tablet  Chew 81 mg by mouth daily.     atorvastatin 10 MG  tablet  Commonly known as:  LIPITOR  Take 10 mg by mouth every evening.     calcium-vitamin D 500-200 MG-UNIT tablet  Commonly known as:  OSCAL WITH D  Take 1 tablet by mouth 2 (two) times daily.     CERTAVITE SENIOR/ANTIOXIDANT PO  Take 1 tablet by mouth daily.     famotidine 20 MG tablet  Commonly known as:  PEPCID  Take 20 mg by mouth at bedtime.     feeding supplement Liqd  Take 1 Container by mouth 3 (three) times daily between meals.     isosorbide mononitrate 60 MG 24 hr tablet  Commonly known as:  IMDUR  Take 30 mg by mouth daily.     memantine 10 MG tablet  Commonly known as:  NAMENDA  Take 10 mg by mouth 2 (two) times daily.     metoprolol tartrate 25 MG tablet  Commonly known as:  LOPRESSOR  Take 25 mg by mouth daily.     polyethylene glycol packet  Commonly known as:  MIRALAX / GLYCOLAX  Take 17 g by  mouth daily.     sertraline 25 MG tablet  Commonly known as:  ZOLOFT  Take 25 mg by mouth daily.        Review of Systems  Constitutional: Negative for fever and diaphoresis.  HENT: Negative.  Negative for congestion and hearing loss.   Eyes: Negative.  Negative for pain and discharge.  Respiratory: Negative.  Negative for cough, shortness of breath and wheezing.   Cardiovascular: Negative for chest pain, palpitations and leg swelling.  Gastrointestinal: Negative.  Negative for vomiting, abdominal pain, diarrhea and constipation.  Genitourinary: Positive for urgency. Negative for dysuria and frequency.  Musculoskeletal: Positive for back pain. Negative for myalgias and neck pain.  Skin: Negative for rash.  Neurological: Negative for seizures and headaches.       Dementia. 09/10/12 MMSE; total 19/30   Psychiatric/Behavioral: Negative for hallucinations. The patient is not nervous/anxious.     Immunization History  Administered Date(s) Administered  . Influenza Whole 02/24/2012, 02/23/2013  . Influenza-Unspecified 03/04/2014, 01/08/2015  . PPD Test 06/12/2014  . Pneumococcal Polysaccharide-23 04/20/2008  . Td 04/21/2003   Pertinent  Health Maintenance Due  Topic Date Due  . DEXA SCAN  06/26/1991  . PNA vac Low Risk Adult (2 of 2 - PCV13) 04/20/2009  . INFLUENZA VACCINE  11/19/2015   Fall Risk  03/11/2015  Falls in the past year? No  Risk for fall due to : Impaired balance/gait   Functional Status Survey:    Filed Vitals:   08/30/15 0851  BP: 124/62  Pulse: 68  Temp: 97.2 F (36.2 C)  TempSrc: Oral  Resp: 20  Height:  (1.575 m)  Weight: 117 lb 12.8 oz (53.434 kg)   Body mass index is 21.54 kg/(m^2). Physical Exam  Constitutional: She appears well-developed and well-nourished.  HENT:  Head: Normocephalic and atraumatic.  Eyes: EOM are normal. Pupils are equal, round, and reactive to light. Right eye exhibits no discharge. Left eye exhibits no discharge.    Neck: Normal range of motion. Neck supple. No JVD present. No thyromegaly present.  Cardiovascular: Normal rate and regular rhythm.   No murmur heard. Pulmonary/Chest: No respiratory distress. She has no wheezes. She has no rales.  Abdominal: Soft. Bowel sounds are normal. She exhibits no distension. There is no tenderness.  Musculoskeletal: Normal range of motion. She exhibits no edema.  Lymphadenopathy:    She has no cervical adenopathy.  Neurological: She is alert. She has normal reflexes. No cranial nerve deficit. She exhibits normal muscle tone. Coordination normal.  Skin: Skin is warm and dry. No rash noted. No erythema.  Healed site of excisional biopsy of skin cancer of right forehead. 1.25 inch scar.  Psychiatric:  persisted confusion, pacing, restlessness, not sleep much at night     Labs reviewed:  Recent Labs  01/03/15 05/28/15  NA 144 142  K 4.2 4.1  BUN 22* 23*  CREATININE 1.0 0.8    Recent Labs  01/03/15 05/28/15  AST 24 18  ALT 17 8  ALKPHOS 101 75    Recent Labs  01/03/15 05/28/15  WBC 7.1 7.0  HGB 12.7 12.2  HCT 38 37  PLT 176 177   Lab Results  Component Value Date   TSH 1.90 05/28/2015   No results found for: HGBA1C No results found for: CHOL, HDL, LDLCALC, LDLDIRECT, TRIG, CHOLHDL  Significant Diagnostic Results in last 30 days:  No results found.  Assessment/Plan  Adult failure to thrive Continue to decline physically, cognitively, weight loss, ADL dependency. Continue to supportive care. CBC, CMP, TSH 05/28/15 unremarkable. Treated UTI has limited efficacy in improving mood or physical health. Continue worsening mood, agitation, anxiety, attempting leaving facility, continue to stabilize mood with Sertraline.     Constipation Stable. Continue MiraLax daily.   Coronary atherosclerosis of native coronary artery No c/o angina since last visited. Continue Isosorbide 30mg  daily, Atorvastatin 10mg , ASA 81mg    Dementia due to medical  condition without behavioral disturbance 09/2013 MMSE 10/30 04/24/14 MMSE 8/30 Progressing, continue Namenda for now   Depression due to dementia 07/16/14 crying episodes, poor appetite, lethargy-labs unremarkable-repeat UA C/S and starts Sertraline 25mg  daily. Off Mirtazapine.  Improved mood, anxiety, agitation, and attempts to leave facility, continue Sertraline    Essential hypertension, benign Controlled, continue Metoprolol 25mg  daily. 05/28/15 Na 142, K 4.1, Bun 23, creat 0.83   GERD (gastroesophageal reflux disease) Stable. Continue Famotidine 20mg  daily.     Family/ staff Communication: continue SNF for care needs  Labs/tests ordered:  none

## 2015-08-30 NOTE — Assessment & Plan Note (Signed)
No c/o angina since last visited. Continue Isosorbide 30mg daily, Atorvastatin 10mg, ASA 81mg 

## 2015-08-30 NOTE — Assessment & Plan Note (Signed)
07/16/14 crying episodes, poor appetite, lethargy-labs unremarkable-repeat UA C/S and starts Sertraline 25mg daily. Off Mirtazapine.  Improved mood, anxiety, agitation, and attempts to leave facility, continue Sertraline    

## 2015-08-30 NOTE — Assessment & Plan Note (Signed)
Controlled, continue Metoprolol 25mg  daily. 05/28/15 Na 142, K 4.1, Bun 23, creat 0.83

## 2015-09-30 ENCOUNTER — Encounter: Payer: Self-pay | Admitting: Nurse Practitioner

## 2015-09-30 DIAGNOSIS — S82892A Other fracture of left lower leg, initial encounter for closed fracture: Secondary | ICD-10-CM | POA: Insufficient documentation

## 2015-10-07 ENCOUNTER — Encounter: Payer: Self-pay | Admitting: Nurse Practitioner

## 2015-10-07 ENCOUNTER — Non-Acute Institutional Stay (SKILLED_NURSING_FACILITY): Payer: Medicare Other | Admitting: Nurse Practitioner

## 2015-10-07 DIAGNOSIS — I25700 Atherosclerosis of coronary artery bypass graft(s), unspecified, with unstable angina pectoris: Secondary | ICD-10-CM | POA: Diagnosis not present

## 2015-10-07 DIAGNOSIS — S82892A Other fracture of left lower leg, initial encounter for closed fracture: Secondary | ICD-10-CM | POA: Diagnosis not present

## 2015-10-07 DIAGNOSIS — F0390 Unspecified dementia without behavioral disturbance: Secondary | ICD-10-CM | POA: Diagnosis not present

## 2015-10-07 DIAGNOSIS — I10 Essential (primary) hypertension: Secondary | ICD-10-CM | POA: Diagnosis not present

## 2015-10-07 DIAGNOSIS — R627 Adult failure to thrive: Secondary | ICD-10-CM

## 2015-10-07 DIAGNOSIS — S62502S Fracture of unspecified phalanx of left thumb, sequela: Secondary | ICD-10-CM | POA: Diagnosis not present

## 2015-10-07 DIAGNOSIS — K5900 Constipation, unspecified: Secondary | ICD-10-CM

## 2015-10-07 DIAGNOSIS — K219 Gastro-esophageal reflux disease without esophagitis: Secondary | ICD-10-CM | POA: Diagnosis not present

## 2015-10-07 DIAGNOSIS — S62502A Fracture of unspecified phalanx of left thumb, initial encounter for closed fracture: Secondary | ICD-10-CM | POA: Insufficient documentation

## 2015-10-07 NOTE — Progress Notes (Signed)
Patient ID: Joann Baker, female   DOB: 1925-04-02, 82 y.o.   MRN: [id number]  Location:   [facility] Room Number: 100 Place of Service:   SNF Provider: [facility] Arantza Darrington NP  Murray Hodgkins, MD  Patient Care Team: Kimber Relic, MD as PCP - General (Internal Medicine) [facility] Johnney Ou, NP as Nurse Practitioner (Nurse Practitioner)  Extended Emergency Contact Information Primary Emergency Contact: [facility] Address: [address]          [name],  [address] Home Phone: [phone] Relation: None  Code Status:  DNR Goals of care: Advanced Directive information Advanced Directives 10/07/2015  Does patient have an advance directive? Yes  Type of Advance Directive Living will;Out of facility DNR (pink MOST or yellow form)  Does patient want to make changes to advanced directive? No - Patient declined  Copy of advanced directive(s) in chart? Yes     Chief Complaint  Patient presents with  . Acute Visit    fall(09/29/15) left ankle fx, now left wrist fx.    HPI:  Pt is a 82 y.o. female seen today for medical management of chronic diseases.  Hx of dementia, resides in [facility], taking Namenda for memory,  depression, better with Sertraline, HTN, controlled on Metoprolol and Imdur, constipation, stable on MirLax daily, GERD, stable while on Famotidine , FTT supportive measurs.    Left ankle and left thumb fxs, s/p Ortho, immobilizers for care.    Past Medical History  Diagnosis Date  . Hypertension   . DJD (degenerative joint disease)   . Hyperlipidemia   . Coronary atherosclerosis of native coronary artery   . Adjustment disorder with depressed mood   . Dementia in conditions classified elsewhere without behavioral disturbance   . Right bundle branch block   . Asthma   . GERD (gastroesophageal reflux disease)   . Unspecified constipation   . Pain in joint, pelvic region and thigh     left  . Osteoporosis   . Unspecified urinary incontinence    . Anemia   . Depression   . Edema   . Urinary tract infection, site not specified   . History of fall   . Constipation   . Insomnia    Past Surgical History  Procedure Laterality Date  . Femur im nail  06/19/2011    Procedure: INTRAMEDULLARY (IM) NAIL FEMORAL;  Surgeon: Harvie Junior, MD;  Location: [facility] OR;  Service: Orthopedics;  Laterality: Left;  . Abdominal hysterectomy    . Cholecystectomy    . Skin cancer excision  02/15/2013    right forehead Dr. Irene Limbo    Allergies  Allergen Reactions  . Alendronate Sodium     Per mar  . Asmanex 120 Metered Doses [Mometasone Furoate]   . Budesonide-Formoterol Fumarate     Per mar  . Fluticasone Propionate (Inhal)     Per mar  . Mometasone Furoate     Per mar  . Omeprazole     Per mar  . Valium [Diazepam]       Medication List       This list is accurate as of: 10/07/15 11:59 PM.  Always use your most recent med list.               acetaminophen 325 MG tablet  Commonly known as:  TYLENOL  Take 650 mg by mouth every 6 (six) hours as needed for mild pain or moderate pain.     aspirin  81 MG chewable tablet  Chew 81 mg by mouth daily.     atorvastatin 10 MG tablet  Commonly known as:  LIPITOR  Take 10 mg by mouth every evening.     calcium-vitamin D 500-200 MG-UNIT tablet  Commonly known as:  OSCAL WITH D  Take 1 tablet by mouth 2 (two) times daily.     CERTAVITE SENIOR/ANTIOXIDANT PO  Take 1 tablet by mouth daily.     famotidine 20 MG tablet  Commonly known as:  PEPCID  Take 20 mg by mouth at bedtime.     feeding supplement Liqd  Take 1 Container by mouth 3 (three) times daily between meals.     isosorbide mononitrate 30 MG 24 hr tablet  Commonly known as:  IMDUR  Take 30 mg by mouth daily.     memantine 10 MG tablet  Commonly known as:  NAMENDA  Take 10 mg by mouth 2 (two) times daily.     metoprolol tartrate 25 MG tablet  Commonly known as:  LOPRESSOR  Take 25 mg by mouth daily.     polyethylene  glycol packet  Commonly known as:  MIRALAX / GLYCOLAX  Take 17 g by mouth daily.     sertraline 50 MG tablet  Commonly known as:  ZOLOFT  Take 50 mg by mouth daily.        Review of Systems  Constitutional: Negative for fever and diaphoresis.  HENT: Negative.  Negative for congestion and hearing loss.   Eyes: Negative.  Negative for pain and discharge.  Respiratory: Negative.  Negative for cough, shortness of breath and wheezing.   Cardiovascular: Negative for chest pain, palpitations and leg swelling.  Gastrointestinal: Negative.  Negative for vomiting, abdominal pain, diarrhea and constipation.  Genitourinary: Positive for urgency. Negative for dysuria and frequency.  Musculoskeletal: Positive for back pain. Negative for myalgias and neck pain.       Left thumb and left ankle fxs, s/p Ortho, immobilizers.   Skin: Negative for rash.  Neurological: Negative for seizures and headaches.       Dementia. 09/10/12 MMSE; total 19/30   Psychiatric/Behavioral: Negative for hallucinations. The patient is not nervous/anxious.     Immunization History  Administered Date(s) Administered  . Influenza Whole 02/24/2012, 02/23/2013  . Influenza-Unspecified 03/04/2014, 01/08/2015  . PPD Test 06/12/2014  . Pneumococcal Polysaccharide-23 04/20/2008  . Td 04/21/2003   Pertinent  Health Maintenance Due  Topic Date Due  . DEXA SCAN  06/26/1991  . PNA vac Low Risk Adult (2 of 2 - PCV13) 04/20/2009  . INFLUENZA VACCINE  11/19/2015   Fall Risk  03/11/2015  Falls in the past year? No  Risk for fall due to : Impaired balance/gait   Functional Status Survey:    Filed Vitals:   10/07/15 0932  BP: 128/70  Pulse: 74  Temp: 97 F (36.1 C)  TempSrc: Tympanic  Resp: 20  Height: 5\' 2"  (1.575 m)  Weight: 123 lb (55.792 kg)   Body mass index is 22.49 kg/(m^2). Physical Exam  Constitutional: She appears well-developed and well-nourished.  HENT:  Head: Normocephalic and atraumatic.  Eyes:  EOM are normal. Pupils are equal, round, and reactive to light. Right eye exhibits no discharge. Left eye exhibits no discharge.  Neck: Normal range of motion. Neck supple. No JVD present. No thyromegaly present.  Cardiovascular: Normal rate and regular rhythm.   No murmur heard. Pulmonary/Chest: No respiratory distress. She has no wheezes. She has no rales.  Abdominal: Soft. Bowel  sounds are normal. She exhibits no distension. There is no tenderness.  Musculoskeletal: Normal range of motion. She exhibits tenderness. She exhibits no edema.  Left thumb and left ankle fxs, s/p Ortho, immobilizers.   Lymphadenopathy:    She has no cervical adenopathy.  Neurological: She is alert. She has normal reflexes. No cranial nerve deficit. She exhibits normal muscle tone. Coordination normal.  Skin: Skin is warm and dry. No rash noted. No erythema.  Healed site of excisional biopsy of skin cancer of right forehead. 1.25 inch scar.  Psychiatric:  persisted confusion, pacing, restlessness, not sleep much at night     Labs reviewed:  Recent Labs  01/03/15 05/28/15  NA 144 142  K 4.2 4.1  BUN 22* 23*  CREATININE 1.0 0.8    Recent Labs  01/03/15 05/28/15  AST 24 18  ALT 17 8  ALKPHOS 101 75    Recent Labs  01/03/15 05/28/15  WBC 7.1 7.0  HGB 12.7 12.2  HCT 38 37  PLT 176 177   Lab Results  Component Value Date   TSH 1.90 05/28/2015   No results found for: HGBA1C No results found for: CHOL, HDL, LDLCALC, LDLDIRECT, TRIG, CHOLHDL  Significant Diagnostic Results in last 30 days:  No results found.  Assessment/Plan  Essential hypertension, benign Controlled, continue Metoprolol 25mg  daily. 05/28/15 Na 142, K 4.1, Bun 23, creat 0.83, update CMP  CAD (coronary artery disease) of artery bypass graft No c/o angina since last visited. Continue Isosorbide 30mg  daily, Atorvastatin 10mg , ASA 81mg    GERD (gastroesophageal reflux disease) Stable. Continue Famotidine 20mg   daily.  Constipation Stable. Continue MiraLax daily.   Dementia 09/2013 MMSE 10/30 04/24/14 MMSE 8/30 Progressing, continue Namenda for now, Lorazepam prn for agitation, update CBC, CMP, TSH  Ankle fracture, left 09/30/15 X-ray left ankle/foot: acute fractures lateral and posterior malleoli. Ortho   Closed fracture of left thumb 10/04/15 X-ray acute intra-articular fracture proximal phalanx thumb., 10/05/15 Ortho thumb immobilization   Adult failure to thrive Continue to decline physically, cognitively, weight loss, ADL dependency. Continue to supportive care. CBC, CMP, TSH 05/28/15 unremarkable. Treated UTI has limited efficacy in improving mood or physical health. Continue worsening mood, agitation, anxiety, attempting leaving facility, continue to stabilize mood with Sertraline.      Family/ staff Communication: continue SNF for care needs  Labs/tests ordered: CBC, CMP, TSH

## 2015-10-07 NOTE — Assessment & Plan Note (Signed)
Stable. Continue Famotidine 20mg daily.  

## 2015-10-07 NOTE — Assessment & Plan Note (Signed)
10/04/15 X-ray acute intra-articular fracture proximal phalanx thumb., 10/05/15 Ortho thumb immobilization  

## 2015-10-07 NOTE — Assessment & Plan Note (Signed)
Continue to decline physically, cognitively, weight loss, ADL dependency. Continue to supportive care. CBC, CMP, TSH 05/28/15 unremarkable. Treated UTI has limited efficacy in improving mood or physical health. Continue worsening mood, agitation, anxiety, attempting leaving facility, continue to stabilize mood with Sertraline.

## 2015-10-07 NOTE — Assessment & Plan Note (Signed)
09/30/15 X-ray left ankle/foot: acute fractures lateral and posterior malleoli. Ortho

## 2015-10-07 NOTE — Assessment & Plan Note (Signed)
Stable. Continue MiraLax daily.  

## 2015-10-07 NOTE — Assessment & Plan Note (Signed)
Controlled, continue Metoprolol 25mg  daily. 05/28/15 Na 142, K 4.1, Bun 23, creat 0.83, update CMP

## 2015-10-07 NOTE — Assessment & Plan Note (Signed)
No c/o angina since last visited. Continue Isosorbide 30mg daily, Atorvastatin 10mg, ASA 81mg 

## 2015-10-07 NOTE — Assessment & Plan Note (Addendum)
09/2013 MMSE 10/30 04/24/14 MMSE 8/30 Progressing, continue Namenda for now, Lorazepam prn for agitation, update CBC, CMP, TSH

## 2015-10-08 LAB — BASIC METABOLIC PANEL
BUN: 30 mg/dL — AB (ref 4–21)
CREATININE: 1.1 mg/dL (ref <=1.1)
Glucose: 69 mg/dL
Potassium: 4.2 mmol/L (ref 3.4–5.3)
SODIUM: 142 mmol/L (ref 137–147)

## 2015-10-08 LAB — CBC AND DIFFERENTIAL
HCT: 34 % — AB (ref 36–46)
Hemoglobin: 11.4 g/dL — AB (ref 12.0–16.0)
PLATELETS: 229 10*3/uL (ref 150–399)
WBC: 9.2 10^3/mL

## 2015-10-08 LAB — TSH: TSH: 1.46 u[IU]/mL (ref <=5.90)

## 2015-10-08 LAB — HEPATIC FUNCTION PANEL
ALK PHOS: 106 U/L (ref 25–125)
ALT: 23 U/L (ref 7–35)
AST: 28 U/L (ref 13–35)
Bilirubin, Total: 0.7 mg/dL

## 2015-11-29 ENCOUNTER — Emergency Department (HOSPITAL_COMMUNITY): Payer: Medicare Other

## 2015-11-29 ENCOUNTER — Emergency Department (HOSPITAL_COMMUNITY)
Admission: EM | Admit: 2015-11-29 | Discharge: 2015-11-29 | Disposition: A | Discharge status: Home / Self Care | Payer: Medicare Other | Attending: Emergency Medicine | Admitting: Emergency Medicine

## 2015-11-29 ENCOUNTER — Encounter (HOSPITAL_COMMUNITY): Payer: Self-pay | Admitting: Emergency Medicine

## 2015-11-29 DIAGNOSIS — Y999 Unspecified external cause status: Secondary | ICD-10-CM | POA: Diagnosis not present

## 2015-11-29 DIAGNOSIS — M25551 Pain in right hip: Secondary | ICD-10-CM | POA: Insufficient documentation

## 2015-11-29 DIAGNOSIS — J45909 Unspecified asthma, uncomplicated: Secondary | ICD-10-CM | POA: Diagnosis not present

## 2015-11-29 DIAGNOSIS — Z79899 Other long term (current) drug therapy: Secondary | ICD-10-CM | POA: Insufficient documentation

## 2015-11-29 DIAGNOSIS — W19XXXA Unspecified fall, initial encounter: Secondary | ICD-10-CM | POA: Diagnosis not present

## 2015-11-29 DIAGNOSIS — Y9289 Other specified places as the place of occurrence of the external cause: Secondary | ICD-10-CM | POA: Diagnosis not present

## 2015-11-29 DIAGNOSIS — I10 Essential (primary) hypertension: Secondary | ICD-10-CM | POA: Diagnosis not present

## 2015-11-29 DIAGNOSIS — R002 Palpitations: Secondary | ICD-10-CM | POA: Diagnosis not present

## 2015-11-29 DIAGNOSIS — I2510 Atherosclerotic heart disease of native coronary artery without angina pectoris: Secondary | ICD-10-CM | POA: Insufficient documentation

## 2015-11-29 DIAGNOSIS — M25552 Pain in left hip: Secondary | ICD-10-CM | POA: Diagnosis not present

## 2015-11-29 DIAGNOSIS — Z7982 Long term (current) use of aspirin: Secondary | ICD-10-CM | POA: Insufficient documentation

## 2015-11-29 DIAGNOSIS — Y939 Activity, unspecified: Secondary | ICD-10-CM | POA: Diagnosis not present

## 2015-11-29 NOTE — Discharge Instructions (Signed)
Please follow-up with Dr. Luiz BlareGraves for reevaluation and further management of ankle fracture.

## 2015-11-29 NOTE — ED Provider Notes (Signed)
[facility]-EMERGENCY DEPT Provider Note   CSN: [id number] Arrival date & time: 11/29/15  1825  First Provider Contact:  None       History   Chief Complaint Chief Complaint  Patient presents with  . Fall  . Bilateral hip pain    HPI CAMELIA STELZNER is a 82 y.o. female.  HPI   Level 5 caveat due to dementia  Per EMS patient had a mechanical fall after standing. A consult at the nursing facility and was able to speak with a registered nurse who gave detailed account. She reports that patient was sitting on the sofa and sit up, nursing staff wanted her to sit back down, she stumbled at a mechanical fall and landed on the ground. Patient did not lose consciousness, was acting were purely, they had her sit forward but she noticed right hip pain at that time. They had patient remain on the ground EMS was called. Patient has had a decline in her physical status status post left ankle fracture, left thumb fracture. She sees Dr. Luiz Blare who released her on 11/26/2015 for weightbearing as tolerated. Since then she has had a abnormal gait, and they report she is unsafe to walk on her own although she frequently attempts 2. Exam no acute concerns other than evaluation for the hip pain. Patient is not on blood thinners, takes daily aspirin.   Past Medical History:  Diagnosis Date  . Adjustment disorder with depressed mood   . Anemia   . Asthma   . Constipation   . Coronary atherosclerosis of native coronary artery   . Dementia in conditions classified elsewhere without behavioral disturbance   . Depression   . DJD (degenerative joint disease)   . Edema   . GERD (gastroesophageal reflux disease)   . History of fall   . Hyperlipidemia   . Hypertension   . Insomnia   . Osteoporosis   . Pain in joint, pelvic region and thigh    left  . Right bundle branch block   . Unspecified constipation   . Unspecified urinary incontinence   . Urinary tract infection, site not specified     Patient  Active Problem List   Diagnosis Date Noted  . Closed fracture of left thumb 10/07/2015  . Ankle fracture, left 09/30/2015  . Adult failure to thrive 02/18/2015  . Loss of weight 10/15/2014  . Depression due to dementia 06/20/2014  . Seborrheic dermatitis, unspecified 12/14/2013  . Neuropathy of both upper extremities (HCC) 12/14/2013  . Dementia due to medical condition without behavioral disturbance   . DJD (degenerative joint disease)   . Coronary atherosclerosis of native coronary artery   . History of fall   . Constipation   . Dyslipidemia 03/02/2013  . Skin lesion of scalp 01/02/2013  . GERD (gastroesophageal reflux disease) 01/02/2013  . Infection of urinary tract 08/03/2012  . Insomnia 08/03/2012  . CAD (coronary artery disease) of artery bypass graft 08/03/2012  . Osteoarthritis of left hip 07/14/2012  . Lumbosacral spondylosis without myelopathy 07/14/2012  . Essential hypertension, benign 07/14/2012  . Urinary incontinence due to cognitive impairment 06/23/2011  . Dementia 06/23/2011  . Femur fracture, left (HCC) 06/19/2011    Past Surgical History:  Procedure Laterality Date  . ABDOMINAL HYSTERECTOMY    . CHOLECYSTECTOMY    . FEMUR IM NAIL  06/19/2011   Procedure: INTRAMEDULLARY (IM) NAIL FEMORAL;  Surgeon: Harvie Junior, MD;  Location: [facility] OR;  Service: Orthopedics;  Laterality: Left;  . SKIN  CANCER EXCISION  02/15/2013   right forehead Dr. Irene Limbo    OB History    No data available       Home Medications    Prior to Admission medications   Medication Sig Start Date End Date Taking? Authorizing Provider  acetaminophen (TYLENOL) 325 MG tablet Take 650 mg by mouth every 6 (six) hours as needed for mild pain or moderate pain.   Yes Historical Provider, MD  aspirin 81 MG chewable tablet Chew 81 mg by mouth daily.   Yes Historical Provider, MD  atorvastatin (LIPITOR) 10 MG tablet Take 10 mg by mouth every evening.   Yes Historical Provider, MD  calcium-vitamin  D (OSCAL WITH D) 500-200 MG-UNIT per tablet Take 1 tablet by mouth 2 (two) times daily. 08/27/14  Yes Man X Mast, NP  famotidine (PEPCID) 20 MG tablet Take 20 mg by mouth at bedtime.   Yes Historical Provider, MD  feeding supplement (BOOST / RESOURCE BREEZE) LIQD Take 1 Container by mouth 3 (three) times daily between meals.   Yes Historical Provider, MD  isosorbide mononitrate (IMDUR) 30 MG 24 hr tablet Take 30 mg by mouth daily.   Yes Historical Provider, MD  LORazepam (ATIVAN) 0.5 MG tablet Take 0.5 mg by mouth every 4 (four) hours as needed for anxiety.   Yes Historical Provider, MD  memantine (NAMENDA) 10 MG tablet Take 10 mg by mouth 2 (two) times daily.   Yes Historical Provider, MD  metoprolol succinate (TOPROL-XL) 25 MG 24 hr tablet Take 25 mg by mouth at bedtime.   Yes Historical Provider, MD  Multiple Vitamins-Minerals (CERTAVITE SENIOR/ANTIOXIDANT PO) Take 1 tablet by mouth daily.   Yes Historical Provider, MD  polyethylene glycol (MIRALAX / GLYCOLAX) packet Take 17 g by mouth daily.   Yes Historical Provider, MD  sertraline (ZOLOFT) 50 MG tablet Take 50 mg by mouth daily.   Yes Historical Provider, MD    Family History No family history on file.  Social History Social History  Substance Use Topics  . Smoking status: Never Smoker  . Smokeless tobacco: Never Used  . Alcohol use No     Allergies   Alendronate sodium; Asmanex 120 metered doses [mometasone furoate]; Budesonide-formoterol fumarate; Fluticasone propionate (inhal); Mometasone furoate; Omeprazole; and Valium [diazepam]   Review of Systems Review of Systems  All other systems reviewed and are negative.    Physical Exam Updated Vital Signs BP (!) 135/52 (BP Location: Right Arm)   Pulse (!) 55   Temp 98.4 F (36.9 C) (Oral)   Resp 16   SpO2 99%   Physical Exam  Constitutional: She is oriented to person, place, and time. She appears well-developed and well-nourished.  HENT:  Head: Normocephalic and  atraumatic.  Eyes: Conjunctivae are normal. Pupils are equal, round, and reactive to light. Right eye exhibits no discharge. Left eye exhibits no discharge. No scleral icterus.  Neck: Normal range of motion. No JVD present. No tracheal deviation present.  Pulmonary/Chest: Effort normal. No stridor.  Chest nontender, normal lung sounds normal expansion  Abdominal: Soft. She exhibits no distension.  Musculoskeletal:  No CT or L-spine tenderness, no tenderness to palpation of the back. Patient complains of bilateral hip pain with compression, second attempt at palpation with no pain. Hips are stable, no pain with internal/external rotation of the femur, no pain with flexion. Distal extremities H rheumatic nontender to palpation, no swelling or edema. Upper extremities atraumatic nontender.  Neurological: She is alert and oriented to person, place, and  time. She has normal strength. No cranial nerve deficit or sensory deficit. Coordination normal. GCS eye subscore is 4. GCS verbal subscore is 5. GCS motor subscore is 6.  Psychiatric: She has a normal mood and affect. Her behavior is normal. Judgment and thought content normal.  Nursing note and vitals reviewed.    ED Treatments / Results  Labs (all labs ordered are listed, but only abnormal results are displayed) Labs Reviewed - No data to display  EKG  EKG Interpretation None       Radiology Dg Ankle Complete Left  Result Date: 11/29/2015 CLINICAL DATA:  Left ankle pain after falling from a standing position onto carpeted floor today. Recent ankle fracture. EXAM: LEFT ANKLE COMPLETE - 3+ VIEW COMPARISON:  None. FINDINGS: Mildly comminuted lateral malleolus fracture with mild lateral displacement of the distal fragment. There is a suggestion of some associated periosteal new bone. There is also a posterior malleolus fracture with 2 mm of proximal displacement of the posterior fragment. There is a small avulsion fracture of the distal medial  malleolus. Mild diffuse soft tissue swelling. Calcaneal spurs. Small ossific or calcific density proximal to the inferior aspect of the cuboid. IMPRESSION: 1. Try malleolar fracture, as described above. 2. Small calcification or avulsion fracture fragment proximal to the inferior cuboid. Electronically Signed   By: Beckie SaltsSteven  Reid M.D.   On: 11/29/2015 19:56   Dg Hips Bilat W Or Wo Pelvis 3-4 Views  Result Date: 11/29/2015 CLINICAL DATA:  Bilateral hip pain after falling from a standing position onto a carpeted floor. EXAM: DG HIP (WITH OR WITHOUT PELVIS) 3-4V BILAT COMPARISON:  None. FINDINGS: Mild to moderate right hip degenerative changes and mild left hip degenerative changes. Focal periosteal thickening in the lateral cortex of the mid right femur, compatible with a healed or healing area of stress reaction or stress fracture. Rod and screw fixation of an old, healed left mid femoral shaft fracture. No acute fracture or dislocation seen. Mild lower lumbar spine degenerative changes and mild scoliosis. Atheromatous arterial calcifications, including the abdominal aorta. IMPRESSION: 1. No fracture or dislocation. 2. Chronic changes, as described above. 3. Aortic atherosclerosis. Electronically Signed   By: Beckie SaltsSteven  Reid M.D.   On: 11/29/2015 19:52    Procedures Procedures (including critical care time)  Medications Ordered in ED Medications - No data to display   Initial Impression / Assessment and Plan / ED Course  I have reviewed the triage vital signs and the nursing notes.  Pertinent labs & imaging results that were available during my care of the patient were reviewed by me and considered in my medical decision making (see chart for details).  Clinical Course    Final Clinical Impressions(s) / ED Diagnoses   Final diagnoses:  Fall, initial encounter    Labs:  Imaging: DG hips bilateral pelvis, DG ankle complete left  Consults:  Therapeutics:  Discharge Meds:    Assessment/Plan: 82 year old female presents today status post fall. Initially she reported hip pain, this was inconsistent. Plain films of her hip showed no significant abnormalities. Patient has a history of ankle fracture, again on x-ray she has significant ankle derangement. She is nontender to palpation of her ankles. Patient is unsafe to ambulate on her own, she was recently released to weightbearing but nursing staff concerned that this is not safe. Patient will be nonweightbearing pending follow-up with Dr. Luiz BlareGraves. I spoke with patient's daughter at bedside, and spoke with nursing staff the facility. Patient has no other acute findings  necessitate further evaluation and management here in the ED.        New Prescriptions Discharge Medication List as of 11/29/2015  8:55 PM       Eyvonne Mechanic, PA-C 11/30/15 2130    Rolland Porter, MD 12/03/15 1145

## 2015-11-29 NOTE — ED Triage Notes (Signed)
[facility] complaints bilateral hip pain after standing from wheelchair and fell forward onto a carpeted floor.  Recent ankle fracture.  Hx dementia. EMS states patient walking on fractured ankle-baseline confusion with dementia-no blood thinners. CBG 116 BP 172/69 HR 60

## 2015-11-29 NOTE — ED Notes (Signed)
Pt returned from X Ray.

## 2015-11-29 NOTE — ED Triage Notes (Signed)
Patient from [facility] is a DNR

## 2015-11-29 NOTE — ED Notes (Signed)
Pt transported to XRay 

## 2015-11-29 NOTE — ED Notes (Signed)
Bed: RESA Expected date:  Expected time:  Means of arrival:  Comments: 87F/unwitnessed fall/bila hip pain

## 2015-11-29 NOTE — ED Notes (Signed)
PA at bedside.

## 2015-12-02 ENCOUNTER — Non-Acute Institutional Stay (SKILLED_NURSING_FACILITY): Payer: Medicare Other | Admitting: Nurse Practitioner

## 2015-12-02 ENCOUNTER — Encounter: Payer: Self-pay | Admitting: Nurse Practitioner

## 2015-12-02 DIAGNOSIS — F329 Major depressive disorder, single episode, unspecified: Secondary | ICD-10-CM | POA: Diagnosis not present

## 2015-12-02 DIAGNOSIS — F0390 Unspecified dementia without behavioral disturbance: Secondary | ICD-10-CM

## 2015-12-02 DIAGNOSIS — K219 Gastro-esophageal reflux disease without esophagitis: Secondary | ICD-10-CM | POA: Diagnosis not present

## 2015-12-02 DIAGNOSIS — I10 Essential (primary) hypertension: Secondary | ICD-10-CM

## 2015-12-02 DIAGNOSIS — S82892A Other fracture of left lower leg, initial encounter for closed fracture: Secondary | ICD-10-CM | POA: Diagnosis not present

## 2015-12-02 DIAGNOSIS — F0280 Dementia in other diseases classified elsewhere without behavioral disturbance: Secondary | ICD-10-CM

## 2015-12-02 DIAGNOSIS — F0393 Unspecified dementia, unspecified severity, with mood disturbance: Secondary | ICD-10-CM

## 2015-12-02 DIAGNOSIS — K5900 Constipation, unspecified: Secondary | ICD-10-CM

## 2015-12-02 NOTE — Progress Notes (Signed)
[facility]   [facility] Room Number: [phone] Place of Service:  ALF (13) Provider:  Chipper OmanMast, Manxie  NP   Patient Care Team: Kimber RelicArthur G Green, MD as PCP - General (Internal Medicine) [facility] Johnney OuX Mast, NP as Nurse Practitioner (Nurse Practitioner)  Extended Emergency Contact Information Primary Emergency Contact[facility] Address: [address]          [place]  [id number] Home Phone: [phone] Relation: None  Code Status:  DNR Goals of care: Advanced Directive information Advanced Directives 12/02/2015  Does patient have an advance directive? Yes  Type of Advance Directive Living will;Out of facility DNR (pink MOST or yellow form)  Does patient want to make changes to advanced directive? No - Patient declined  Copy of advanced directive(s) in chart? Yes  Pre-existing out of facility DNR order (yellow form or pink MOST form) -     Chief Complaint  Patient presents with  . Hospitalization Follow-up    Joann Baker on 8/11, sent to hosp. returned same day around 10 pm.    HPI:  Pt is a 82 y.o. female seen today for an acute visit for ED eval 11/29/15 for [facility]. S/p left ankle fx, left thumb fx, saw Dr. Luiz BlareGraves 11/26/15, WBAT, worsened gait since the fxs. X-ray There is a small avulsion fracture of the distal medial malleolus.  Hx of dementia, [facility], taking Namenda for memory,  depression, better with Sertraline, HTN, controlled on Metoprolol and Imdur, constipation, stable on MirLax daily, GERD, stable while on Famotidine 20mg , FTT supportive measurs.    Past Medical History:  Diagnosis Date  . Adjustment disorder with depressed mood   . Anemia   . Asthma   . Constipation   . Coronary atherosclerosis of native coronary artery   . Dementia in conditions classified elsewhere without behavioral disturbance   . Depression   . DJD (degenerative joint disease)   . Edema   . GERD (gastroesophageal reflux disease)     . History of fall   . Hyperlipidemia   . Hypertension   . Insomnia   . Osteoporosis   . Pain in joint, pelvic region and thigh    left  . Right bundle branch block   . Unspecified constipation   . Unspecified urinary incontinence   . Urinary tract infection, site not specified    Past Surgical History:  Procedure Laterality Date  . ABDOMINAL HYSTERECTOMY    . CHOLECYSTECTOMY    . FEMUR IM NAIL  06/19/2011   Procedure: INTRAMEDULLARY (IM) NAIL FEMORAL;  Surgeon: Harvie JuniorJohn L Graves, MD;  Location: [facility] OR;  Service: Orthopedics;  Laterality: Left;  . SKIN CANCER EXCISION  02/15/2013   right forehead Dr. Irene LimboGoodrich    Allergies  Allergen Reactions  . Alendronate Sodium Other (See Comments)    Reaction:  Unknown   . Asmanex 120 Metered Doses [Mometasone Furoate] Other (See Comments)    Reaction:  Unknown   . Budesonide-Formoterol Fumarate Other (See Comments)    Reaction:  Unknown   . Fluticasone Propionate (Inhal) Other (See Comments)    Reaction:  Unknown   . Mometasone Furoate Other (See Comments)    Reaction:  Unknown   . Omeprazole Other (See Comments)    Reaction:  Unknown   . Valium [Diazepam] Other (See Comments)    Reaction:  Unknown       Medication List       Accurate as of 12/02/15  11:59 PM. Always use your most recent med list.          acetaminophen 325 MG tablet Commonly known as:  TYLENOL Take 650 mg by mouth every 6 (six) hours as needed for mild pain or moderate pain.   aspirin 81 MG chewable tablet Chew 81 mg by mouth daily.   atorvastatin 10 MG tablet Commonly known as:  LIPITOR Take 10 mg by mouth every evening.   calcium-vitamin D 500-200 MG-UNIT tablet Commonly known as:  OSCAL WITH D Take 1 tablet by mouth 2 (two) times daily.   CERTAVITE SENIOR/ANTIOXIDANT PO Take 1 tablet by mouth daily.   famotidine 20 MG tablet Commonly known as:  PEPCID Take 20 mg by mouth at bedtime.   feeding supplement Liqd Take 1 Container by mouth 3 (three)  times daily between meals.   isosorbide mononitrate 30 MG 24 hr tablet Commonly known as:  IMDUR Take 30 mg by mouth daily.   LORazepam 0.5 MG tablet Commonly known as:  ATIVAN Take 0.5 mg by mouth every 4 (four) hours as needed for anxiety.   memantine 10 MG tablet Commonly known as:  NAMENDA Take 10 mg by mouth 2 (two) times daily.   metoprolol succinate 25 MG 24 hr tablet Commonly known as:  TOPROL-XL Take 25 mg by mouth at bedtime.   polyethylene glycol packet Commonly known as:  MIRALAX / GLYCOLAX Take 17 g by mouth daily.   sertraline 50 MG tablet Commonly known as:  ZOLOFT Take 50 mg by mouth daily.       Review of Systems  Constitutional: Negative for diaphoresis and fever.  HENT: Negative.  Negative for congestion and hearing loss.   Eyes: Negative.  Negative for pain and discharge.  Respiratory: Negative.  Negative for cough, shortness of breath and wheezing.   Cardiovascular: Negative for chest pain, palpitations and leg swelling.  Gastrointestinal: Negative.  Negative for abdominal pain, constipation, diarrhea and vomiting.  Genitourinary: Positive for urgency. Negative for dysuria and frequency.  Musculoskeletal: Positive for back pain. Negative for myalgias and neck pain.       S/p Left thumb and left ankle fxs, s/p Ortho, WBAT  Skin: Negative for rash.  Neurological: Negative for seizures and headaches.       Dementia. 09/10/12 MMSE; total 19/30   Psychiatric/Behavioral: Negative for hallucinations. The patient is not nervous/anxious.     Immunization History  Administered Date(s) Administered  . Influenza Whole 02/24/2012, 02/23/2013  . Influenza-Unspecified 03/04/2014, 01/08/2015  . PPD Test 06/12/2014  . Pneumococcal Polysaccharide-23 04/20/2008  . Td 04/21/2003   Pertinent  Health Maintenance Due  Topic Date Due  . DEXA SCAN  06/26/1991  . PNA vac Low Risk Adult (2 of 2 - PCV13) 04/20/2009  . INFLUENZA VACCINE  11/19/2015   Fall Risk   03/11/2015  Falls in the past year? No  Risk for fall due to : Impaired balance/gait   Functional Status Survey:    Vitals:   12/02/15 1011  BP: 118/74  Pulse: 70  Resp: 18  Temp: 97 F (36.1 C)  Weight: 112 lb (50.8 kg)  Height: 5\' 4"  (1.626 m)   Body mass index is 19.22 kg/m. Physical Exam  Constitutional: She appears well-developed and well-nourished.  HENT:  Head: Normocephalic and atraumatic.  Eyes: EOM are normal. Pupils are equal, round, and reactive to light. Right eye exhibits no discharge. Left eye exhibits no discharge.  Neck: Normal range of motion. Neck supple. No JVD present. No  thyromegaly present.  Cardiovascular: Normal rate and regular rhythm.   No murmur heard. Pulmonary/Chest: No respiratory distress. She has no wheezes. She has no rales.  Abdominal: Soft. Bowel sounds are normal. She exhibits no distension. There is no tenderness.  Musculoskeletal: Normal range of motion. She exhibits tenderness. She exhibits no edema.  S/p Left thumb and left ankle fxs, s/p Ortho  Lymphadenopathy:    She has no cervical adenopathy.  Neurological: She is alert. She has normal reflexes. No cranial nerve deficit. She exhibits normal muscle tone. Coordination normal.  Skin: Skin is warm and dry. No rash noted. No erythema.  Healed site of excisional biopsy of skin cancer of right forehead. 1.25 inch scar.  Psychiatric:  persisted confusion, pacing, restlessness, not sleep much at night     Labs reviewed:  Recent Labs  01/03/15 05/28/15 10/08/15  NA 144 142 142  K 4.2 4.1 4.2  BUN 22* 23* 30*  CREATININE 1.0 0.8 1.1    Recent Labs  01/03/15 05/28/15 10/08/15  AST 24 18 28   ALT 17 8 23   ALKPHOS 101 75 106    Recent Labs  01/03/15 05/28/15 10/08/15  WBC 7.1 7.0 9.2  HGB 12.7 12.2 11.4*  HCT 38 37 34*  PLT 176 177 229   Lab Results  Component Value Date   TSH 1.46 10/08/2015   No results found for: HGBA1C No results found for: CHOL, HDL, LDLCALC,  LDLDIRECT, TRIG, CHOLHDL  Significant Diagnostic Results in last 30 days:  Dg Ankle Complete Left  Result Date: 11/29/2015 CLINICAL DATA:  Left ankle pain after falling from a standing position onto carpeted floor today. Recent ankle fracture. EXAM: LEFT ANKLE COMPLETE - 3+ VIEW COMPARISON:  None. FINDINGS: Mildly comminuted lateral malleolus fracture with mild lateral displacement of the distal fragment. There is a suggestion of some associated periosteal new bone. There is also a posterior malleolus fracture with 2 mm of proximal displacement of the posterior fragment. There is a small avulsion fracture of the distal medial malleolus. Mild diffuse soft tissue swelling. Calcaneal spurs. Small ossific or calcific density proximal to the inferior aspect of the cuboid. IMPRESSION: 1. Try malleolar fracture, as described above. 2. Small calcification or avulsion fracture fragment proximal to the inferior cuboid. Electronically Signed   By: Beckie SaltsSteven  Reid M.D.   On: 11/29/2015 19:56   Dg Hips Bilat W Or Wo Pelvis 3-4 Views  Result Date: 11/29/2015 CLINICAL DATA:  Bilateral hip pain after falling from a standing position onto a carpeted floor. EXAM: DG HIP (WITH OR WITHOUT PELVIS) 3-4V BILAT COMPARISON:  None. FINDINGS: Mild to moderate right hip degenerative changes and mild left hip degenerative changes. Focal periosteal thickening in the lateral cortex of the mid right femur, compatible with a healed or healing area of stress reaction or stress fracture. Rod and screw fixation of an old, healed left mid femoral shaft fracture. No acute fracture or dislocation seen. Mild lower lumbar spine degenerative changes and mild scoliosis. Atheromatous arterial calcifications, including the abdominal aorta. IMPRESSION: 1. No fracture or dislocation. 2. Chronic changes, as described above. 3. Aortic atherosclerosis. Electronically Signed   By: Beckie SaltsSteven  Reid M.D.   On: 11/29/2015 19:52    Assessment/Plan There are no  diagnoses linked to this encounter.Essential hypertension, benign Controlled, continue Metoprolol 25mg  daily. 05/28/15 Na 142, K 4.1, Bun 23, creat 0.83, update CMP, CBC, UA C/S, TSH   CAD (coronary artery disease) of artery bypass graft No c/o angina since last visited. Continue  Isosorbide 30mg  daily, Atorvastatin 10mg , ASA 81mg     GERD (gastroesophageal reflux disease) Stable. Continue Famotidine 20mg  daily.   Constipation Stable. Continue MiraLax daily.   Dementia 09/2013 MMSE 10/30 04/24/14 MMSE 8/30 Progressing, continue Namenda for now, Lorazepam prn for agitation, update CBC, CMP, TSH Lack of safety awareness and increased frailty/gait problem contributory to her falling  Depression due to dementia 07/16/14 crying episodes, poor appetite, lethargy-labs unremarkable-repeat UA C/S and starts Sertraline 25mg  daily. Off Mirtazapine.  Improved mood, anxiety, agitation, and attempts to leave facility, continue Sertraline     Ankle fracture, left WBAT, ambulate with [place] with [facility]BA     Family/ staff Communication: continue SNF Memory Care Unit for care assistance.   Labs/tests ordered:  CMP, CBC, UA C/S, TSH

## 2015-12-02 NOTE — Assessment & Plan Note (Signed)
Stable. Continue MiraLax daily.  

## 2015-12-02 NOTE — Assessment & Plan Note (Signed)
WBAT, ambulate with [place] with SBA

## 2015-12-02 NOTE — Assessment & Plan Note (Signed)
Controlled, continue Metoprolol 25mg  daily. 05/28/15 Na 142, K 4.1, Bun 23, creat 0.83, update CMP, CBC, UA C/S, TSH

## 2015-12-02 NOTE — Assessment & Plan Note (Signed)
09/2013 MMSE 10/30 04/24/14 MMSE 8/30 Progressing, continue Namenda for now, Lorazepam prn for agitation, update CBC, CMP, TSH Lack of safety awareness and increased frailty/gait problem contributory to her falling

## 2015-12-02 NOTE — Assessment & Plan Note (Signed)
07/16/14 crying episodes, poor appetite, lethargy-labs unremarkable-repeat UA C/S and starts Sertraline 25mg  daily. Off Mirtazapine.  Improved mood, anxiety, agitation, and attempts to leave facility, continue Sertraline

## 2015-12-02 NOTE — Assessment & Plan Note (Signed)
Stable. Continue Famotidine 20mg daily.  

## 2015-12-02 NOTE — Assessment & Plan Note (Signed)
No c/o angina since last visited. Continue Isosorbide 30mg daily, Atorvastatin 10mg, ASA 81mg 

## 2015-12-03 LAB — BASIC METABOLIC PANEL
BUN: 30 mg/dL — AB (ref 4–21)
CREATININE: 1 mg/dL (ref <=1.1)
GLUCOSE: 85 mg/dL
POTASSIUM: 4.4 mmol/L (ref 3.4–5.3)
SODIUM: 145 mmol/L (ref 137–147)

## 2015-12-03 LAB — HEPATIC FUNCTION PANEL
ALT: 11 U/L (ref 7–35)
AST: 20 U/L (ref 13–35)
Alkaline Phosphatase: 69 U/L (ref 25–125)
Bilirubin, Total: 0.3 mg/dL

## 2015-12-03 LAB — CBC AND DIFFERENTIAL
HCT: 33 % — AB (ref 36–46)
HEMOGLOBIN: 11.1 g/dL — AB (ref 12.0–16.0)
Platelets: 199 10*3/uL (ref 150–399)
WBC: 7.8 10*3/mL

## 2015-12-03 LAB — TSH: TSH: 2.58 u[IU]/mL (ref <=5.90)

## 2015-12-06 ENCOUNTER — Other Ambulatory Visit: Payer: Self-pay | Admitting: *Deleted

## 2015-12-09 ENCOUNTER — Encounter (HOSPITAL_COMMUNITY): Payer: Self-pay | Admitting: Emergency Medicine

## 2015-12-09 ENCOUNTER — Emergency Department (HOSPITAL_COMMUNITY)
Admission: EM | Admit: 2015-12-09 | Discharge: 2015-12-10 | Disposition: A | Discharge status: Home / Self Care | Payer: Medicare Other | Attending: Emergency Medicine | Admitting: Emergency Medicine

## 2015-12-09 ENCOUNTER — Encounter: Payer: Self-pay | Admitting: Internal Medicine

## 2015-12-09 ENCOUNTER — Non-Acute Institutional Stay (SKILLED_NURSING_FACILITY): Payer: Medicare Other | Admitting: Internal Medicine

## 2015-12-09 ENCOUNTER — Emergency Department (HOSPITAL_COMMUNITY): Payer: Medicare Other

## 2015-12-09 DIAGNOSIS — R627 Adult failure to thrive: Secondary | ICD-10-CM | POA: Diagnosis not present

## 2015-12-09 DIAGNOSIS — F329 Major depressive disorder, single episode, unspecified: Secondary | ICD-10-CM | POA: Diagnosis not present

## 2015-12-09 DIAGNOSIS — Y999 Unspecified external cause status: Secondary | ICD-10-CM | POA: Diagnosis not present

## 2015-12-09 DIAGNOSIS — Z7982 Long term (current) use of aspirin: Secondary | ICD-10-CM | POA: Diagnosis not present

## 2015-12-09 DIAGNOSIS — I10 Essential (primary) hypertension: Secondary | ICD-10-CM | POA: Insufficient documentation

## 2015-12-09 DIAGNOSIS — W1839XA Other fall on same level, initial encounter: Secondary | ICD-10-CM | POA: Insufficient documentation

## 2015-12-09 DIAGNOSIS — F0393 Unspecified dementia, unspecified severity, with mood disturbance: Secondary | ICD-10-CM

## 2015-12-09 DIAGNOSIS — S52501A Unspecified fracture of the lower end of right radius, initial encounter for closed fracture: Secondary | ICD-10-CM

## 2015-12-09 DIAGNOSIS — S52601A Unspecified fracture of lower end of right ulna, initial encounter for closed fracture: Secondary | ICD-10-CM | POA: Diagnosis not present

## 2015-12-09 DIAGNOSIS — F0280 Dementia in other diseases classified elsewhere without behavioral disturbance: Secondary | ICD-10-CM | POA: Diagnosis not present

## 2015-12-09 DIAGNOSIS — Z79899 Other long term (current) drug therapy: Secondary | ICD-10-CM | POA: Insufficient documentation

## 2015-12-09 DIAGNOSIS — I2510 Atherosclerotic heart disease of native coronary artery without angina pectoris: Secondary | ICD-10-CM | POA: Diagnosis not present

## 2015-12-09 DIAGNOSIS — W19XXXA Unspecified fall, initial encounter: Secondary | ICD-10-CM

## 2015-12-09 DIAGNOSIS — F0390 Unspecified dementia without behavioral disturbance: Secondary | ICD-10-CM

## 2015-12-09 DIAGNOSIS — J45909 Unspecified asthma, uncomplicated: Secondary | ICD-10-CM | POA: Diagnosis not present

## 2015-12-09 DIAGNOSIS — Y939 Activity, unspecified: Secondary | ICD-10-CM | POA: Insufficient documentation

## 2015-12-09 DIAGNOSIS — S6991XA Unspecified injury of right wrist, hand and finger(s), initial encounter: Secondary | ICD-10-CM | POA: Diagnosis present

## 2015-12-09 DIAGNOSIS — Y92009 Unspecified place in unspecified non-institutional (private) residence as the place of occurrence of the external cause: Secondary | ICD-10-CM | POA: Diagnosis not present

## 2015-12-09 MED ORDER — SERTRALINE HCL 100 MG PO TABS
ORAL_TABLET | ORAL | 5 refills | Status: DC
Start: 2015-12-09 — End: 2015-12-12

## 2015-12-09 NOTE — Progress Notes (Signed)
Progress Note    Location:   [facility] Room Number: [id number] Place of Service:  SNF [phone](31) Provider:  Murray HodgkinsArthur Green, MD  Patient Care Team: Kimber RelicArthur G Green, MD as PCP - General (Internal Medicine) [facility] Johnney OuX Mast, NP as Nurse Practitioner (Nurse Practitioner)  Extended Emergency Contact Information Primary Emergency Contact: [facility] Address: [address]          [place],  [id number] Home Phone: [phone] Relation: None  Code Status:  DNR Goals of care: Advanced Directive information Advanced Directives 12/09/2015  Does patient have an advance directive? Yes  Type of Advance Directive Living will;Out of facility DNR (pink MOST or yellow form)  Does patient want to make changes to advanced directive? -  Copy of advanced directive(s) in chart? Yes  Pre-existing out of facility DNR order (yellow form or pink MOST form) -     Chief Complaint  Patient presents with  . Medical Management of Chronic Issues    routine medication management    HPI:  Pt is a 82 y.o. female seen today for medical management of chronic diseases.    Patient was last seen by Chipper OmanManxie Mast, NP on 12/02/2015 in follow-up from emergency department visit on 11/29/2015. Patient had a fall. There was a small avulsion fracture of the distal medial malleolus. Patient was already up walking on it.  She is seen today in acute follow-up as well as chronic management of her medical problems.  Dementia, without behavioral disturbance - continues to lose memory  Adult failure to thrive - poor appetite and insufficient caloric intake of lead to weight loss. Weight of 123 pounds in January 2017 has fallen to 112 pounds on 11/20/2015.  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 Currently medicated with sertraline 50 mg daily. Will Increase the  dose to 100 mg today.   Past Medical History:  Diagnosis Date  . Adjustment disorder with depressed mood   . Anemia   . Asthma   . Constipation   . Coronary atherosclerosis of native coronary artery   . Dementia in conditions classified elsewhere without behavioral disturbance   . Depression   . DJD (degenerative joint disease)   . Edema   . GERD (gastroesophageal reflux disease)   . History of fall   . Hyperlipidemia   . Hypertension   . Insomnia   . Osteoporosis   . Pain in joint, pelvic region and thigh    left  . Right bundle branch block   . Unspecified constipation   . Unspecified urinary incontinence   . Urinary tract infection, site not specified    Past Surgical History:  Procedure Laterality Date  . ABDOMINAL HYSTERECTOMY    . CHOLECYSTECTOMY    . FEMUR IM NAIL  06/19/2011   Procedure: INTRAMEDULLARY (IM) NAIL FEMORAL;  Surgeon: Harvie JuniorJohn L Graves, MD;  Location: [facility] OR;  Service: Orthopedics;  Laterality: Left;  . SKIN CANCER EXCISION  02/15/2013   right forehead Dr. Irene LimboGoodrich    Allergies  Allergen Reactions  . Alendronate Sodium Other (See Comments)    Reaction:  Unknown   . Asmanex 120 Metered Doses [Mometasone Furoate] Other (See Comments)    Reaction:  Unknown   . Budesonide-Formoterol Fumarate Other (See Comments)    Reaction:  Unknown   . Fluticasone Propionate (Inhal) Other (See Comments)    Reaction:  Unknown   .  Mometasone Furoate Other (See Comments)    Reaction:  Unknown   . Omeprazole Other (See Comments)    Reaction:  Unknown   . Valium [Diazepam] Other (See Comments)    Reaction:  Unknown       Medication List       Accurate as of 12/09/15 12:19 PM. Always use your most recent med list.          acetaminophen 325 MG tablet Commonly known as:  TYLENOL Take 650 mg by mouth every 6 (six) hours as needed for mild pain or moderate pain.   aspirin 81 MG chewable tablet Chew 81 mg by mouth daily.   atorvastatin 10 MG tablet Commonly  known as:  LIPITOR Take 10 mg by mouth every evening.   calcium-vitamin D 500-200 MG-UNIT tablet Commonly known as:  OSCAL WITH D Take 1 tablet by mouth 2 (two) times daily.   CERTAVITE SENIOR/ANTIOXIDANT PO Take 1 tablet by mouth daily.   famotidine 20 MG tablet Commonly known as:  PEPCID Take 20 mg by mouth at bedtime.   feeding supplement Liqd Take 1 Container by mouth 3 (three) times daily between meals.   isosorbide mononitrate 30 MG 24 hr tablet Commonly known as:  IMDUR Take 30 mg by mouth daily.   LORazepam 0.5 MG tablet Commonly known as:  ATIVAN Take 0.5 mg by mouth every 4 (four) hours as needed for anxiety.   memantine 10 MG tablet Commonly known as:  NAMENDA Take 10 mg by mouth 2 (two) times daily.   metoprolol succinate 25 MG 24 hr tablet Commonly known as:  TOPROL-XL Take 25 mg by mouth at bedtime.   polyethylene glycol packet Commonly known as:  MIRALAX / GLYCOLAX Take 17 g by mouth daily.   sertraline 50 MG tablet Commonly known as:  ZOLOFT Take 50 mg by mouth daily.       Review of Systems  Constitutional: Negative for diaphoresis and fever.  HENT: Negative for congestion and hearing loss.   Eyes: Negative for pain and discharge.  Respiratory: Negative for cough, shortness of breath and wheezing.   Cardiovascular: Negative for chest pain, palpitations and leg swelling.  Gastrointestinal: Negative for abdominal pain, constipation, diarrhea and vomiting.  Genitourinary: Positive for urgency. Negative for dysuria and frequency.  Musculoskeletal: Positive for back pain. Negative for myalgias and neck pain.       S/p Left thumb and left ankle fxs, s/p Ortho, WBAT  Skin: Negative for rash.  Neurological: Negative for seizures and headaches.       Dementia. 09/10/12 MMSE; total 19/30   Psychiatric/Behavioral: Positive for confusion, decreased concentration and dysphoric mood. Negative for hallucinations and suicidal ideas. The patient is  nervous/anxious.     Immunization History  Administered Date(s) Administered  . Influenza Whole 02/24/2012, 02/23/2013  . Influenza-Unspecified 03/04/2014, 01/08/2015  . PPD Test 06/12/2014  . Pneumococcal Polysaccharide-23 04/20/2008  . Td 04/21/2003   Pertinent  Health Maintenance Due  Topic Date Due  . DEXA SCAN  06/26/1991  . PNA vac Low Risk Adult (2 of 2 - PCV13) 04/20/2009  . INFLUENZA VACCINE  11/19/2015   Fall Risk  03/11/2015  Falls in the past year? No  Risk for fall due to : Impaired balance/gait     Vitals:   12/09/15 1205  BP: 128/74  Pulse: 72  Resp: 18  Temp: 98.6 F (37 C)  Weight: 112 lb (50.8 kg)  Height: 5\' 2"  (1.575 m)   Body mass index  is 20.49 kg/m. Physical Exam  Constitutional: She appears well-developed and well-nourished.  HENT:  Head: Normocephalic and atraumatic.  Eyes: EOM are normal. Pupils are equal, round, and reactive to light. Right eye exhibits no discharge. Left eye exhibits no discharge.  Neck: Normal range of motion. Neck supple. No JVD present. No thyromegaly present.  Cardiovascular: Normal rate and regular rhythm.   No murmur heard. Pulmonary/Chest: No respiratory distress. She has no wheezes. She has no rales.  Abdominal: Soft. Bowel sounds are normal. She exhibits no distension. There is no tenderness.  Musculoskeletal: Normal range of motion. She exhibits tenderness. She exhibits no edema.  S/p Left thumb and left ankle fxs, s/p Ortho  Lymphadenopathy:    She has no cervical adenopathy.  Neurological: She is alert. She has normal reflexes. No cranial nerve deficit. She exhibits normal muscle tone. Coordination normal.  Skin: Skin is warm and dry. No rash noted. No erythema.  Healed site of excisional biopsy of skin cancer of right forehead. 1.25 inch scar.  Psychiatric:  persisted confusion, pacing, restlessness, not sleep much at night     Labs reviewed:  Recent Labs  05/28/15 10/08/15 12/03/15  NA 142 142 145    K 4.1 4.2 4.4  BUN 23* 30* 30*  CREATININE 0.8 1.1 1.0    Recent Labs  05/28/15 10/08/15 12/03/15  AST 18 28 20   ALT 8 23 11   ALKPHOS 75 106 69    Recent Labs  05/28/15 10/08/15 12/03/15  WBC 7.0 9.2 7.8  HGB 12.2 11.4* 11.1*  HCT 37 34* 33*  PLT 177 229 199   Lab Results  Component Value Date   TSH 2.58 12/03/2015   No results found for: HGBA1C No results found for: CHOL, HDL, LDLCALC, LDLDIRECT, TRIG, CHOLHDL  Significant Diagnostic Results in last 30 days:  Dg Ankle Complete Left  Result Date: 11/29/2015 CLINICAL DATA:  Left ankle pain after falling from a standing position onto carpeted floor today. Recent ankle fracture. EXAM: LEFT ANKLE COMPLETE - 3+ VIEW COMPARISON:  None. FINDINGS: Mildly comminuted lateral malleolus fracture with mild lateral displacement of the distal fragment. There is a suggestion of some associated periosteal new bone. There is also a posterior malleolus fracture with 2 mm of proximal displacement of the posterior fragment. There is a small avulsion fracture of the distal medial malleolus. Mild diffuse soft tissue swelling. Calcaneal spurs. Small ossific or calcific density proximal to the inferior aspect of the cuboid. IMPRESSION: 1. Try malleolar fracture, as described above. 2. Small calcification or avulsion fracture fragment proximal to the inferior cuboid. Electronically Signed   By: Beckie Salts M.D.   On: 11/29/2015 19:56   Dg Hips Bilat W Or Wo Pelvis 3-4 Views  Result Date: 11/29/2015 CLINICAL DATA:  Bilateral hip pain after falling from a standing position onto a carpeted floor. EXAM: DG HIP (WITH OR WITHOUT PELVIS) 3-4V BILAT COMPARISON:  None. FINDINGS: Mild to moderate right hip degenerative changes and mild left hip degenerative changes. Focal periosteal thickening in the lateral cortex of the mid right femur, compatible with a healed or healing area of stress reaction or stress fracture. Rod and screw fixation of an old, healed left mid  femoral shaft fracture. No acute fracture or dislocation seen. Mild lower lumbar spine degenerative changes and mild scoliosis. Atheromatous arterial calcifications, including the abdominal aorta. IMPRESSION: 1. No fracture or dislocation. 2. Chronic changes, as described above. 3. Aortic atherosclerosis. Electronically Signed   By: Zada Finders.D.  On: 11/29/2015 19:52    Assessment/Plan  1. Dementia, without behavioral disturbance worsening  2. Adult failure to thrive Poor intake  3. Essential hypertension, benign controlled  4. Depression due to dementia -Seertralinre 100 mg QAM

## 2015-12-09 NOTE — ED Notes (Signed)
[facility] Expected date:  Expected time:  Means of arrival:  Comments: Fall, wrist deformity

## 2015-12-09 NOTE — ED Provider Notes (Signed)
[facility]-EMERGENCY DEPT Provider Note   CSN: [id number] Arrival date & time: 12/09/15  2006     History   Chief Complaint Chief Complaint  Patient presents with  . Wrist Injury    HPI Level 5 Caveat: dementia. Joann Baker is a 82 y.o. female resident of [facility] with a history of dementia. She is ambulatory only with assistance. She had a witnessed mechanical fall just PTA. She fell backwards onto her right wrist. There is a deformity of that wrist was associated pain. Pain is worse with palpation or attempt movement. ROM of right wrist is limited to due pain and swelling.   HPI  Past Medical History:  Diagnosis Date  . Adjustment disorder with depressed mood   . Anemia   . Asthma   . Constipation   . Coronary atherosclerosis of native coronary artery   . Dementia in conditions classified elsewhere without behavioral disturbance   . Depression   . DJD (degenerative joint disease)   . Edema   . GERD (gastroesophageal reflux disease)   . History of fall   . Hyperlipidemia   . Hypertension   . Insomnia   . Osteoporosis   . Pain in joint, pelvic region and thigh    left  . Right bundle branch block   . Unspecified constipation   . Unspecified urinary incontinence   . Urinary tract infection, site not specified     Patient Active Problem List   Diagnosis Date Noted  . Closed fracture of left thumb 10/07/2015  . Ankle fracture, left 09/30/2015  . Adult failure to thrive 02/18/2015  . Loss of weight 10/15/2014  . Depression due to dementia 06/20/2014  . Seborrheic dermatitis, unspecified 12/14/2013  . Neuropathy of both upper extremities (HCC) 12/14/2013  . Dementia due to medical condition without behavioral disturbance   . DJD (degenerative joint disease)   . Coronary atherosclerosis of native coronary artery   . History of fall   . Constipation   . Dyslipidemia 03/02/2013  . Skin lesion of scalp 01/02/2013  . GERD (gastroesophageal reflux disease)  01/02/2013  . Infection of urinary tract 08/03/2012  . Insomnia 08/03/2012  . CAD (coronary artery disease) of artery bypass graft 08/03/2012  . Osteoarthritis of left hip 07/14/2012  . Lumbosacral spondylosis without myelopathy 07/14/2012  . Essential hypertension, benign 07/14/2012  . Urinary incontinence due to cognitive impairment 06/23/2011  . Dementia 06/23/2011  . Femur fracture, left (HCC) 06/19/2011    Past Surgical History:  Procedure Laterality Date  . ABDOMINAL HYSTERECTOMY    . CHOLECYSTECTOMY    . FEMUR IM NAIL  06/19/2011   Procedure: INTRAMEDULLARY (IM) NAIL FEMORAL;  Surgeon: Harvie JuniorJohn L Graves, MD;  Location: [facility] OR;  Service: Orthopedics;  Laterality: Left;  . SKIN CANCER EXCISION  02/15/2013   right forehead Dr. Irene LimboGoodrich    OB History    No data available       Home Medications    Prior to Admission medications   Medication Sig Start Date End Date Taking? Authorizing Provider  acetaminophen (TYLENOL) 325 MG tablet Take 650 mg by mouth every 6 (six) hours as needed for mild pain or moderate pain.    Historical Provider, MD  aspirin 81 MG chewable tablet Chew 81 mg by mouth daily.    Historical Provider, MD  atorvastatin (LIPITOR) 10 MG tablet Take 10 mg by mouth every evening.    Historical Provider, MD  calcium-vitamin D (OSCAL WITH D) 500-200 MG-UNIT per tablet Take  1 tablet by mouth 2 (two) times daily. 08/27/14   Man X Mast, NP  famotidine (PEPCID) 20 MG tablet Take 20 mg by mouth at bedtime.    Historical Provider, MD  feeding supplement (BOOST / RESOURCE BREEZE) LIQD Take 1 Container by mouth 3 (three) times daily between meals.    Historical Provider, MD  isosorbide mononitrate (IMDUR) 30 MG 24 hr tablet Take 30 mg by mouth daily.    Historical Provider, MD  LORazepam (ATIVAN) 0.5 MG tablet Take 0.5 mg by mouth every 4 (four) hours as needed for anxiety.    Historical Provider, MD  memantine (NAMENDA) 10 MG tablet Take 10 mg by mouth 2 (two) times daily.     Historical Provider, MD  metoprolol succinate (TOPROL-XL) 25 MG 24 hr tablet Take 25 mg by mouth at bedtime.    Historical Provider, MD  Multiple Vitamins-Minerals (CERTAVITE SENIOR/ANTIOXIDANT PO) Take 1 tablet by mouth daily.    Historical Provider, MD  polyethylene glycol (MIRALAX / GLYCOLAX) packet Take 17 g by mouth daily.    Historical Provider, MD  sertraline (ZOLOFT) 100 MG tablet 1 nightly for depression and anxiety 12/09/15   Kimber RelicArthur G Green, MD    Family History No family history on file.  Social History Social History  Substance Use Topics  . Smoking status: Never Smoker  . Smokeless tobacco: Never Used  . Alcohol use No     Allergies   Alendronate sodium; Asmanex 120 metered doses [mometasone furoate]; Budesonide-formoterol fumarate; Fluticasone propionate (inhal); Mometasone furoate; Omeprazole; and Valium [diazepam]   Review of Systems Review of Systems  Unable to perform ROS: Dementia     Physical Exam Updated Vital Signs BP 141/63 (BP Location: Left Arm)   Pulse (!) 52   Temp 97.6 F (36.4 C) (Oral)   Resp 18   SpO2 99%   Physical Exam General: Well-developed, well-nourished female in no acute distress; appearance consistent with age of record HENT: normocephalic; atraumatic Eyes: normal apppearance Neck: supple; nontender Heart: regular rate and rhythm; no murmurs, rubs or gallops Lungs: clear to auscultation bilaterally Abdomen: soft; nondistended; nontender; bowel sounds present Extremities: deformity and tenderness of right wrist; right hand distally neurovascularly intact, though exam limited due to patient's dementia; left ankle and foot in orthotic boot Neurologic: Awake, alert; motor function intact in all extremities and symmetric; no facial droop Skin: Warm and dry Psychiatric: Normal mood and affect    ED Treatments / Results  Nursing notes and vitals signs, including pulse oximetry, reviewed.  Summary of this visit's results, reviewed  by myself:  Imaging Studies: Dg Wrist Complete Right  Result Date: 12/09/2015 CLINICAL DATA:  Fall, right wrist pain/deformity EXAM: RIGHT WRIST - COMPLETE 3+ VIEW COMPARISON:  None. FINDINGS: Mildly displaced distal radial fracture with apex volar angulation, comminuted. Mildly displaced distal ulnar fracture, likely with apex volar angulation. Associated moderate soft tissue swelling. IMPRESSION: Mildly displaced distal radial and ulnar fractures, as above. Electronically Signed   By: Charline BillsSriyesh  Krishnan M.D.   On: 12/09/2015 20:58    9:15 PM Discussed with Dr. Melvyn Novasrtmann who will see the patient in the office. He recommends a short arm splint.  Procedures (including critical care time)  Final Clinical Impressions(s) / ED Diagnoses   Final diagnoses:  Distal radius fracture, right, closed, initial encounter  Right distal ulnar fracture, closed, initial encounter  Fall at home, initial encounter      Paula LibraJohn Eligh Rybacki, MD 12/09/15 2117

## 2015-12-09 NOTE — ED Notes (Signed)
[facility] called for transport at 2145

## 2015-12-09 NOTE — ED Triage Notes (Signed)
Patient presents from [facility] via ems for witnessed fall. Fall to right wrist, obvious deformity. Denies hitting head, no LOC. History of dementia.   Last VS: 150/90, 60hr

## 2015-12-10 NOTE — ED Notes (Signed)
[facility] [employer], [facility] is en route

## 2015-12-10 NOTE — ED Notes (Signed)
[facility] arrived for patient transport.  

## 2015-12-12 ENCOUNTER — Non-Acute Institutional Stay (SKILLED_NURSING_FACILITY): Payer: Medicare Other | Admitting: Nurse Practitioner

## 2015-12-12 ENCOUNTER — Encounter: Payer: Self-pay | Admitting: Nurse Practitioner

## 2015-12-12 DIAGNOSIS — R627 Adult failure to thrive: Secondary | ICD-10-CM | POA: Diagnosis not present

## 2015-12-12 DIAGNOSIS — S62502S Fracture of unspecified phalanx of left thumb, sequela: Secondary | ICD-10-CM | POA: Diagnosis not present

## 2015-12-12 DIAGNOSIS — F329 Major depressive disorder, single episode, unspecified: Secondary | ICD-10-CM | POA: Diagnosis not present

## 2015-12-12 DIAGNOSIS — S62101A Fracture of unspecified carpal bone, right wrist, initial encounter for closed fracture: Secondary | ICD-10-CM | POA: Insufficient documentation

## 2015-12-12 DIAGNOSIS — S82892A Other fracture of left lower leg, initial encounter for closed fracture: Secondary | ICD-10-CM | POA: Diagnosis not present

## 2015-12-12 DIAGNOSIS — F0390 Unspecified dementia without behavioral disturbance: Secondary | ICD-10-CM | POA: Diagnosis not present

## 2015-12-12 DIAGNOSIS — F0280 Dementia in other diseases classified elsewhere without behavioral disturbance: Secondary | ICD-10-CM | POA: Diagnosis not present

## 2015-12-12 DIAGNOSIS — S62101S Fracture of unspecified carpal bone, right wrist, sequela: Secondary | ICD-10-CM | POA: Diagnosis not present

## 2015-12-12 DIAGNOSIS — I10 Essential (primary) hypertension: Secondary | ICD-10-CM | POA: Diagnosis not present

## 2015-12-12 DIAGNOSIS — I25700 Atherosclerosis of coronary artery bypass graft(s), unspecified, with unstable angina pectoris: Secondary | ICD-10-CM | POA: Diagnosis not present

## 2015-12-12 DIAGNOSIS — K5900 Constipation, unspecified: Secondary | ICD-10-CM

## 2015-12-12 DIAGNOSIS — K219 Gastro-esophageal reflux disease without esophagitis: Secondary | ICD-10-CM | POA: Diagnosis not present

## 2015-12-12 DIAGNOSIS — F0393 Unspecified dementia, unspecified severity, with mood disturbance: Secondary | ICD-10-CM

## 2015-12-12 NOTE — Assessment & Plan Note (Signed)
[facility], ambulate with [place] with [facility] 

## 2015-12-12 NOTE — Assessment & Plan Note (Signed)
Stable. Continue Famotidine 20mg daily.  

## 2015-12-12 NOTE — Assessment & Plan Note (Signed)
07/16/14 crying episodes, poor appetite, lethargy-labs unremarkable-repeat UA C/S and starts Sertraline 25mg daily. Off Mirtazapine.  Improved mood, anxiety, agitation, and attempts to leave facility, continue Sertraline 100mg daily   

## 2015-12-12 NOTE — Assessment & Plan Note (Addendum)
Controlled, continue Metoprolol 25mg  daily. 12/03/15 Hgb 11.1, Na 145, K 4.4, Bun 30, creat 0.96, TP 5.7, albumin 3.3, TSH 2.58

## 2015-12-12 NOTE — Assessment & Plan Note (Addendum)
09/2013 MMSE 10/30 04/24/14 MMSE 8/30 Progressing, continue Namenda for now, Lorazepam prn for agitation, update CBC, CMP, TSH Lack of safety awareness and increased frailty/gait problem contributory to her falling 12/03/15 Hgb 11.1, Na 145, K 4.4, Bun 30, creat 0.96, TP 5.7, albumin 3.3, TSH 2.58      

## 2015-12-12 NOTE — Assessment & Plan Note (Signed)
Stable. Continue MiraLax daily.  

## 2015-12-12 NOTE — Assessment & Plan Note (Signed)
No c/o angina since last visited. Continue Isosorbide 30mg daily, Atorvastatin 10mg, ASA 81mg 

## 2015-12-12 NOTE — Assessment & Plan Note (Addendum)
09/2013 MMSE 10/30 04/24/14 MMSE 8/30 Progressing, continue Namenda for now, Lorazepam prn for agitation, update CBC, CMP, TSH Lack of safety awareness and increased frailty/gait problem contributory to her falling 12/03/15 Hgb 11.1, Na 145, K 4.4, Bun 30, creat 0.96, TP 5.7, albumin 3.3, TSH 2.58

## 2015-12-12 NOTE — Assessment & Plan Note (Signed)
10/04/15 X-ray acute intra-articular fracture proximal phalanx thumb., 10/05/15 Ortho thumb immobilization

## 2015-12-12 NOTE — Assessment & Plan Note (Signed)
Continue to decline physically, cognitively, weight loss, ADL dependency. Continue to supportive care. CBC, CMP, TSH 05/28/15 unremarkable. Treated UTI has limited efficacy in improving mood or physical health. Continue worsening mood, agitation, anxiety, attempting leaving facility, continue to stabilize mood with Sertraline.

## 2015-12-12 NOTE — Assessment & Plan Note (Signed)
12/09/15 X-ray Mildly displaced distal radial and ulnar fractures 8/241/7 s/p ED, s/p Ortho, continue splint, f/u Ortho.

## 2015-12-12 NOTE — Progress Notes (Signed)
Location:   [facility] Room Number: 100 Place of Service:  SNF ([phone]) Provider:  Mast, Manxie  NP  Murray Hodgkins, MD  Patient Care Team: Kimber Relic, MD as PCP - General (Internal Medicine) [facility] Johnney Ou, NP as Nurse Practitioner (Nurse Practitioner)  Extended Emergency Contact Information Primary Emergency Contact: [facility] Address: [address] [place]          [name],  [address] Home Phone: [phone] Relation: None  Code Status:  DNR Goals of care: Advanced Directive information Advanced Directives 12/12/2015  Does patient have an advance directive? Yes  Type of Advance Directive Out of facility DNR (pink MOST or yellow form)  Does patient want to make changes to advanced directive? No - Patient declined  Copy of advanced directive(s) in chart? Yes  Pre-existing out of facility DNR order (yellow form or pink MOST form) -     Chief Complaint  Patient presents with  . Acute Visit    Fx.(Rt) hand and forearm    HPI:  Pt is a 82 y.o. female seen today for an acute visit for s/p right forearm fx, in slpint,  s/p ED and Ortho consult, f/u Ortho, swelling of the right forearm and hand noted, able to make a fist with pain, denied tingling, warmth to touch.    ED 12/09/15 X-ray showed Mildly displaced distal radial and ulnar fractures   S/p left ankle fx, left thumb fx, saw Dr. Luiz Blare 11/26/15, WBAT, worsened gait since the fxs. X-ray There is a small avulsion fracture of the distal medial malleolus.                       Hx of dementia, resides in [facility], taking Namenda for memory, depression, better with Sertraline, HTN, controlled on Metoprolol and Imdur, constipation, stable on MirLax daily, GERD, stable while on Famotidine 20mg , FTT supportive measurs.    Progress Note    Location:   [facility] Room Number: 100 Place of Service:  SNF [phone]) Provider:  Murray Hodgkins, MD  Patient Care Team: Kimber Relic, MD as PCP - General (Internal Medicine) [facility] Johnney Ou, NP as Nurse Practitioner (Nurse Practitioner)  Extended Emergency Contact Information Primary Emergency Contact: [facility] Address: [address] [place]          [name],  [address] Home Phone: [phone] Relation: None  Code Status:  DNR Goals of care: Advanced Directive information Advanced Directives 12/12/2015  Does patient have an advance directive? Yes  Type of Advance Directive Out of facility DNR (pink MOST or yellow form)  Does patient want to make changes to advanced directive? No - Patient declined  Copy of advanced directive(s) in chart? Yes  Pre-existing out of facility DNR order (yellow form or pink MOST form) -     Chief Complaint  Patient presents with  . Acute Visit    Fx.(Rt) hand and forearm    HPI:  Pt is a 82 y.o. female seen today for medical management of chronic diseases.    Patient was last seen by Chipper Oman, NP on 12/02/2015 in follow-up from emergency department visit on 11/29/2015. Patient had a fall. There was a small avulsion fracture of the distal medial malleolus. Patient was already up walking on it.  She is seen today in acute follow-up as well as chronic management of her medical problems.  Dementia, without behavioral disturbance - continues to lose  memory  Adult failure to thrive - poor appetite and insufficient caloric intake of lead to weight loss. Weight of 123 pounds in January 2017 has fallen to 112 pounds on 11/20/2015.  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 Currently medicated with sertraline 50 mg daily. Will Increase the dose to 100 mg today.   Past Medical History:  Diagnosis Date  . Adjustment disorder with depressed mood   . Anemia   . Asthma   . Constipation   . Coronary atherosclerosis of native coronary  artery   . Dementia in conditions classified elsewhere without behavioral disturbance   . Depression   . DJD (degenerative joint disease)   . Edema   . GERD (gastroesophageal reflux disease)   . History of fall   . Hyperlipidemia   . Hypertension   . Insomnia   . Osteoporosis   . Pain in joint, pelvic region and thigh    left  . Right bundle branch block   . Unspecified constipation   . Unspecified urinary incontinence   . Urinary tract infection, site not specified    Past Surgical History:  Procedure Laterality Date  . ABDOMINAL HYSTERECTOMY    . CHOLECYSTECTOMY    . FEMUR IM NAIL  06/19/2011   Procedure: INTRAMEDULLARY (IM) NAIL FEMORAL;  Surgeon: Harvie Junior, MD;  Location: [facility] OR;  Service: Orthopedics;  Laterality: Left;  . SKIN CANCER EXCISION  02/15/2013   right forehead Dr. Irene Limbo    Allergies  Allergen Reactions  . Alendronate Sodium Other (See Comments)    Reaction:  Unknown   . Asmanex 120 Metered Doses [Mometasone Furoate] Other (See Comments)    Reaction:  Unknown   . Budesonide-Formoterol Fumarate Other (See Comments)    Reaction:  Unknown   . Fluticasone Propionate (Inhal) Other (See Comments)    Reaction:  Unknown   . Mometasone Furoate Other (See Comments)    Reaction:  Unknown   . Omeprazole Other (See Comments)    Reaction:  Unknown   . Valium [Diazepam] Other (See Comments)    Reaction:  Unknown       Medication List       Accurate as of 12/12/15  1:22 PM. Always use your most recent med list.          acetaminophen 325 MG tablet Commonly known as:  TYLENOL Take 650 mg by mouth every 6 (six) hours as needed for mild pain or moderate pain.   aspirin 81 MG chewable tablet Chew 81 mg by mouth daily.   atorvastatin 10 MG tablet Commonly known as:  LIPITOR Take 10 mg by mouth every evening.   calcium-vitamin D 500-200 MG-UNIT tablet Commonly known as:  OSCAL WITH D Take 1 tablet by mouth 2 (two) times daily.   CERTAVITE  SENIOR/ANTIOXIDANT PO Take 1 tablet by mouth daily.   famotidine 20 MG tablet Commonly known as:  PEPCID Take 20 mg by mouth at bedtime.   feeding supplement Liqd Take 1 Container by mouth 3 (three) times daily between meals.   isosorbide mononitrate 30 MG 24 hr tablet Commonly known as:  IMDUR Take 30 mg by mouth daily.   LORazepam 0.5 MG tablet Commonly known as:  ATIVAN Take 0.5 mg by mouth every 4 (four) hours as needed for anxiety.   memantine 10 MG tablet Commonly known as:  NAMENDA Take 10 mg by mouth 2 (two)  times daily.   metoprolol succinate 25 MG 24 hr tablet Commonly known as:  TOPROL-XL Take 25 mg by mouth at bedtime.   polyethylene glycol packet Commonly known as:  MIRALAX / GLYCOLAX Take 17 g by mouth daily.   sertraline 100 MG tablet Commonly known as:  ZOLOFT Take 100 mg by mouth every morning.       Review of Systems  Constitutional: Negative for diaphoresis and fever.  HENT: Negative for congestion and hearing loss.   Eyes: Negative for pain and discharge.  Respiratory: Negative for cough, shortness of breath and wheezing.   Cardiovascular: Negative for chest pain, palpitations and leg swelling.  Gastrointestinal: Negative for abdominal pain, constipation, diarrhea and vomiting.  Genitourinary: Positive for urgency. Negative for dysuria and frequency.  Musculoskeletal: Positive for back pain. Negative for myalgias and neck pain.       S/p Left thumb and left ankle fxs, s/p Ortho, WBAT  Skin: Negative for rash.  Neurological: Negative for seizures and headaches.       Dementia. 09/10/12 MMSE; total 19/30   Psychiatric/Behavioral: Positive for confusion, decreased concentration and dysphoric mood. Negative for hallucinations and suicidal ideas. The patient is nervous/anxious.     Immunization History  Administered Date(s) Administered  . Influenza Whole 02/24/2012, 02/23/2013  . Influenza-Unspecified 03/04/2014, 01/08/2015  . PPD Test  06/12/2014  . Pneumococcal Polysaccharide-23 04/20/2008  . Td 04/21/2003   Pertinent  Health Maintenance Due  Topic Date Due  . DEXA SCAN  06/26/1991  . PNA vac Low Risk Adult (2 of 2 - PCV13) 04/20/2009  . INFLUENZA VACCINE  11/19/2015   Fall Risk  03/11/2015  Falls in the past year? No  Risk for fall due to : Impaired balance/gait     Vitals:   12/12/15 1140  BP: 112/76  Pulse: 70  Resp: 20  Temp: 97.2 F (36.2 C)  Weight: 112 lb (50.8 kg)  Height: 5\' 2"  (1.575 m)   Body mass index is 20.49 kg/m. Physical Exam  Constitutional: She appears well-developed and well-nourished.  HENT:  Head: Normocephalic and atraumatic.  Eyes: EOM are normal. Pupils are equal, round, and reactive to light. Right eye exhibits no discharge. Left eye exhibits no discharge.  Neck: Normal range of motion. Neck supple. No JVD present. No thyromegaly present.  Cardiovascular: Normal rate and regular rhythm.   No murmur heard. Pulmonary/Chest: No respiratory distress. She has no wheezes. She has no rales.  Abdominal: Soft. Bowel sounds are normal. She exhibits no distension. There is no tenderness.  Musculoskeletal: Normal range of motion. She exhibits tenderness. She exhibits no edema.  S/p Left thumb and left ankle fxs, s/p Ortho  Lymphadenopathy:    She has no cervical adenopathy.  Neurological: She is alert. She has normal reflexes. No cranial nerve deficit. She exhibits normal muscle tone. Coordination normal.  Skin: Skin is warm and dry. No rash noted. No erythema.  Healed site of excisional biopsy of skin cancer of right forehead. 1.25 inch scar.  Psychiatric:  persisted confusion, pacing, restlessness, not sleep much at night     Labs reviewed:  Recent Labs  05/28/15 10/08/15 12/03/15  NA 142 142 145  K 4.1 4.2 4.4  BUN 23* 30* 30*  CREATININE 0.8 1.1 1.0    Recent Labs  05/28/15 10/08/15 12/03/15  AST 18 28 20   ALT 8 23 11   ALKPHOS 75 106 69    Recent Labs  05/28/15  10/08/15 12/03/15  WBC 7.0 9.2 7.8  HGB  12.2 11.4* 11.1*  HCT 37 34* 33*  PLT 177 229 199   Lab Results  Component Value Date   TSH 2.58 12/03/2015   No results found for: HGBA1C No results found for: CHOL, HDL, LDLCALC, LDLDIRECT, TRIG, CHOLHDL  Significant Diagnostic Results in last 30 days:  Dg Wrist Complete Right  Result Date: 12/09/2015 CLINICAL DATA:  Fall, right wrist pain/deformity EXAM: RIGHT WRIST - COMPLETE 3+ VIEW COMPARISON:  None. FINDINGS: Mildly displaced distal radial fracture with apex volar angulation, comminuted. Mildly displaced distal ulnar fracture, likely with apex volar angulation. Associated moderate soft tissue swelling. IMPRESSION: Mildly displaced distal radial and ulnar fractures, as above. Electronically Signed   By: Charline Bills M.D.   On: 12/09/2015 20:58   Dg Ankle Complete Left  Result Date: 11/29/2015 CLINICAL DATA:  Left ankle pain after falling from a standing position onto carpeted floor today. Recent ankle fracture. EXAM: LEFT ANKLE COMPLETE - 3+ VIEW COMPARISON:  None. FINDINGS: Mildly comminuted lateral malleolus fracture with mild lateral displacement of the distal fragment. There is a suggestion of some associated periosteal new bone. There is also a posterior malleolus fracture with 2 mm of proximal displacement of the posterior fragment. There is a small avulsion fracture of the distal medial malleolus. Mild diffuse soft tissue swelling. Calcaneal spurs. Small ossific or calcific density proximal to the inferior aspect of the cuboid. IMPRESSION: 1. Try malleolar fracture, as described above. 2. Small calcification or avulsion fracture fragment proximal to the inferior cuboid. Electronically Signed   By: Beckie Salts M.D.   On: 11/29/2015 19:56   Dg Hips Bilat W Or Wo Pelvis 3-4 Views  Result Date: 11/29/2015 CLINICAL DATA:  Bilateral hip pain after falling from a standing position onto a carpeted floor. EXAM: DG HIP (WITH OR WITHOUT PELVIS)  3-4V BILAT COMPARISON:  None. FINDINGS: Mild to moderate right hip degenerative changes and mild left hip degenerative changes. Focal periosteal thickening in the lateral cortex of the mid right femur, compatible with a healed or healing area of stress reaction or stress fracture. Rod and screw fixation of an old, healed left mid femoral shaft fracture. No acute fracture or dislocation seen. Mild lower lumbar spine degenerative changes and mild scoliosis. Atheromatous arterial calcifications, including the abdominal aorta. IMPRESSION: 1. No fracture or dislocation. 2. Chronic changes, as described above. 3. Aortic atherosclerosis. Electronically Signed   By: Beckie Salts M.D.   On: 11/29/2015 19:52    Assessment/Plan  1. Dementia, without behavioral disturbance worsening  2. Adult failure to thrive Poor intake  3. Essential hypertension, benign controlled  4. Depression due to dementia -Seertralinre 100 mg QAM     Past Medical History:  Diagnosis Date  . Adjustment disorder with depressed mood   . Anemia   . Asthma   . Constipation   . Coronary atherosclerosis of native coronary artery   . Dementia in conditions classified elsewhere without behavioral disturbance   . Depression   . DJD (degenerative joint disease)   . Edema   . GERD (gastroesophageal reflux disease)   . History of fall   . Hyperlipidemia   . Hypertension   . Insomnia   . Osteoporosis   . Pain in joint, pelvic region and thigh    left  . Right bundle branch block   . Unspecified constipation   . Unspecified urinary incontinence   . Urinary tract infection, site not specified    Past Surgical History:  Procedure Laterality Date  . ABDOMINAL  HYSTERECTOMY    . CHOLECYSTECTOMY    . FEMUR IM NAIL  06/19/2011   Procedure: INTRAMEDULLARY (IM) NAIL FEMORAL;  Surgeon: Harvie Junior, MD;  Location: [facility] OR;  Service: Orthopedics;  Laterality: Left;  . SKIN CANCER EXCISION  02/15/2013   right forehead Dr. Irene Limbo      Allergies  Allergen Reactions  . Alendronate Sodium Other (See Comments)    Reaction:  Unknown   . Asmanex 120 Metered Doses [Mometasone Furoate] Other (See Comments)    Reaction:  Unknown   . Budesonide-Formoterol Fumarate Other (See Comments)    Reaction:  Unknown   . Fluticasone Propionate (Inhal) Other (See Comments)    Reaction:  Unknown   . Mometasone Furoate Other (See Comments)    Reaction:  Unknown   . Omeprazole Other (See Comments)    Reaction:  Unknown   . Valium [Diazepam] Other (See Comments)    Reaction:  Unknown       Medication List       Accurate as of 12/12/15  1:22 PM. Always use your most recent med list.          acetaminophen 325 MG tablet Commonly known as:  TYLENOL Take 650 mg by mouth every 6 (six) hours as needed for mild pain or moderate pain.   aspirin 81 MG chewable tablet Chew 81 mg by mouth daily.   atorvastatin 10 MG tablet Commonly known as:  LIPITOR Take 10 mg by mouth every evening.   calcium-vitamin D 500-200 MG-UNIT tablet Commonly known as:  OSCAL WITH D Take 1 tablet by mouth 2 (two) times daily.   CERTAVITE SENIOR/ANTIOXIDANT PO Take 1 tablet by mouth daily.   famotidine 20 MG tablet Commonly known as:  PEPCID Take 20 mg by mouth at bedtime.   feeding supplement Liqd Take 1 Container by mouth 3 (three) times daily between meals.   isosorbide mononitrate 30 MG 24 hr tablet Commonly known as:  IMDUR Take 30 mg by mouth daily.   LORazepam 0.5 MG tablet Commonly known as:  ATIVAN Take 0.5 mg by mouth every 4 (four) hours as needed for anxiety.   memantine 10 MG tablet Commonly known as:  NAMENDA Take 10 mg by mouth 2 (two) times daily.   metoprolol succinate 25 MG 24 hr tablet Commonly known as:  TOPROL-XL Take 25 mg by mouth at bedtime.   polyethylene glycol packet Commonly known as:  MIRALAX / GLYCOLAX Take 17 g by mouth daily.   sertraline 100 MG tablet Commonly known as:  ZOLOFT Take 100 mg by  mouth every morning.       Review of Systems  Constitutional: Negative for diaphoresis and fever.  HENT: Negative for congestion and hearing loss.   Eyes: Negative for pain and discharge.  Respiratory: Negative for cough, shortness of breath and wheezing.   Cardiovascular: Negative for chest pain, palpitations and leg swelling.  Gastrointestinal: Negative for abdominal pain, constipation, diarrhea and vomiting.  Genitourinary: Positive for urgency. Negative for dysuria and frequency.  Musculoskeletal: Positive for back pain. Negative for myalgias and neck pain.       S/p Left thumb and left ankle fxs, s/p Ortho, WBAT. S/p right wrist fx, splint, right hand swelling, warmth to touch, making a fist with pain.   Skin: Negative for rash.  Neurological: Negative for seizures and headaches.       Dementia. 09/10/12 MMSE; total 19/30   Psychiatric/Behavioral: Positive for confusion, decreased concentration and dysphoric mood. Negative for hallucinations  and suicidal ideas. The patient is nervous/anxious.     Immunization History  Administered Date(s) Administered  . Influenza Whole 02/24/2012, 02/23/2013  . Influenza-Unspecified 03/04/2014, 01/08/2015  . PPD Test 06/12/2014  . Pneumococcal Polysaccharide-23 04/20/2008  . Td 04/21/2003   Pertinent  Health Maintenance Due  Topic Date Due  . DEXA SCAN  06/26/1991  . PNA vac Low Risk Adult (2 of 2 - PCV13) 04/20/2009  . INFLUENZA VACCINE  11/19/2015   Fall Risk  03/11/2015  Falls in the past year? No  Risk for fall due to : Impaired balance/gait   Functional Status Survey:    Vitals:   12/12/15 1140  BP: 112/76  Pulse: 70  Resp: 20  Temp: 97.2 F (36.2 C)  Weight: 112 lb (50.8 kg)  Height: 5\' 2"  (1.575 m)   Body mass index is 20.49 kg/m. Physical Exam  Constitutional: She appears well-developed and well-nourished.  HENT:  Head: Normocephalic and atraumatic.  Eyes: EOM are normal. Pupils are equal, round, and reactive to  light. Right eye exhibits no discharge. Left eye exhibits no discharge.  Neck: Normal range of motion. Neck supple. No JVD present. No thyromegaly present.  Cardiovascular: Normal rate and regular rhythm.   No murmur heard. Pulmonary/Chest: No respiratory distress. She has no wheezes. She has no rales.  Abdominal: Soft. Bowel sounds are normal. She exhibits no distension. There is no tenderness.  Musculoskeletal: Normal range of motion. She exhibits tenderness. She exhibits no edema.  S/p Left thumb and left ankle fxs, s/p Ortho, WBAT. S/p right wrist fx, splint, right hand swelling, warmth to touch, making a fist with pain.    Lymphadenopathy:    She has no cervical adenopathy.  Neurological: She is alert. She has normal reflexes. No cranial nerve deficit. She exhibits normal muscle tone. Coordination normal.  Skin: Skin is warm and dry. No rash noted. No erythema.  Healed site of excisional biopsy of skin cancer of right forehead. 1.25 inch scar.  Psychiatric:  persisted confusion, pacing, restlessness, not sleep much at night     Labs reviewed:  Recent Labs  05/28/15 10/08/15 12/03/15  NA 142 142 145  K 4.1 4.2 4.4  BUN 23* 30* 30*  CREATININE 0.8 1.1 1.0    Recent Labs  05/28/15 10/08/15 12/03/15  AST 18 28 20   ALT 8 23 11   ALKPHOS 75 106 69    Recent Labs  05/28/15 10/08/15 12/03/15  WBC 7.0 9.2 7.8  HGB 12.2 11.4* 11.1*  HCT 37 34* 33*  PLT 177 229 199   Lab Results  Component Value Date   TSH 2.58 12/03/2015   No results found for: HGBA1C No results found for: CHOL, HDL, LDLCALC, LDLDIRECT, TRIG, CHOLHDL  Significant Diagnostic Results in last 30 days:  Dg Wrist Complete Right  Result Date: 12/09/2015 CLINICAL DATA:  Fall, right wrist pain/deformity EXAM: RIGHT WRIST - COMPLETE 3+ VIEW COMPARISON:  None. FINDINGS: Mildly displaced distal radial fracture with apex volar angulation, comminuted. Mildly displaced distal ulnar fracture, likely with apex volar  angulation. Associated moderate soft tissue swelling. IMPRESSION: Mildly displaced distal radial and ulnar fractures, as above. Electronically Signed   By: Charline Bills M.D.   On: 12/09/2015 20:58   Dg Ankle Complete Left  Result Date: 11/29/2015 CLINICAL DATA:  Left ankle pain after falling from a standing position onto carpeted floor today. Recent ankle fracture. EXAM: LEFT ANKLE COMPLETE - 3+ VIEW COMPARISON:  None. FINDINGS: Mildly comminuted lateral malleolus fracture with mild  lateral displacement of the distal fragment. There is a suggestion of some associated periosteal new bone. There is also a posterior malleolus fracture with 2 mm of proximal displacement of the posterior fragment. There is a small avulsion fracture of the distal medial malleolus. Mild diffuse soft tissue swelling. Calcaneal spurs. Small ossific or calcific density proximal to the inferior aspect of the cuboid. IMPRESSION: 1. Try malleolar fracture, as described above. 2. Small calcification or avulsion fracture fragment proximal to the inferior cuboid. Electronically Signed   By: Beckie SaltsSteven  Reid M.D.   On: 11/29/2015 19:56   Dg Hips Bilat W Or Wo Pelvis 3-4 Views  Result Date: 11/29/2015 CLINICAL DATA:  Bilateral hip pain after falling from a standing position onto a carpeted floor. EXAM: DG HIP (WITH OR WITHOUT PELVIS) 3-4V BILAT COMPARISON:  None. FINDINGS: Mild to moderate right hip degenerative changes and mild left hip degenerative changes. Focal periosteal thickening in the lateral cortex of the mid right femur, compatible with a healed or healing area of stress reaction or stress fracture. Rod and screw fixation of an old, healed left mid femoral shaft fracture. No acute fracture or dislocation seen. Mild lower lumbar spine degenerative changes and mild scoliosis. Atheromatous arterial calcifications, including the abdominal aorta. IMPRESSION: 1. No fracture or dislocation. 2. Chronic changes, as described above. 3.  Aortic atherosclerosis. Electronically Signed   By: Beckie SaltsSteven  Reid M.D.   On: 11/29/2015 19:52    Assessment/Plan Depression due to dementia 07/16/14 crying episodes, poor appetite, lethargy-labs unremarkable-repeat UA C/S and starts Sertraline 25mg  daily. Off Mirtazapine.  Improved mood, anxiety, agitation, and attempts to leave facility, continue Sertraline 100mg  daily    Right wrist fracture 12/09/15 X-ray Mildly displaced distal radial and ulnar fractures 8/241/7 s/p ED, s/p Ortho, continue splint, f/u Ortho.    Essential hypertension, benign Controlled, continue Metoprolol 25mg  daily. 12/03/15 Hgb 11.1, Na 145, K 4.4, Bun 30, creat 0.96, TP 5.7, albumin 3.3, TSH 2.58   CAD (coronary artery disease) of artery bypass graft No c/o angina since last visited. Continue Isosorbide 30mg  daily, Atorvastatin 10mg , ASA 81mg     GERD (gastroesophageal reflux disease) Stable. Continue Famotidine 20mg  daily.    Constipation Stable. Continue MiraLax daily.    Dementia 09/2013 MMSE 10/30 04/24/14 MMSE 8/30 Progressing, continue Namenda for now, Lorazepam prn for agitation, update CBC, CMP, TSH Lack of safety awareness and increased frailty/gait problem contributory to her falling 12/03/15 Hgb 11.1, Na 145, K 4.4, Bun 30, creat 0.96, TP 5.7, albumin 3.3, TSH 2.58    Dementia due to medical condition without behavioral disturbance 09/2013 MMSE 10/30 04/24/14 MMSE 8/30 Progressing, continue Namenda for now, Lorazepam prn for agitation, update CBC, CMP, TSH Lack of safety awareness and increased frailty/gait problem contributory to her falling 12/03/15 Hgb 11.1, Na 145, K 4.4, Bun 30, creat 0.96, TP 5.7, albumin 3.3, TSH 2.58       Ankle fracture, left WBAT, ambulate with [place] with SBA   Closed fracture of left thumb 10/04/15 X-ray acute intra-articular fracture proximal phalanx thumb., 10/05/15 Ortho thumb immobilization   Adult failure to thrive Continue to decline physically,  cognitively, weight loss, ADL dependency. Continue to supportive care. CBC, CMP, TSH 05/28/15 unremarkable. Treated UTI has limited efficacy in improving mood or physical health. Continue worsening mood, agitation, anxiety, attempting leaving facility, continue to stabilize mood with Sertraline.          Family/ staff Communication: continue SNF, Memory Care unit for care assistance. F/u Ortho for the right forearm  fx.   Labs/tests ordered:  none

## 2015-12-27 ENCOUNTER — Non-Acute Institutional Stay (SKILLED_NURSING_FACILITY): Payer: Medicare Other | Admitting: Nurse Practitioner

## 2015-12-27 ENCOUNTER — Encounter: Payer: Self-pay | Admitting: Nurse Practitioner

## 2015-12-27 DIAGNOSIS — F0280 Dementia in other diseases classified elsewhere without behavioral disturbance: Secondary | ICD-10-CM | POA: Diagnosis not present

## 2015-12-27 DIAGNOSIS — K219 Gastro-esophageal reflux disease without esophagitis: Secondary | ICD-10-CM | POA: Diagnosis not present

## 2015-12-27 DIAGNOSIS — F0393 Unspecified dementia, unspecified severity, with mood disturbance: Secondary | ICD-10-CM

## 2015-12-27 DIAGNOSIS — F0390 Unspecified dementia without behavioral disturbance: Secondary | ICD-10-CM | POA: Diagnosis not present

## 2015-12-27 DIAGNOSIS — G4700 Insomnia, unspecified: Secondary | ICD-10-CM | POA: Diagnosis not present

## 2015-12-27 DIAGNOSIS — K5900 Constipation, unspecified: Secondary | ICD-10-CM

## 2015-12-27 DIAGNOSIS — F329 Major depressive disorder, single episode, unspecified: Secondary | ICD-10-CM

## 2015-12-27 DIAGNOSIS — M79632 Pain in left forearm: Secondary | ICD-10-CM | POA: Insufficient documentation

## 2015-12-27 DIAGNOSIS — I10 Essential (primary) hypertension: Secondary | ICD-10-CM | POA: Diagnosis not present

## 2015-12-27 NOTE — Progress Notes (Signed)
Location:   [facility] Room Number: 100 Place of Service:  ALF [phone]) Provider:  Barrett Goldie, Manxie  NP  Murray Hodgkins, MD  Patient Care Team: Kimber Relic, MD as PCP - General (Internal Medicine) [facility] Karalyn Kadel Johnney Ou, NP as Nurse Practitioner (Nurse Practitioner)  Extended Emergency Contact Information Primary Emergency Contact: [facility] Address: [address] [place]          [name],  [address] Home Phone: [phone] Relation: None  Code Status:  DNR Goals of care: Advanced Directive information Advanced Directives 12/27/2015  Does patient have an advance directive? Yes  Type of Advance Directive Out of facility DNR (pink MOST or yellow form)  Does patient want to make changes to advanced directive? No - Patient declined  Copy of advanced directive(s) in chart? Yes  Pre-existing out of facility DNR order (yellow form or pink MOST form) -     Chief Complaint  Patient presents with  . Acute Visit    Complaint of left forearm pain    HPI:  Pt is a 82 y.o. female seen today for an acute visit for left forearm pain, no decreased ROM of the left elbow or wrist, no apparent injury. Pain is mild, prn   S/p right forearm fx, in slpint,  s/p ED and Ortho consult, f/u Ortho, swelling of the right forearm and hand noted, able to make a fist with pain, denied tingling, warmth to touch.   ED 12/09/15 X-ray showed Mildly displaced distal radial and ulnar fractures   S/p left ankle fx, left thumb fx, saw Dr. Luiz Blare 11/26/15, WBAT, worsened gait since the fxs. X-ray There is a small avulsion fracture of the distal medial malleolus.  Hx of dementia, resides in [facility], taking Namenda for memory, depression, better with Sertraline, HTN, controlled on Metoprolol and Imdur, constipation, stable on MirLax daily, GERD, stable while on Famotidine 20mg , FTT supportive measurs.    Past Medical History:  Diagnosis Date  . Adjustment disorder with depressed mood   .  Anemia   . Asthma   . Constipation   . Coronary atherosclerosis of native coronary artery   . Dementia in conditions classified elsewhere without behavioral disturbance   . Depression   . DJD (degenerative joint disease)   . Edema   . GERD (gastroesophageal reflux disease)   . History of fall   . Hyperlipidemia   . Hypertension   . Insomnia   . Osteoporosis   . Pain in joint, pelvic region and thigh    left  . Right bundle branch block   . Unspecified constipation   . Unspecified urinary incontinence   . Urinary tract infection, site not specified    Past Surgical History:  Procedure Laterality Date  . ABDOMINAL HYSTERECTOMY    . CHOLECYSTECTOMY    . FEMUR IM NAIL  06/19/2011   Procedure: INTRAMEDULLARY (IM) NAIL FEMORAL;  Surgeon: Harvie Junior, MD;  Location: [facility] OR;  Service: Orthopedics;  Laterality: Left;  . SKIN CANCER EXCISION  02/15/2013   right forehead Dr. Irene Limbo    Allergies  Allergen Reactions  . Alendronate Sodium Other (See Comments)    Reaction:  Unknown   . Asmanex 120 Metered Doses [Mometasone Furoate] Other (See Comments)    Reaction:  Unknown   . Budesonide-Formoterol Fumarate Other (See Comments)    Reaction:  Unknown   . Fluticasone Propionate (Inhal) Other (See Comments)    Reaction:  Unknown   . Mometasone Furoate  Other (See Comments)    Reaction:  Unknown   . Omeprazole Other (See Comments)    Reaction:  Unknown   . Valium [Diazepam] Other (See Comments)    Reaction:  Unknown       Medication List       Accurate as of 12/27/15  5:10 PM. Always use your most recent med list.          acetaminophen 325 MG tablet Commonly known as:  TYLENOL Take 650 mg by mouth every 6 (six) hours as needed for mild pain or moderate pain.   aspirin 81 MG chewable tablet Chew 81 mg by mouth daily.   atorvastatin 10 MG tablet Commonly known as:  LIPITOR Take 10 mg by mouth every evening.   calcium-vitamin D 500-200 MG-UNIT tablet Commonly known as:   OSCAL WITH D Take 1 tablet by mouth 2 (two) times daily.   CERTAVITE SENIOR/ANTIOXIDANT PO Take 1 tablet by mouth daily.   famotidine 20 MG tablet Commonly known as:  PEPCID Take 20 mg by mouth at bedtime.   feeding supplement Liqd Take 1 Container by mouth 3 (three) times daily between meals.   isosorbide mononitrate 30 MG 24 hr tablet Commonly known as:  IMDUR Take 30 mg by mouth daily.   LORazepam 0.5 MG tablet Commonly known as:  ATIVAN Take 0.5 mg by mouth every 4 (four) hours as needed for anxiety.   memantine 10 MG tablet Commonly known as:  NAMENDA Take 10 mg by mouth 2 (two) times daily.   metoprolol succinate 25 MG 24 hr tablet Commonly known as:  TOPROL-XL Take 25 mg by mouth at bedtime.   polyethylene glycol packet Commonly known as:  MIRALAX / GLYCOLAX Take 17 g by mouth daily.   sertraline 100 MG tablet Commonly known as:  ZOLOFT Take 100 mg by mouth every morning.       Review of Systems  Constitutional: Negative for diaphoresis and fever.  HENT: Negative for congestion and hearing loss.   Eyes: Negative for pain and discharge.  Respiratory: Negative for cough, shortness of breath and wheezing.   Cardiovascular: Negative for chest pain, palpitations and leg swelling.  Gastrointestinal: Negative for abdominal pain, constipation, diarrhea and vomiting.  Genitourinary: Positive for urgency. Negative for dysuria and frequency.  Musculoskeletal: Positive for back pain. Negative for myalgias and neck pain.       S/p Left thumb and left ankle fxs, s/p Ortho, WBAT. S/p right wrist fx, splint, right hand swelling, warmth to touch, making a fist with pain. Left fore arm pain.   Skin: Negative for rash.  Neurological: Negative for seizures and headaches.       Dementia. 09/10/12 MMSE; total 19/30   Psychiatric/Behavioral: Positive for confusion, decreased concentration and dysphoric mood. Negative for hallucinations and suicidal ideas. The patient is  nervous/anxious.     Immunization History  Administered Date(s) Administered  . Influenza Whole 02/24/2012, 02/23/2013  . Influenza-Unspecified 03/04/2014, 01/08/2015  . PPD Test 06/12/2014  . Pneumococcal Polysaccharide-23 04/20/2008  . Td 04/21/2003   Pertinent  Health Maintenance Due  Topic Date Due  . DEXA SCAN  06/26/1991  . PNA vac Low Risk Adult (2 of 2 - PCV13) 04/20/2009  . INFLUENZA VACCINE  11/19/2015   Fall Risk  03/11/2015  Falls in the past year? No  Risk for fall due to : Impaired balance/gait   Functional Status Survey:    Vitals:   12/27/15 1406  BP: 122/70  Pulse: 68  Resp: 18  Temp: 97.8 F (36.6 C)  Weight: 110 lb 3.2 oz (50 kg)  Height: 5\' 2"  (1.575 m)   Body mass index is 20.16 kg/m. Physical Exam  Constitutional: She appears well-developed and well-nourished.  HENT:  Head: Normocephalic and atraumatic.  Eyes: EOM are normal. Pupils are equal, round, and reactive to light. Right eye exhibits no discharge. Left eye exhibits no discharge.  Neck: Normal range of motion. Neck supple. No JVD present. No thyromegaly present.  Cardiovascular: Normal rate and regular rhythm.   No murmur heard. Pulmonary/Chest: No respiratory distress. She has no wheezes. She has no rales.  Abdominal: Soft. Bowel sounds are normal. She exhibits no distension. There is no tenderness.  Musculoskeletal: Normal range of motion. She exhibits tenderness. She exhibits no edema.  S/p Left thumb and left ankle fxs, s/p Ortho, WBAT. S/p right wrist fx, splint, right hand swelling, warmth to touch, making a fist with pain. C/o left forearm pain.    Lymphadenopathy:    She has no cervical adenopathy.  Neurological: She is alert. She has normal reflexes. No cranial nerve deficit. She exhibits normal muscle tone. Coordination normal.  Skin: Skin is warm and dry. No rash noted. No erythema.  Healed site of excisional biopsy of skin cancer of right forehead. 1.25 inch scar.    Psychiatric:  persisted confusion, pacing, restlessness, not sleep much at night     Labs reviewed:  Recent Labs  05/28/15 10/08/15 12/03/15  NA 142 142 145  K 4.1 4.2 4.4  BUN 23* 30* 30*  CREATININE 0.8 1.1 1.0    Recent Labs  05/28/15 10/08/15 12/03/15  AST 18 28 20   ALT 8 23 11   ALKPHOS 75 106 69    Recent Labs  05/28/15 10/08/15 12/03/15  WBC 7.0 9.2 7.8  HGB 12.2 11.4* 11.1*  HCT 37 34* 33*  PLT 177 229 199   Lab Results  Component Value Date   TSH 2.58 12/03/2015   No results found for: HGBA1C No results found for: CHOL, HDL, LDLCALC, LDLDIRECT, TRIG, CHOLHDL  Significant Diagnostic Results in last 30 days:  Dg Wrist Complete Right  Result Date: 12/09/2015 CLINICAL DATA:  Fall, right wrist pain/deformity EXAM: RIGHT WRIST - COMPLETE 3+ VIEW COMPARISON:  None. FINDINGS: Mildly displaced distal radial fracture with apex volar angulation, comminuted. Mildly displaced distal ulnar fracture, likely with apex volar angulation. Associated moderate soft tissue swelling. IMPRESSION: Mildly displaced distal radial and ulnar fractures, as above. Electronically Signed   By: Charline Bills M.D.   On: 12/09/2015 20:58   Dg Ankle Complete Left  Result Date: 11/29/2015 CLINICAL DATA:  Left ankle pain after falling from a standing position onto carpeted floor today. Recent ankle fracture. EXAM: LEFT ANKLE COMPLETE - 3+ VIEW COMPARISON:  None. FINDINGS: Mildly comminuted lateral malleolus fracture with mild lateral displacement of the distal fragment. There is a suggestion of some associated periosteal new bone. There is also a posterior malleolus fracture with 2 mm of proximal displacement of the posterior fragment. There is a small avulsion fracture of the distal medial malleolus. Mild diffuse soft tissue swelling. Calcaneal spurs. Small ossific or calcific density proximal to the inferior aspect of the cuboid. IMPRESSION: 1. Try malleolar fracture, as described above. 2. Small  calcification or avulsion fracture fragment proximal to the inferior cuboid. Electronically Signed   By: Beckie Salts M.D.   On: 11/29/2015 19:56   Dg Hips Bilat W Or Wo Pelvis 3-4 Views  Result Date: 11/29/2015  CLINICAL DATA:  Bilateral hip pain after falling from a standing position onto a carpeted floor. EXAM: DG HIP (WITH OR WITHOUT PELVIS) 3-4V BILAT COMPARISON:  None. FINDINGS: Mild to moderate right hip degenerative changes and mild left hip degenerative changes. Focal periosteal thickening in the lateral cortex of the mid right femur, compatible with a healed or healing area of stress reaction or stress fracture. Rod and screw fixation of an old, healed left mid femoral shaft fracture. No acute fracture or dislocation seen. Mild lower lumbar spine degenerative changes and mild scoliosis. Atheromatous arterial calcifications, including the abdominal aorta. IMPRESSION: 1. No fracture or dislocation. 2. Chronic changes, as described above. 3. Aortic atherosclerosis. Electronically Signed   By: Beckie SaltsSteven  Reid M.D.   On: 11/29/2015 19:52    Assessment/Plan There are no diagnoses linked to this encounter. Left forearm pain X-ray left forearm and wrist, AP and lateral views to r/o fx if pain assessment indicates, the patient denied pain in the left forearm upon my examination today, continue Tylenol prn for pain, apply Biofreeze and Lidocaine patch.   Essential hypertension, benign Controlled, continue Metoprolol 25mg  daily. 12/03/15 Hgb 11.1, Na 145, K 4.4, Bun 30, creat 0.96, TP 5.7, albumin 3.3, TSH 2.58   GERD (gastroesophageal reflux disease) Stable. Continue Famotidine 20mg  daily.    Constipation Stable. Continue MiraLax daily.     Dementia 09/2013 MMSE 10/30 04/24/14 MMSE 8/30 Progressing, continue Namenda for now, Lorazepam prn for agitation, update CBC, CMP, TSH Lack of safety awareness and increased frailty/gait problem contributory to her falling 12/03/15 Hgb 11.1, Na 145, K 4.4,  Bun 30, creat 0.96, TP 5.7, albumin 3.3, TSH 2.58    Depression due to dementia 07/16/14 crying episodes, poor appetite, lethargy-labs unremarkable-repeat UA C/S and starts Sertraline 25mg  daily. Off Mirtazapine.  Improved mood, anxiety, agitation, and attempts to leave facility, continue Sertraline 100mg  daily    Insomnia Stable.      Family/ staff Communication:continue SNF for care assistance.  Labs/tests ordered: X-ray left forearm and wrist.

## 2015-12-27 NOTE — Assessment & Plan Note (Signed)
Controlled, continue Metoprolol 25mg daily. 12/03/15 Hgb 11.1, Na 145, K 4.4, Bun 30, creat 0.96, TP 5.7, albumin 3.3, TSH 2.58  

## 2015-12-27 NOTE — Assessment & Plan Note (Addendum)
X-ray left forearm and wrist, AP and lateral views to r/o fx if pain assessment indicates, the patient denied pain in the left forearm upon my examination today, continue Tylenol prn for pain, apply Biofreeze and Lidocaine patch.

## 2015-12-27 NOTE — Assessment & Plan Note (Signed)
Stable

## 2015-12-27 NOTE — Assessment & Plan Note (Signed)
09/2013 MMSE 10/30 04/24/14 MMSE 8/30 Progressing, continue Namenda for now, Lorazepam prn for agitation, update CBC, CMP, TSH Lack of safety awareness and increased frailty/gait problem contributory to her falling 12/03/15 Hgb 11.1, Na 145, K 4.4, Bun 30, creat 0.96, TP 5.7, albumin 3.3, TSH 2.58      

## 2015-12-27 NOTE — Assessment & Plan Note (Signed)
Stable. Continue MiraLax daily.  

## 2015-12-27 NOTE — Assessment & Plan Note (Signed)
07/16/14 crying episodes, poor appetite, lethargy-labs unremarkable-repeat UA C/S and starts Sertraline 25mg  daily. Off Mirtazapine.  Improved mood, anxiety, agitation, and attempts to leave facility, continue Sertraline 100mg  daily

## 2015-12-27 NOTE — Assessment & Plan Note (Signed)
Stable. Continue Famotidine 20mg daily.  

## 2015-12-30 NOTE — Progress Notes (Signed)
Location:   [facility] Room Number: 100 Place of Service:  SNF (31) Provider:  Mast, Manxie  NP  Murray Hodgkins, MD  Patient Care Team: Kimber Relic, MD as PCP - General (Internal Medicine) [facility] Johnney Ou, NP as Nurse Practitioner (Nurse Practitioner)  Extended Emergency Contact Information Primary Emergency Contact: [facility] Address: [address] [place]          [name],  [address] Home Phone: [phone] Relation: None  Code Status:  DNR Goals of care: Advanced Directive information Advanced Directives 12/27/2015  Does patient have an advance directive? Yes  Type of Advance Directive Out of facility DNR (pink MOST or yellow form)  Does patient want to make changes to advanced directive? No - Patient declined  Copy of advanced directive(s) in chart? Yes  Pre-existing out of facility DNR order (yellow form or pink MOST form) -     Chief Complaint  Patient presents with  . Acute Visit    Complaint of left forearm pain    HPI:  Pt is a 82 y.o. female seen today for an acute visit for left forearm pain, no decreased ROM of the left elbow or wrist, no apparent injury. Pain is mild, prn   S/p right forearm fx, in slpint,  s/p ED and Ortho consult, f/u Ortho, swelling of the right forearm and hand noted, able to make a fist with pain, denied tingling, warmth to touch.   ED 12/09/15 X-ray showed Mildly displaced distal radial and ulnar fractures   S/p left ankle fx, left thumb fx, saw Dr. Luiz Blare 11/26/15, WBAT, worsened gait since the fxs. X-ray There is a small avulsion fracture of the distal medial malleolus.  Hx of dementia, resides in [facility], taking Namenda for memory, depression, better with Sertraline, HTN, controlled on Metoprolol and Imdur, constipation, stable on MirLax daily, GERD, stable while on Famotidine 20mg , FTT supportive measurs.    Past Medical History:  Diagnosis Date  . Adjustment disorder with depressed mood   .  Anemia   . Asthma   . Constipation   . Coronary atherosclerosis of native coronary artery   . Dementia in conditions classified elsewhere without behavioral disturbance   . Depression   . DJD (degenerative joint disease)   . Edema   . GERD (gastroesophageal reflux disease)   . History of fall   . Hyperlipidemia   . Hypertension   . Insomnia   . Osteoporosis   . Pain in joint, pelvic region and thigh    left  . Right bundle branch block   . Unspecified constipation   . Unspecified urinary incontinence   . Urinary tract infection, site not specified    Past Surgical History:  Procedure Laterality Date  . ABDOMINAL HYSTERECTOMY    . CHOLECYSTECTOMY    . FEMUR IM NAIL  06/19/2011   Procedure: INTRAMEDULLARY (IM) NAIL FEMORAL;  Surgeon: Harvie Junior, MD;  Location: [facility] OR;  Service: Orthopedics;  Laterality: Left;  . SKIN CANCER EXCISION  02/15/2013   right forehead Dr. Irene Limbo    Allergies  Allergen Reactions  . Alendronate Sodium Other (See Comments)    Reaction:  Unknown   . Asmanex 120 Metered Doses [Mometasone Furoate] Other (See Comments)    Reaction:  Unknown   . Budesonide-Formoterol Fumarate Other (See Comments)    Reaction:  Unknown   . Fluticasone Propionate (Inhal) Other (See Comments)    Reaction:  Unknown   . Mometasone Furoate  Other (See Comments)    Reaction:  Unknown   . Omeprazole Other (See Comments)    Reaction:  Unknown   . Valium [Diazepam] Other (See Comments)    Reaction:  Unknown       Medication List       Accurate as of 12/27/15 11:59 PM. Always use your most recent med list.          acetaminophen 325 MG tablet Commonly known as:  TYLENOL Take 650 mg by mouth every 6 (six) hours as needed for mild pain or moderate pain.   aspirin 81 MG chewable tablet Chew 81 mg by mouth daily.   atorvastatin 10 MG tablet Commonly known as:  LIPITOR Take 10 mg by mouth every evening.   calcium-vitamin D 500-200 MG-UNIT tablet Commonly known as:   OSCAL WITH D Take 1 tablet by mouth 2 (two) times daily.   CERTAVITE SENIOR/ANTIOXIDANT PO Take 1 tablet by mouth daily.   famotidine 20 MG tablet Commonly known as:  PEPCID Take 20 mg by mouth at bedtime.   feeding supplement Liqd Take 1 Container by mouth 3 (three) times daily between meals.   isosorbide mononitrate 30 MG 24 hr tablet Commonly known as:  IMDUR Take 30 mg by mouth daily.   LORazepam 0.5 MG tablet Commonly known as:  ATIVAN Take 0.5 mg by mouth every 4 (four) hours as needed for anxiety.   memantine 10 MG tablet Commonly known as:  NAMENDA Take 10 mg by mouth 2 (two) times daily.   metoprolol succinate 25 MG 24 hr tablet Commonly known as:  TOPROL-XL Take 25 mg by mouth at bedtime.   polyethylene glycol packet Commonly known as:  MIRALAX / GLYCOLAX Take 17 g by mouth daily.   sertraline 100 MG tablet Commonly known as:  ZOLOFT Take 100 mg by mouth every morning.       Review of Systems  Constitutional: Negative for diaphoresis and fever.  HENT: Negative for congestion and hearing loss.   Eyes: Negative for pain and discharge.  Respiratory: Negative for cough, shortness of breath and wheezing.   Cardiovascular: Negative for chest pain, palpitations and leg swelling.  Gastrointestinal: Negative for abdominal pain, constipation, diarrhea and vomiting.  Genitourinary: Positive for urgency. Negative for dysuria and frequency.  Musculoskeletal: Positive for back pain. Negative for myalgias and neck pain.       S/p Left thumb and left ankle fxs, s/p Ortho, WBAT. S/p right wrist fx, splint, right hand swelling, warmth to touch, making a fist with pain. Left fore arm pain.   Skin: Negative for rash.  Neurological: Negative for seizures and headaches.       Dementia. 09/10/12 MMSE; total 19/30   Psychiatric/Behavioral: Positive for confusion, decreased concentration and dysphoric mood. Negative for hallucinations and suicidal ideas. The patient is  nervous/anxious.     Immunization History  Administered Date(s) Administered  . Influenza Whole 02/24/2012, 02/23/2013  . Influenza-Unspecified 03/04/2014, 01/08/2015  . PPD Test 06/12/2014  . Pneumococcal Polysaccharide-23 04/20/2008  . Td 04/21/2003   Pertinent  Health Maintenance Due  Topic Date Due  . DEXA SCAN  06/26/1991  . PNA vac Low Risk Adult (2 of 2 - PCV13) 04/20/2009  . INFLUENZA VACCINE  11/19/2015   Fall Risk  03/11/2015  Falls in the past year? No  Risk for fall due to : Impaired balance/gait   Functional Status Survey:    Vitals:   12/27/15 1406  BP: 122/70  Pulse: 68  Resp:  18  Temp: 97.8 F (36.6 C)  Weight: 110 lb 3.2 oz (50 kg)  Height: 5\' 2"  (1.575 m)   Body mass index is 20.16 kg/m. Physical Exam  Constitutional: She appears well-developed and well-nourished.  HENT:  Head: Normocephalic and atraumatic.  Eyes: EOM are normal. Pupils are equal, round, and reactive to light. Right eye exhibits no discharge. Left eye exhibits no discharge.  Neck: Normal range of motion. Neck supple. No JVD present. No thyromegaly present.  Cardiovascular: Normal rate and regular rhythm.   No murmur heard. Pulmonary/Chest: No respiratory distress. She has no wheezes. She has no rales.  Abdominal: Soft. Bowel sounds are normal. She exhibits no distension. There is no tenderness.  Musculoskeletal: Normal range of motion. She exhibits tenderness. She exhibits no edema.  S/p Left thumb and left ankle fxs, s/p Ortho, WBAT. S/p right wrist fx, splint, right hand swelling, warmth to touch, making a fist with pain. C/o left forearm pain.    Lymphadenopathy:    She has no cervical adenopathy.  Neurological: She is alert. She has normal reflexes. No cranial nerve deficit. She exhibits normal muscle tone. Coordination normal.  Skin: Skin is warm and dry. No rash noted. No erythema.  Healed site of excisional biopsy of skin cancer of right forehead. 1.25 inch scar.    Psychiatric:  persisted confusion, pacing, restlessness, not sleep much at night     Labs reviewed:  Recent Labs  05/28/15 10/08/15 12/03/15  NA 142 142 145  K 4.1 4.2 4.4  BUN 23* 30* 30*  CREATININE 0.8 1.1 1.0    Recent Labs  05/28/15 10/08/15 12/03/15  AST 18 28 20   ALT 8 23 11   ALKPHOS 75 106 69    Recent Labs  05/28/15 10/08/15 12/03/15  WBC 7.0 9.2 7.8  HGB 12.2 11.4* 11.1*  HCT 37 34* 33*  PLT 177 229 199   Lab Results  Component Value Date   TSH 2.58 12/03/2015   No results found for: HGBA1C No results found for: CHOL, HDL, LDLCALC, LDLDIRECT, TRIG, CHOLHDL  Significant Diagnostic Results in last 30 days:  Dg Wrist Complete Right  Result Date: 12/09/2015 CLINICAL DATA:  Fall, right wrist pain/deformity EXAM: RIGHT WRIST - COMPLETE 3+ VIEW COMPARISON:  None. FINDINGS: Mildly displaced distal radial fracture with apex volar angulation, comminuted. Mildly displaced distal ulnar fracture, likely with apex volar angulation. Associated moderate soft tissue swelling. IMPRESSION: Mildly displaced distal radial and ulnar fractures, as above. Electronically Signed   By: Charline Bills M.D.   On: 12/09/2015 20:58    Assessment/Plan There are no diagnoses linked to this encounter. Left forearm pain X-ray left forearm and wrist, AP and lateral views to r/o fx if pain assessment indicates, the patient denied pain in the left forearm upon my examination today, continue Tylenol prn for pain, apply Biofreeze and Lidocaine patch.   Essential hypertension, benign Controlled, continue Metoprolol 25mg  daily. 12/03/15 Hgb 11.1, Na 145, K 4.4, Bun 30, creat 0.96, TP 5.7, albumin 3.3, TSH 2.58   GERD (gastroesophageal reflux disease) Stable. Continue Famotidine 20mg  daily.    Constipation Stable. Continue MiraLax daily.     Dementia 09/2013 MMSE 10/30 04/24/14 MMSE 8/30 Progressing, continue Namenda for now, Lorazepam prn for agitation, update CBC, CMP, TSH Lack of  safety awareness and increased frailty/gait problem contributory to her falling 12/03/15 Hgb 11.1, Na 145, K 4.4, Bun 30, creat 0.96, TP 5.7, albumin 3.3, TSH 2.58    Depression due to dementia 07/16/14 crying  episodes, poor appetite, lethargy-labs unremarkable-repeat UA C/S and starts Sertraline 25mg  daily. Off Mirtazapine.  Improved mood, anxiety, agitation, and attempts to leave facility, continue Sertraline 100mg  daily    Insomnia Stable.      Family/ staff Communication:continue SNF for care assistance.  Labs/tests ordered: X-ray left forearm and wrist.

## 2016-01-03 ENCOUNTER — Non-Acute Institutional Stay (SKILLED_NURSING_FACILITY): Payer: Medicare Other | Admitting: Nurse Practitioner

## 2016-01-03 ENCOUNTER — Encounter: Payer: Self-pay | Admitting: Nurse Practitioner

## 2016-01-03 DIAGNOSIS — K5900 Constipation, unspecified: Secondary | ICD-10-CM | POA: Diagnosis not present

## 2016-01-03 DIAGNOSIS — Z9181 History of falling: Secondary | ICD-10-CM | POA: Diagnosis not present

## 2016-01-03 DIAGNOSIS — F0390 Unspecified dementia without behavioral disturbance: Secondary | ICD-10-CM

## 2016-01-03 DIAGNOSIS — K219 Gastro-esophageal reflux disease without esophagitis: Secondary | ICD-10-CM

## 2016-01-03 DIAGNOSIS — F0280 Dementia in other diseases classified elsewhere without behavioral disturbance: Secondary | ICD-10-CM

## 2016-01-03 DIAGNOSIS — F329 Major depressive disorder, single episode, unspecified: Secondary | ICD-10-CM

## 2016-01-03 DIAGNOSIS — R627 Adult failure to thrive: Secondary | ICD-10-CM | POA: Diagnosis not present

## 2016-01-03 DIAGNOSIS — I25700 Atherosclerosis of coronary artery bypass graft(s), unspecified, with unstable angina pectoris: Secondary | ICD-10-CM | POA: Diagnosis not present

## 2016-01-03 DIAGNOSIS — I10 Essential (primary) hypertension: Secondary | ICD-10-CM

## 2016-01-03 DIAGNOSIS — F0393 Unspecified dementia, unspecified severity, with mood disturbance: Secondary | ICD-10-CM

## 2016-01-03 NOTE — Assessment & Plan Note (Signed)
Stable. Continue Famotidine 20mg daily.  

## 2016-01-03 NOTE — Assessment & Plan Note (Signed)
07/16/14 crying episodes, poor appetite, lethargy-labs unremarkable-repeat UA C/S and starts Sertraline 25mg daily. Off Mirtazapine.  Improved mood, anxiety, agitation, and attempts to leave facility, continue Sertraline 100mg daily   

## 2016-01-03 NOTE — Assessment & Plan Note (Signed)
Stable. Continue MiraLax daily.  

## 2016-01-03 NOTE — Assessment & Plan Note (Signed)
Controlled, continue Metoprolol 25mg daily. 12/03/15 Hgb 11.1, Na 145, K 4.4, Bun 30, creat 0.96, TP 5.7, albumin 3.3, TSH 2.58  

## 2016-01-03 NOTE — Assessment & Plan Note (Signed)
Continue to decline physically, cognitively, weight loss, ADL dependency, frequent falling. Continue safety supervision, supportive care.     

## 2016-01-03 NOTE — Progress Notes (Signed)
Location:   [facility] Room Number: 100 Place of Service:  SNF (31) Provider:  Mast, Manxie  NP  Murray Hodgkins, MD  Patient Care Team: Kimber Relic, MD as PCP - General (Internal Medicine) [facility] Johnney Ou, NP as Nurse Practitioner (Nurse Practitioner)  Extended Emergency Contact Information Primary Emergency Contact: [facility] Address: [address] [place]          [name],  [address] Home Phone: [phone] Relation: None  Code Status:  DNR Goals of care: Advanced Directive information Advanced Directives 01/03/2016  Does patient have an advance directive? Yes  Type of Advance Directive Out of facility DNR (pink MOST or yellow form)  Does patient want to make changes to advanced directive? No - Patient declined  Copy of advanced directive(s) in chart? Yes  Pre-existing out of facility DNR order (yellow form or pink MOST form) -     Chief Complaint  Patient presents with  . Acute Visit    slipped out of wheel-chair to floor    HPI:  Pt is a 82 y.o. female seen today for an acute visit for frequent falling, Bp 120/70 lying, 110/70 standing. Recent right displaced radial and ulnar fxs, left ankle x, left thumb fx sustained from falling. Her lack of safety awareness and increased frailty contributed to falls.   Hx of dementia, resides in [facility] SNF, taking Namenda for memory, depression, better with Sertraline, HTN, controlled on Metoprolol and Imdur, constipation, stable on MirLax daily, GERD, stable while on Famotidine 20mg , FTT supportive measurs.                 Past Medical History:  Diagnosis Date  . Adjustment disorder with depressed mood   . Anemia   . Asthma   . Constipation   . Coronary atherosclerosis of native coronary artery   . Dementia in conditions classified elsewhere without behavioral disturbance   . Depression   . DJD (degenerative joint disease)   . Edema   . GERD (gastroesophageal reflux disease)   .  History of fall   . Hyperlipidemia   . Hypertension   . Insomnia   . Osteoporosis   . Pain in joint, pelvic region and thigh    left  . Right bundle branch block   . Unspecified constipation   . Unspecified urinary incontinence   . Urinary tract infection, site not specified    Past Surgical History:  Procedure Laterality Date  . ABDOMINAL HYSTERECTOMY    . CHOLECYSTECTOMY    . FEMUR IM NAIL  06/19/2011   Procedure: INTRAMEDULLARY (IM) NAIL FEMORAL;  Surgeon: Harvie Junior, MD;  Location: [facility] OR;  Service: Orthopedics;  Laterality: Left;  . SKIN CANCER EXCISION  02/15/2013   right forehead Dr. Irene Limbo    Allergies  Allergen Reactions  . Alendronate Sodium Other (See Comments)    Reaction:  Unknown   . Asmanex 120 Metered Doses [Mometasone Furoate] Other (See Comments)    Reaction:  Unknown   . Budesonide-Formoterol Fumarate Other (See Comments)    Reaction:  Unknown   . Fluticasone Propionate (Inhal) Other (See Comments)    Reaction:  Unknown   . Mometasone Furoate Other (See Comments)    Reaction:  Unknown   . Omeprazole Other (See Comments)    Reaction:  Unknown   . Valium [Diazepam] Other (See Comments)    Reaction:  Unknown       Medication List  Accurate as of 01/03/16  6:46 PM. Always use your most recent med list.          acetaminophen 325 MG tablet Commonly known as:  TYLENOL Take 650 mg by mouth every 6 (six) hours as needed for mild pain or moderate pain.   aspirin 81 MG chewable tablet Chew 81 mg by mouth daily.   atorvastatin 10 MG tablet Commonly known as:  LIPITOR Take 10 mg by mouth every evening.   calcium-vitamin D 500-200 MG-UNIT tablet Commonly known as:  OSCAL WITH D Take 1 tablet by mouth 2 (two) times daily.   CERTAVITE SENIOR/ANTIOXIDANT PO Take 1 tablet by mouth daily.   famotidine 20 MG tablet Commonly known as:  PEPCID Take 20 mg by mouth at bedtime.   feeding supplement Liqd Take 1 Container by mouth 3 (three) times  daily between meals.   isosorbide mononitrate 30 MG 24 hr tablet Commonly known as:  IMDUR Take 30 mg by mouth daily.   LORazepam 0.5 MG tablet Commonly known as:  ATIVAN Take 0.5 mg by mouth every 4 (four) hours as needed for anxiety.   memantine 10 MG tablet Commonly known as:  NAMENDA Take 10 mg by mouth 2 (two) times daily.   metoprolol succinate 25 MG 24 hr tablet Commonly known as:  TOPROL-XL Take 25 mg by mouth at bedtime.   polyethylene glycol packet Commonly known as:  MIRALAX / GLYCOLAX Take 17 g by mouth daily.   sertraline 100 MG tablet Commonly known as:  ZOLOFT Take 100 mg by mouth every morning.       Review of Systems  Constitutional: Negative for diaphoresis and fever.  HENT: Negative for congestion and hearing loss.   Eyes: Negative for pain and discharge.  Respiratory: Negative for cough, shortness of breath and wheezing.   Cardiovascular: Negative for chest pain, palpitations and leg swelling.  Gastrointestinal: Negative for abdominal pain, constipation, diarrhea and vomiting.  Genitourinary: Positive for urgency. Negative for dysuria and frequency.  Musculoskeletal: Positive for back pain. Negative for myalgias and neck pain.       S/p Left thumb and left ankle fxs, s/p Ortho, WBAT. S/p right wrist fx, splint, right hand swelling, warmth to touch, making a fist with pain. Left fore arm pain.   Skin: Negative for rash.  Neurological: Negative for seizures and headaches.       Dementia. 09/10/12 MMSE; total 19/30   Psychiatric/Behavioral: Positive for confusion, decreased concentration and dysphoric mood. Negative for hallucinations and suicidal ideas. The patient is nervous/anxious.     Immunization History  Administered Date(s) Administered  . Influenza Whole 02/24/2012, 02/23/2013  . Influenza-Unspecified 03/04/2014, 01/08/2015  . PPD Test 06/12/2014  . Pneumococcal Polysaccharide-23 04/20/2008  . Td 04/21/2003   Pertinent  Health Maintenance  Due  Topic Date Due  . DEXA SCAN  06/26/1991  . PNA vac Low Risk Adult (2 of 2 - PCV13) 04/20/2009  . INFLUENZA VACCINE  11/19/2015   Fall Risk  03/11/2015  Falls in the past year? No  Risk for fall due to : Impaired balance/gait   Functional Status Survey:    Vitals:   01/03/16 1221  BP: 124/80  Pulse: 74  Resp: 20  Temp: 97.2 F (36.2 C)  Weight: 110 lb 3.2 oz (50 kg)  Height: 5\' 2"  (1.575 m)   Body mass index is 20.16 kg/m. Physical Exam  Constitutional: She appears well-developed and well-nourished.  HENT:  Head: Normocephalic and atraumatic.  Eyes: EOM are  normal. Pupils are equal, round, and reactive to light. Right eye exhibits no discharge. Left eye exhibits no discharge.  Neck: Normal range of motion. Neck supple. No JVD present. No thyromegaly present.  Cardiovascular: Normal rate and regular rhythm.   No murmur heard. Pulmonary/Chest: No respiratory distress. She has no wheezes. She has no rales.  Abdominal: Soft. Bowel sounds are normal. She exhibits no distension. There is no tenderness.  Musculoskeletal: Normal range of motion. She exhibits tenderness. She exhibits no edema.  S/p Left thumb and left ankle fxs, s/p Ortho, WBAT. S/p right wrist fx, splint, right hand swelling, warmth to touch, making a fist with pain. C/o left forearm pain.    Lymphadenopathy:    She has no cervical adenopathy.  Neurological: She is alert. She has normal reflexes. No cranial nerve deficit. She exhibits normal muscle tone. Coordination normal.  Skin: Skin is warm and dry. No rash noted. No erythema.  Healed site of excisional biopsy of skin cancer of right forehead. 1.25 inch scar.  Psychiatric:  persisted confusion, pacing, restlessness, not sleep much at night     Labs reviewed:  Recent Labs  05/28/15 10/08/15 12/03/15  NA 142 142 145  K 4.1 4.2 4.4  BUN 23* 30* 30*  CREATININE 0.8 1.1 1.0    Recent Labs  05/28/15 10/08/15 12/03/15  AST 18 28 20   ALT 8 23 11     ALKPHOS 75 106 69    Recent Labs  05/28/15 10/08/15 12/03/15  WBC 7.0 9.2 7.8  HGB 12.2 11.4* 11.1*  HCT 37 34* 33*  PLT 177 229 199   Lab Results  Component Value Date   TSH 2.58 12/03/2015   No results found for: HGBA1C No results found for: CHOL, HDL, LDLCALC, LDLDIRECT, TRIG, CHOLHDL  Significant Diagnostic Results in last 30 days:  Dg Wrist Complete Right  Result Date: 12/09/2015 CLINICAL DATA:  Fall, right wrist pain/deformity EXAM: RIGHT WRIST - COMPLETE 3+ VIEW COMPARISON:  None. FINDINGS: Mildly displaced distal radial fracture with apex volar angulation, comminuted. Mildly displaced distal ulnar fracture, likely with apex volar angulation. Associated moderate soft tissue swelling. IMPRESSION: Mildly displaced distal radial and ulnar fractures, as above. Electronically Signed   By: Charline Bills M.D.   On: 12/09/2015 20:58    Assessment/Plan There are no diagnoses linked to this encounter.Essential hypertension, benign Controlled, continue Metoprolol 25mg  daily. 12/03/15 Hgb 11.1, Na 145, K 4.4, Bun 30, creat 0.96, TP 5.7, albumin 3.3, TSH 2.58    CAD (coronary artery disease) of artery bypass graft No c/o angina since last visited. Continue Isosorbide 30mg  daily, Atorvastatin 10mg , ASA 81mg    GERD (gastroesophageal reflux disease) Stable. Continue Famotidine 20mg  daily.    Constipation Stable. Continue MiraLax daily.   Dementia 09/2013 MMSE 10/30 04/24/14 MMSE 8/30 Progressing, continue Namenda for now, Lorazepam prn for agitation, update CBC, CMP, TSH Lack of safety awareness and increased frailty/gait problem contributory to her falling 12/03/15 Hgb 11.1, Na 145, K 4.4, Bun 30, creat 0.96, TP 5.7, albumin 3.3, TSH 2.58    Depression due to dementia 07/16/14 crying episodes, poor appetite, lethargy-labs unremarkable-repeat UA C/S and starts Sertraline 25mg  daily. Off Mirtazapine.  Improved mood, anxiety, agitation, and attempts to leave facility,  continue Sertraline 100mg  daily    History of fall Frequent falling, lack of safety awareness and increased frailty contributory, intensive supervision for safety.   Adult failure to thrive Continue to decline physically, cognitively, weight loss, ADL dependency, frequent falling. Continue safety supervision, supportive care.  Family/ staff Communication: continue SNF [facility] for care needs, risk falling.   Labs/tests ordered:  None

## 2016-01-03 NOTE — Assessment & Plan Note (Signed)
Frequent falling, lack of safety awareness and increased frailty contributory, intensive supervision for safety.

## 2016-01-03 NOTE — Assessment & Plan Note (Signed)
09/2013 MMSE 10/30 04/24/14 MMSE 8/30 Progressing, continue Namenda for now, Lorazepam prn for agitation, update CBC, CMP, TSH Lack of safety awareness and increased frailty/gait problem contributory to her falling 12/03/15 Hgb 11.1, Na 145, K 4.4, Bun 30, creat 0.96, TP 5.7, albumin 3.3, TSH 2.58      

## 2016-01-03 NOTE — Assessment & Plan Note (Signed)
No c/o angina since last visited. Continue Isosorbide 30mg daily, Atorvastatin 10mg, ASA 81mg 

## 2016-01-09 LAB — LIPID PANEL
CHOLESTEROL: 131 mg/dL (ref 0–200)
HDL: 54 mg/dL (ref 35–70)
LDL CALC: 50 mg/dL
LDL/HDL RATIO: 2.4
TRIGLYCERIDES: 135 mg/dL (ref 40–160)

## 2016-01-10 ENCOUNTER — Other Ambulatory Visit: Payer: Self-pay | Admitting: *Deleted

## 2016-01-31 ENCOUNTER — Non-Acute Institutional Stay (SKILLED_NURSING_FACILITY): Payer: Medicare Other | Admitting: Nurse Practitioner

## 2016-01-31 ENCOUNTER — Encounter: Payer: Self-pay | Admitting: Nurse Practitioner

## 2016-01-31 DIAGNOSIS — M150 Primary generalized (osteo)arthritis: Secondary | ICD-10-CM

## 2016-01-31 DIAGNOSIS — M159 Polyosteoarthritis, unspecified: Secondary | ICD-10-CM

## 2016-01-31 DIAGNOSIS — I10 Essential (primary) hypertension: Secondary | ICD-10-CM

## 2016-01-31 DIAGNOSIS — F329 Major depressive disorder, single episode, unspecified: Secondary | ICD-10-CM | POA: Diagnosis not present

## 2016-01-31 DIAGNOSIS — K5900 Constipation, unspecified: Secondary | ICD-10-CM

## 2016-01-31 DIAGNOSIS — F0280 Dementia in other diseases classified elsewhere without behavioral disturbance: Secondary | ICD-10-CM

## 2016-01-31 DIAGNOSIS — K219 Gastro-esophageal reflux disease without esophagitis: Secondary | ICD-10-CM

## 2016-01-31 DIAGNOSIS — I25700 Atherosclerosis of coronary artery bypass graft(s), unspecified, with unstable angina pectoris: Secondary | ICD-10-CM

## 2016-01-31 DIAGNOSIS — F0393 Unspecified dementia, unspecified severity, with mood disturbance: Secondary | ICD-10-CM

## 2016-01-31 DIAGNOSIS — I200 Unstable angina: Secondary | ICD-10-CM | POA: Diagnosis not present

## 2016-01-31 DIAGNOSIS — F0150 Vascular dementia without behavioral disturbance: Secondary | ICD-10-CM | POA: Diagnosis not present

## 2016-01-31 NOTE — Assessment & Plan Note (Signed)
09/2013 MMSE 10/30 04/24/14 MMSE 8/30 Progressing, continue Namenda for now, Lorazepam prn for agitation Lack of safety awareness and increased frailty/gait problem contributory to her falling 12/03/15 Hgb 11.1, Na 145, K 4.4, Bun 30, creat 0.96, TP 5.7, albumin 3.3, TSH 2.58

## 2016-01-31 NOTE — Assessment & Plan Note (Signed)
No c/o angina since last visited. Continue Isosorbide 30mg daily, Atorvastatin 10mg, ASA 81mg 

## 2016-01-31 NOTE — Assessment & Plan Note (Signed)
Stable. Continue MiraLax daily.  

## 2016-01-31 NOTE — Assessment & Plan Note (Signed)
Controlled, continue Metoprolol 25mg daily. 12/03/15 Hgb 11.1, Na 145, K 4.4, Bun 30, creat 0.96, TP 5.7, albumin 3.3, TSH 2.58  

## 2016-01-31 NOTE — Progress Notes (Signed)
Location:  [facility] Room Number: 100 Place of Service:  SNF (31) Provider:  Geneva Barrero, Manxie  NP  Murray Hodgkins, MD  Patient Care Team: Kimber Relic, MD as PCP - General (Internal Medicine) [facility] Kasee Hantz Johnney Ou, NP as Nurse Practitioner (Nurse Practitioner)  Extended Emergency Contact Information Primary Emergency Contact: [facility] Address: [address] [place]          [name],  [address] Home Phone: [phone] Relation: None  Code Status:  DNR Goals of care: Advanced Directive information Advanced Directives 01/31/2016  Does patient have an advance directive? Yes  Type of Advance Directive Out of facility DNR (pink MOST or yellow form)  Does patient want to make changes to advanced directive? No - Patient declined  Copy of advanced directive(s) in chart? Yes  Pre-existing out of facility DNR order (yellow form or pink MOST form) -     Chief Complaint  Patient presents with  . Medical Management of Chronic Issues    HPI:  Pt is a 82 y.o. female seen today for medical management of chronic diseases.    S/p right displaced radial and ulnar fxs, left ankle x, left thumb fx sustained from falling.              Hx of dementia, resides in [facility] SNF, taking Namenda for memory, depression, better with Sertraline, HTN, controlled on Metoprolol and Imdur, constipation, stable on MirLax daily, GERD, stable while on Famotidine 20mg , FTT supportive measurs.   Past Medical History:  Diagnosis Date  . Adjustment disorder with depressed mood   . Anemia   . Asthma   . Constipation   . Coronary atherosclerosis of native coronary artery   . Dementia in conditions classified elsewhere without behavioral disturbance   . Depression   . DJD (degenerative joint disease)   . Edema   . GERD (gastroesophageal reflux disease)   . History of fall   . Hyperlipidemia   . Hypertension   . Insomnia   . Osteoporosis   . Pain in joint, pelvic region and  thigh    left  . Right bundle branch block   . Unspecified constipation   . Unspecified urinary incontinence   . Urinary tract infection, site not specified    Past Surgical History:  Procedure Laterality Date  . ABDOMINAL HYSTERECTOMY    . CHOLECYSTECTOMY    . FEMUR IM NAIL  06/19/2011   Procedure: INTRAMEDULLARY (IM) NAIL FEMORAL;  Surgeon: Harvie Junior, MD;  Location: [facility] OR;  Service: Orthopedics;  Laterality: Left;  . SKIN CANCER EXCISION  02/15/2013   right forehead Dr. Irene Limbo    Allergies  Allergen Reactions  . Alendronate Sodium Other (See Comments)    Reaction:  Unknown   . Asmanex 120 Metered Doses [Mometasone Furoate] Other (See Comments)    Reaction:  Unknown   . Budesonide-Formoterol Fumarate Other (See Comments)    Reaction:  Unknown   . Fluticasone Propionate (Inhal) Other (See Comments)    Reaction:  Unknown   . Mometasone Furoate Other (See Comments)    Reaction:  Unknown   . Omeprazole Other (See Comments)    Reaction:  Unknown   . Valium [Diazepam] Other (See Comments)    Reaction:  Unknown       Medication List       Accurate as of 01/31/16  3:26 PM. Always use your most recent med list.          acetaminophen  325 MG tablet Commonly known as:  TYLENOL Take 650 mg by mouth every 6 (six) hours as needed for mild pain or moderate pain.   aspirin 81 MG chewable tablet Chew 81 mg by mouth daily.   atorvastatin 10 MG tablet Commonly known as:  LIPITOR Take 10 mg by mouth every evening.   calcium-vitamin D 500-200 MG-UNIT tablet Commonly known as:  OSCAL WITH D Take 1 tablet by mouth 2 (two) times daily.   CERTAVITE SENIOR/ANTIOXIDANT PO Take 1 tablet by mouth daily.   famotidine 20 MG tablet Commonly known as:  PEPCID Take 20 mg by mouth at bedtime.   feeding supplement Liqd Take 1 Container by mouth 3 (three) times daily between meals.   isosorbide mononitrate 30 MG 24 hr tablet Commonly known as:  IMDUR Take 30 mg by mouth  daily.   LORazepam 0.5 MG tablet Commonly known as:  ATIVAN Take 0.5 mg by mouth every 4 (four) hours as needed for anxiety.   memantine 10 MG tablet Commonly known as:  NAMENDA Take 10 mg by mouth 2 (two) times daily.   metoprolol succinate 25 MG 24 hr tablet Commonly known as:  TOPROL-XL Take 25 mg by mouth at bedtime.   polyethylene glycol packet Commonly known as:  MIRALAX / GLYCOLAX Take 17 g by mouth daily.   sertraline 100 MG tablet Commonly known as:  ZOLOFT Take 100 mg by mouth every morning.       Review of Systems  Constitutional: Negative for diaphoresis and fever.  HENT: Negative for congestion and hearing loss.   Eyes: Negative for pain and discharge.  Respiratory: Negative for cough, shortness of breath and wheezing.   Cardiovascular: Negative for chest pain, palpitations and leg swelling.  Gastrointestinal: Negative for abdominal pain, constipation, diarrhea and vomiting.  Genitourinary: Negative for dysuria, frequency and urgency.  Musculoskeletal: Positive for arthralgias and back pain. Negative for myalgias and neck pain.       S/p Left thumb and left ankle fxs, s/p Ortho, WBAT. S/p right wrist fx, healing nicely.   Skin: Negative for rash.  Neurological: Negative for seizures and headaches.       Dementia. 09/10/12 MMSE; total 19/30   Psychiatric/Behavioral: Positive for confusion, decreased concentration and dysphoric mood. Negative for hallucinations and suicidal ideas. The patient is nervous/anxious.     Immunization History  Administered Date(s) Administered  . Influenza Whole 02/24/2012, 02/23/2013  . Influenza-Unspecified 03/04/2014, 01/08/2015  . PPD Test 06/12/2014  . Pneumococcal Polysaccharide-23 04/20/2008  . Td 04/21/2003   Pertinent  Health Maintenance Due  Topic Date Due  . DEXA SCAN  06/26/1991  . PNA vac Low Risk Adult (2 of 2 - PCV13) 04/20/2009  . INFLUENZA VACCINE  11/19/2015   Fall Risk  03/11/2015  Falls in the past  year? No  Risk for fall due to : Impaired balance/gait   Functional Status Survey:    Vitals:   01/31/16 1039  BP: 122/78  Pulse: 76  Resp: 20  Temp: 97.2 F (36.2 C)  Weight: 107 lb 3.2 oz (48.6 kg)  Height: 5\' 2"  (1.575 m)   Body mass index is 19.61 kg/m. Physical Exam  Constitutional: She appears well-developed and well-nourished.  HENT:  Head: Normocephalic and atraumatic.  Eyes: EOM are normal. Pupils are equal, round, and reactive to light. Right eye exhibits no discharge. Left eye exhibits no discharge.  Neck: Normal range of motion. Neck supple. No JVD present. No thyromegaly present.  Cardiovascular: Normal rate and  regular rhythm.   No murmur heard. Pulmonary/Chest: No respiratory distress. She has no wheezes. She has no rales.  Abdominal: Soft. Bowel sounds are normal. She exhibits no distension. There is no tenderness.  Musculoskeletal: Normal range of motion. She exhibits tenderness. She exhibits no edema.  S/p Left thumb and left ankle fxs, s/p Ortho, WBAT. S/p right wrist fx, healing nicely.    Lymphadenopathy:    She has no cervical adenopathy.  Neurological: She is alert. She has normal reflexes. No cranial nerve deficit. She exhibits normal muscle tone. Coordination normal.  Skin: Skin is warm and dry. No rash noted. No erythema.  Healed site of excisional biopsy of skin cancer of right forehead. 1.25 inch scar.  Psychiatric:  persisted confusion, pacing, restlessness, not sleep much at night     Labs reviewed:  Recent Labs  05/28/15 10/08/15 12/03/15  NA 142 142 145  K 4.1 4.2 4.4  BUN 23* 30* 30*  CREATININE 0.8 1.1 1.0    Recent Labs  05/28/15 10/08/15 12/03/15  AST 18 28 20   ALT 8 23 11   ALKPHOS 75 106 69    Recent Labs  05/28/15 10/08/15 12/03/15  WBC 7.0 9.2 7.8  HGB 12.2 11.4* 11.1*  HCT 37 34* 33*  PLT 177 229 199   Lab Results  Component Value Date   TSH 2.58 12/03/2015   No results found for: HGBA1C Lab Results   Component Value Date   CHOL 131 01/09/2016   HDL 54 01/09/2016   LDLCALC 50 01/09/2016   TRIG 135 01/09/2016    Significant Diagnostic Results in last 30 days:  No results found.  Assessment/Plan Essential hypertension, benign Controlled, continue Metoprolol 25mg  daily. 12/03/15 Hgb 11.1, Na 145, K 4.4, Bun 30, creat 0.96, TP 5.7, albumin 3.3, TSH 2.58     CAD (coronary artery disease) of artery bypass graft No c/o angina since last visited. Continue Isosorbide 30mg  daily, Atorvastatin 10mg , ASA 81mg    GERD (gastroesophageal reflux disease) Stable. Continue Famotidine 20mg  daily.  Constipation Stable. Continue MiraLax daily.    Dementia 09/2013 MMSE 10/30 04/24/14 MMSE 8/30 Progressing, continue Namenda for now, Lorazepam prn for agitation, Lack of safety awareness and increased frailty/gait problem contributory to her falling 12/03/15 Hgb 11.1, Na 145, K 4.4, Bun 30, creat 0.96, TP 5.7, albumin 3.3, TSH 2.58     Depression due to dementia 07/16/14 crying episodes, poor appetite, lethargy-labs unremarkable-repeat UA C/S and starts Sertraline 25mg  daily. Off Mirtazapine.  Improved mood, anxiety, agitation, and attempts to leave facility, continue Sertraline 100mg  daily    Dementia due to medical condition without behavioral disturbance 09/2013 MMSE 10/30 04/24/14 MMSE 8/30 Progressing, continue Namenda for now, Lorazepam prn for agitation Lack of safety awareness and increased frailty/gait problem contributory to her falling 12/03/15 Hgb 11.1, Na 145, K 4.4, Bun 30, creat 0.96, TP 5.7, albumin 3.3, TSH 2.58     DJD (degenerative joint disease) Multiple joints, mild, continue prn Tylenol.      Family/ staff Communication: continue SNF [facility] for care assistance.   Labs/tests ordered: none

## 2016-01-31 NOTE — Assessment & Plan Note (Signed)
07/16/14 crying episodes, poor appetite, lethargy-labs unremarkable-repeat UA C/S and starts Sertraline 25mg  daily. Off Mirtazapine.  Improved mood, anxiety, agitation, and attempts to leave facility, continue Sertraline 100mg  daily

## 2016-01-31 NOTE — Assessment & Plan Note (Signed)
Stable. Continue Famotidine 20mg daily.  

## 2016-01-31 NOTE — Assessment & Plan Note (Signed)
09/2013 MMSE 10/30 04/24/14 MMSE 8/30 Progressing, continue Namenda for now, Lorazepam prn for agitation, Lack of safety awareness and increased frailty/gait problem contributory to her falling 12/03/15 Hgb 11.1, Na 145, K 4.4, Bun 30, creat 0.96, TP 5.7, albumin 3.3, TSH 2.58

## 2016-01-31 NOTE — Assessment & Plan Note (Signed)
Multiple joints, mild, continue prn Tylenol.

## 2016-03-02 ENCOUNTER — Encounter: Payer: Self-pay | Admitting: Nurse Practitioner

## 2016-03-02 NOTE — Progress Notes (Signed)
This encounter was created in error - please disregard.

## 2016-03-26 ENCOUNTER — Encounter: Payer: Self-pay | Admitting: Nurse Practitioner

## 2016-03-26 ENCOUNTER — Non-Acute Institutional Stay (SKILLED_NURSING_FACILITY): Payer: Medicare Other | Admitting: Nurse Practitioner

## 2016-03-26 DIAGNOSIS — R627 Adult failure to thrive: Secondary | ICD-10-CM

## 2016-03-26 DIAGNOSIS — I10 Essential (primary) hypertension: Secondary | ICD-10-CM

## 2016-03-26 DIAGNOSIS — F329 Major depressive disorder, single episode, unspecified: Secondary | ICD-10-CM

## 2016-03-26 DIAGNOSIS — M1612 Unilateral primary osteoarthritis, left hip: Secondary | ICD-10-CM

## 2016-03-26 DIAGNOSIS — F0280 Dementia in other diseases classified elsewhere without behavioral disturbance: Secondary | ICD-10-CM | POA: Diagnosis not present

## 2016-03-26 DIAGNOSIS — F0150 Vascular dementia without behavioral disturbance: Secondary | ICD-10-CM | POA: Diagnosis not present

## 2016-03-26 DIAGNOSIS — K5900 Constipation, unspecified: Secondary | ICD-10-CM | POA: Diagnosis not present

## 2016-03-26 DIAGNOSIS — K219 Gastro-esophageal reflux disease without esophagitis: Secondary | ICD-10-CM

## 2016-03-26 DIAGNOSIS — F0393 Unspecified dementia, unspecified severity, with mood disturbance: Secondary | ICD-10-CM

## 2016-03-26 NOTE — Assessment & Plan Note (Signed)
Continue to decline physically, cognitively, weight loss, ADL dependency, frequent falling. Continue safety supervision, supportive care.

## 2016-03-26 NOTE — Progress Notes (Signed)
[facility]:  [facility] Room Number: [phone] Place of Service:  SNF (31) Provider:  Lyvia Mondesir, Manxie  NP  Murray HodgkinsArthur Green, MD  Patient Care Team: Kimber RelicArthur G Green, MD as PCP - General (Internal Medicine) [facility] Tysen Roesler Johnney OuX Manali Mcelmurry, NP as Nurse Practitioner (Nurse Practitioner)  Extended Emergency Contact Information Primary Emergency Contact: [facility] Address: [address]          [place],  [id number] Home Phone: [phone] Relation: None  Code Status:  DNR Goals of care: Advanced Directive information Advanced Directives 03/26/2016  Does Patient Have a Medical Advance Directive? Yes  Type of Advance Directive Out of facility DNR (pink MOST or yellow form)  Does patient want to make changes to medical advance directive? No - Patient declined  Copy of Healthcare Power of Attorney in Chart? Yes  Pre-existing out of facility DNR order (yellow form or pink MOST form) -     Chief Complaint  Patient presents with  . Acute Visit    Larey SeatFell on 12/5, Pain in (L) leg, hip, pelvic12/6    HPI:  Pt is a 82 y.o. female seen today for an acute visit for fall, pain in the left hip, X-ray 03/24/16 showed no acute fx or dislocation, old fx of mid left femur with intramedullary rod and screw fixation.     Hx of dementia, [facility], taking Namenda for memory, depression, better with Sertraline, HTN, controlled on Metoprolol and Imdur, constipation, stable on MirLax daily, GERD, stable while on Famotidine 20mg , FTT supportive measurs.  Hx of right displaced radial and ulnar fxs, left ankle x, left thumb fx sustained from falling.  Past Medical History:  Diagnosis Date  . Adjustment disorder with depressed mood   . Anemia   . Asthma   . Constipation   . Coronary atherosclerosis of native coronary artery   . Dementia in conditions classified elsewhere without behavioral disturbance   . Depression   . DJD (degenerative joint disease)   . Edema   . GERD  (gastroesophageal reflux disease)   . History of fall   . Hyperlipidemia   . Hypertension   . Insomnia   . Osteoporosis   . Pain in joint, pelvic region and thigh    left  . Right bundle branch block   . Unspecified constipation   . Unspecified urinary incontinence   . Urinary tract infection, site not specified    Past Surgical History:  Procedure Laterality Date  . ABDOMINAL HYSTERECTOMY    . CHOLECYSTECTOMY    . FEMUR IM NAIL  06/19/2011   Procedure: INTRAMEDULLARY (IM) NAIL FEMORAL;  Surgeon: Harvie JuniorJohn L Graves, MD;  Location: [facility] OR;  Service: Orthopedics;  Laterality: Left;  . SKIN CANCER EXCISION  02/15/2013   right forehead Dr. Irene LimboGoodrich    Allergies  Allergen Reactions  . Alendronate Sodium Other (See Comments)    Reaction:  Unknown   . Asmanex 120 Metered Doses [Mometasone Furoate] Other (See Comments)    Reaction:  Unknown   . Budesonide-Formoterol Fumarate Other (See Comments)    Reaction:  Unknown   . Fluticasone Propionate (Inhal) Other (See Comments)    Reaction:  Unknown   . Mometasone Furoate Other (See Comments)    Reaction:  Unknown   . Omeprazole Other (See Comments)    Reaction:  Unknown   . Valium [Diazepam] Other (See Comments)    Reaction:  Unknown       Medication List  Accurate as of 03/26/16  4:01 PM. Always use your most recent med list.          acetaminophen 325 MG tablet Commonly known as:  TYLENOL Take 650 mg by mouth every 6 (six) hours as needed for mild pain or moderate pain.   aspirin 81 MG chewable tablet Chew 81 mg by mouth daily.   atorvastatin 10 MG tablet Commonly known as:  LIPITOR Take 10 mg by mouth every evening.   calcium-vitamin D 500-200 MG-UNIT tablet Commonly known as:  OSCAL WITH D Take 1 tablet by mouth 2 (two) times daily.   CERTAVITE SENIOR/ANTIOXIDANT PO Take 1 tablet by mouth daily.   famotidine 20 MG tablet Commonly known as:  PEPCID Take 20 mg by mouth at bedtime.   feeding supplement  Liqd Take 1 Container by mouth 3 (three) times daily between meals.   memantine 10 MG tablet Commonly known as:  NAMENDA Take 10 mg by mouth 2 (two) times daily.   metoprolol tartrate 12.5 mg Tabs tablet Commonly known as:  LOPRESSOR Take 12.5 mg by mouth 2 (two) times daily.   polyethylene glycol packet Commonly known as:  MIRALAX / GLYCOLAX Take 17 g by mouth daily.   sertraline 100 MG tablet Commonly known as:  ZOLOFT Take 100 mg by mouth every morning.       Review of Systems  Constitutional: Negative for diaphoresis and fever.  HENT: Negative for congestion and hearing loss.   Eyes: Negative for pain and discharge.  Respiratory: Negative for cough, shortness of breath and wheezing.   Cardiovascular: Negative for chest pain, palpitations and leg swelling.  Gastrointestinal: Negative for abdominal pain, constipation, diarrhea and vomiting.  Genitourinary: Negative for dysuria, frequency and urgency.  Musculoskeletal: Positive for arthralgias and back pain. Negative for myalgias and neck pain.       S/p Left thumb and left ankle fxs, s/p Ortho, WBAT. S/p right wrist fx, healing nicely.   Skin: Negative for rash.  Neurological: Negative for seizures and headaches.       Dementia. 09/10/12 MMSE; total 19/30   Psychiatric/Behavioral: Positive for confusion, decreased concentration and dysphoric mood. Negative for hallucinations and suicidal ideas. The patient is nervous/anxious.     Immunization History  Administered Date(s) Administered  . Influenza Whole 02/24/2012, 02/23/2013  . Influenza-Unspecified 03/04/2014, 01/08/2015, 02/04/2016  . PPD Test 06/12/2014  . Pneumococcal Polysaccharide-23 04/20/2008  . Td 04/21/2003   Pertinent  Health Maintenance Due  Topic Date Due  . DEXA SCAN  06/26/1991  . PNA vac Low Risk Adult (2 of 2 - PCV13) 04/20/2009  . INFLUENZA VACCINE  Completed   Fall Risk  03/11/2015  Falls in the past year? No  Risk for fall due to : Impaired  balance/gait   Functional Status Survey:    Vitals:   03/26/16 1510  BP: 116/60  Pulse: 62  Resp: 18  Temp: 97.7 F (36.5 C)  Weight: 103 lb 12.8 oz (47.1 kg)  Height: 5\' 2"  (1.575 m)   Body mass index is 18.99 kg/m. Physical Exam  Constitutional: She appears well-developed and well-nourished.  HENT:  Head: Normocephalic and atraumatic.  Eyes: EOM are normal. Pupils are equal, round, and reactive to light. Right eye exhibits no discharge. Left eye exhibits no discharge.  Neck: Normal range of motion. Neck supple. No JVD present. No thyromegaly present.  Cardiovascular: Normal rate and regular rhythm.   No murmur heard. Pulmonary/Chest: No respiratory distress. She has no wheezes. She has no  rales.  Abdominal: Soft. Bowel sounds are normal. She exhibits no distension. There is no tenderness.  Musculoskeletal: Normal range of motion. She exhibits tenderness. She exhibits no edema.  S/p Left thumb and left ankle fxs, s/p Ortho, WBAT. S/p right wrist fx, healing nicely.    Lymphadenopathy:    She has no cervical adenopathy.  Neurological: She is alert. She has normal reflexes. No cranial nerve deficit. She exhibits normal muscle tone. Coordination normal.  Skin: Skin is warm and dry. No rash noted. No erythema.  Healed site of excisional biopsy of skin cancer of right forehead. 1.25 inch scar.  Psychiatric:  persisted confusion, pacing, restlessness, not sleep much at night     Labs reviewed:  Recent Labs  05/28/15 10/08/15 12/03/15  NA 142 142 145  K 4.1 4.2 4.4  BUN 23* 30* 30*  CREATININE 0.8 1.1 1.0    Recent Labs  05/28/15 10/08/15 12/03/15  AST 18 28 20   ALT 8 23 11   ALKPHOS 75 106 69    Recent Labs  05/28/15 10/08/15 12/03/15  WBC 7.0 9.2 7.8  HGB 12.2 11.4* 11.1*  HCT 37 34* 33*  PLT 177 229 199   Lab Results  Component Value Date   TSH 2.58 12/03/2015   No results found for: HGBA1C Lab Results  Component Value Date   CHOL 131 01/09/2016    HDL 54 01/09/2016   LDLCALC 50 01/09/2016   TRIG 135 01/09/2016    Significant Diagnostic Results in last 30 days:  No results found.  Assessment/Plan Osteoarthritis of left hip fall, pain in the left hip, X-ray 03/24/16 showed no acute fx or dislocation, old fx of mid left femur with intramedullary rod and screw fixation. Prn Tylenol available to her.     Essential hypertension, benign Controlled, continue Metoprolol 25mg  daily.   GERD (gastroesophageal reflux disease) Stable. Continue Famotidine 20mg  daily.   Constipation Stable. Continue MiraLax daily.     Dementia Progressing, continue Namenda for now, Lorazepam prn for agitation, Lack of safety awareness and increased frailty/gait problem contributory to her falling   Depression due to dementia Improved mood, anxiety, agitation, and attempts to leave facility, continue Sertraline 100mg  daily     Dementia due to medical condition without behavioral disturbance Progressing, continue Namenda for now,  Lack of safety awareness and increased frailty/gait problem contributory to her falling   Adult failure to thrive Continue to decline physically, cognitively, weight loss, ADL dependency, frequent falling. Continue safety supervision, supportive care.         Family/ staff Communication: SNF   Labs/tests ordered:  X-ray 03/24/16 left hip

## 2016-03-26 NOTE — Assessment & Plan Note (Signed)
Progressing, continue Namenda for now, Lorazepam prn for agitation, Lack of safety awareness and increased frailty/gait problem contributory to her falling

## 2016-03-26 NOTE — Assessment & Plan Note (Signed)
Controlled, continue Metoprolol 25mg daily 

## 2016-03-26 NOTE — Assessment & Plan Note (Signed)
Improved mood, anxiety, agitation, and attempts to leave facility, continue Sertraline 100mg  daily

## 2016-03-26 NOTE — Assessment & Plan Note (Signed)
Stable. Continue Famotidine 20mg daily.  

## 2016-03-26 NOTE — Assessment & Plan Note (Signed)
Stable. Continue MiraLax daily.  

## 2016-03-26 NOTE — Assessment & Plan Note (Signed)
fall, pain in the left hip, X-ray 03/24/16 showed no acute fx or dislocation, old fx of mid left femur with intramedullary rod and screw fixation. Prn Tylenol available to her.

## 2016-03-26 NOTE — Assessment & Plan Note (Signed)
Progressing, continue Namenda for now,  Lack of safety awareness and increased frailty/gait problem contributory to her falling

## 2016-03-31 LAB — BASIC METABOLIC PANEL
BUN: 30 mg/dL — AB (ref 4–21)
CREATININE: 1.2 mg/dL — AB (ref <=1.1)
GLUCOSE: 94 mg/dL
Potassium: 4.5 mmol/L (ref 3.4–5.3)
Sodium: 145 mmol/L (ref 137–147)

## 2016-03-31 LAB — HEPATIC FUNCTION PANEL
ALK PHOS: 69 U/L (ref 25–125)
ALT: 9 U/L (ref 7–35)
AST: 18 U/L (ref 13–35)
BILIRUBIN, TOTAL: 0.3 mg/dL

## 2016-03-31 LAB — TSH: TSH: 2.87 u[IU]/mL (ref <=5.90)

## 2016-04-01 ENCOUNTER — Other Ambulatory Visit: Payer: Self-pay | Admitting: *Deleted

## 2016-04-02 ENCOUNTER — Encounter: Payer: Self-pay | Admitting: Internal Medicine

## 2016-04-02 ENCOUNTER — Non-Acute Institutional Stay (SKILLED_NURSING_FACILITY): Payer: Medicare Other | Admitting: Internal Medicine

## 2016-04-02 DIAGNOSIS — R634 Abnormal weight loss: Secondary | ICD-10-CM

## 2016-04-02 DIAGNOSIS — R627 Adult failure to thrive: Secondary | ICD-10-CM

## 2016-04-02 DIAGNOSIS — I10 Essential (primary) hypertension: Secondary | ICD-10-CM

## 2016-04-02 DIAGNOSIS — E785 Hyperlipidemia, unspecified: Secondary | ICD-10-CM | POA: Diagnosis not present

## 2016-04-02 DIAGNOSIS — F0150 Vascular dementia without behavioral disturbance: Secondary | ICD-10-CM

## 2016-04-02 NOTE — Progress Notes (Signed)
Progress Note    Location:  [facility] Room Number: [id number] Place of Service:  SNF [phone](31) Provider:  Murray HodgkinsArthur Kolter Reaver, MD  Patient Care Team: Kimber RelicArthur G Hardy Harcum, MD as PCP - General (Internal Medicine) [facility] Man Johnney OuX Mast, NP as Nurse Practitioner (Nurse Practitioner)  Extended Emergency Contact Information [facility] Address: [address]          [place],  [id number] Home Phone: [phone] Relation: None  Code Status:  DNR Goals of care: Advanced Directive information Advanced Directives 04/02/2016  Does Patient Have a Medical Advance Directive? Yes  Type of Advance Directive Living will;Out of facility DNR (pink MOST or yellow form)  Does patient want to make changes to medical advance directive? -  Copy of Healthcare Power of Attorney in Chart? -  Pre-existing out of facility DNR order (yellow form or pink MOST form) Yellow form placed in chart (order not valid for inpatient use);Pink MOST form placed in chart (order not valid for inpatient use)     Chief Complaint  Patient presents with  . Medical Management of Chronic Issues    routine visit    HPI:  Pt is a 82 y.o. female seen today for medical management of chronic diseases.    Vascular dementia without behavioral disturbance - unchanged  Adult failure to thrive - weight has fallen steadily for the last 2 years from 154# to 106#. She is eating poorly. Spends a lot of time sleeping  Essential hypertension, benign - controlled. With weight loss, she may not need as much medication.      Past Medical History:  Diagnosis Date  . Adjustment disorder with depressed mood   . Anemia   . Asthma   . Constipation   . Coronary atherosclerosis of native coronary artery   . Dementia in conditions classified elsewhere without behavioral disturbance   . Depression   . DJD (degenerative joint disease)   . Edema   . GERD (gastroesophageal reflux disease)     . History of fall   . Hyperlipidemia   . Hypertension   . Insomnia   . Osteoporosis   . Pain in joint, pelvic region and thigh    left  . Right bundle branch block   . Unspecified constipation   . Unspecified urinary incontinence   . Urinary tract infection, site not specified    Past Surgical History:  Procedure Laterality Date  . ABDOMINAL HYSTERECTOMY    . CHOLECYSTECTOMY    . FEMUR IM NAIL  06/19/2011   Procedure: INTRAMEDULLARY (IM) NAIL FEMORAL;  Surgeon: Harvie JuniorJohn L Graves, MD;  Location: [facility] OR;  Service: Orthopedics;  Laterality: Left;  . SKIN CANCER EXCISION  02/15/2013   right forehead Dr. Irene LimboGoodrich    Allergies  Allergen Reactions  . Alendronate Sodium Other (See Comments)    Reaction:  Unknown   . Asmanex 120 Metered Doses [Mometasone Furoate] Other (See Comments)    Reaction:  Unknown   . Budesonide-Formoterol Fumarate Other (See Comments)    Reaction:  Unknown   . Fluticasone Propionate (Inhal) Other (See Comments)    Reaction:  Unknown   . Mometasone Furoate Other (See Comments)    Reaction:  Unknown   . Omeprazole Other (See Comments)    Reaction:  Unknown   . Valium [Diazepam] Other (See Comments)    Reaction:  Unknown       Medication List       Accurate as of  04/02/16  2:46 PM. Always use your most recent med list.          acetaminophen 325 MG tablet Commonly known as:  TYLENOL Take 650 mg by mouth every 6 (six) hours as needed for mild pain or moderate pain.   aspirin 81 MG chewable tablet Chew 81 mg by mouth daily.   atorvastatin 10 MG tablet Commonly known as:  LIPITOR Take 10 mg by mouth every evening.   calcium-vitamin D 500-200 MG-UNIT tablet Commonly known as:  OSCAL WITH D Take 1 tablet by mouth 2 (two) times daily.   CERTAVITE SENIOR/ANTIOXIDANT PO Take 1 tablet by mouth daily.   famotidine 20 MG tablet Commonly known as:  PEPCID Take 20 mg by mouth at bedtime.   feeding supplement Liqd Take 1 Container by mouth 3 (three)  times daily between meals.   isosorbide dinitrate 10 MG tablet Commonly known as:  ISORDIL Take 15 mg by mouth. Take 15 mg twice daily   memantine 10 MG tablet Commonly known as:  NAMENDA Take 10 mg by mouth 2 (two) times daily.   metoprolol tartrate 12.5 mg Tabs tablet Commonly known as:  LOPRESSOR Take 12.5 mg by mouth 2 (two) times daily.   polyethylene glycol packet Commonly known as:  MIRALAX / GLYCOLAX Take 17 g by mouth daily.   sertraline 100 MG tablet Commonly known as:  ZOLOFT Take 100 mg by mouth every morning.       Review of Systems  Constitutional: Negative for diaphoresis and fever.  HENT: Negative for congestion and hearing loss.   Eyes: Negative for pain and discharge.  Respiratory: Negative for cough, shortness of breath and wheezing.   Cardiovascular: Negative for chest pain, palpitations and leg swelling.  Gastrointestinal: Negative for abdominal pain, constipation, diarrhea and vomiting.  Genitourinary: Negative for dysuria, frequency and urgency.  Musculoskeletal: Positive for arthralgias and back pain. Negative for myalgias and neck pain.       S/p Left thumb and left ankle fxs, s/p Ortho, WBAT. S/p right wrist fx, healing nicely.   Skin: Negative for rash.  Neurological: Negative for seizures and headaches.       Dementia. 09/10/12 MMSE; total 19/30   Psychiatric/Behavioral: Positive for confusion, decreased concentration and dysphoric mood. Negative for hallucinations and suicidal ideas. The patient is nervous/anxious.     Immunization History  Administered Date(s) Administered  . Influenza Whole 02/24/2012, 02/23/2013  . Influenza-Unspecified 03/04/2014, 01/08/2015, 02/04/2016  . PPD Test 06/12/2014  . Pneumococcal Polysaccharide-23 04/20/2008  . Td 04/21/2003   Pertinent  Health Maintenance Due  Topic Date Due  . DEXA SCAN  06/26/1991  . PNA vac Low Risk Adult (2 of 2 - PCV13) 04/20/2009  . INFLUENZA VACCINE  Completed   Fall Risk   03/11/2015  Falls in the past year? No  Risk for fall due to : Impaired balance/gait   Vitals:   04/02/16 1435  BP: 116/60  Pulse: (!) 56  Temp: 97.7 F (36.5 C)  Weight: 106 lb (48.1 kg)  Height: 5\' 2"  (1.575 m)   Body mass index is 19.39 kg/m. Physical Exam  Constitutional: She appears well-developed and well-nourished.  HENT:  Head: Normocephalic and atraumatic.  Eyes: EOM are normal. Pupils are equal, round, and reactive to light. Right eye exhibits no discharge. Left eye exhibits no discharge.  Neck: Normal range of motion. Neck supple. No JVD present. No thyromegaly present.  Cardiovascular: Normal rate and regular rhythm.   No murmur heard. Pulmonary/Chest: No  respiratory distress. She has no wheezes. She has no rales.  Abdominal: Soft. Bowel sounds are normal. She exhibits no distension. There is no tenderness.  Musculoskeletal: Normal range of motion. She exhibits tenderness. She exhibits no edema.  S/p Left thumb and left ankle fxs, s/p Ortho, WBAT. S/p right wrist fx, healing nicely.    Lymphadenopathy:    She has no cervical adenopathy.  Neurological: She is alert. She has normal reflexes. No cranial nerve deficit. She exhibits normal muscle tone. Coordination normal.  Skin: Skin is warm and dry. No rash noted. No erythema.  Healed site of excisional biopsy of skin cancer of right forehead. 1.25 inch scar.  Psychiatric:  persisted confusion, pacing, restlessness, not sleep much at night     Labs reviewed:  Recent Labs  10/08/15 12/03/15 03/31/16  NA 142 145 145  K 4.2 4.4 4.5  BUN 30* 30* 30*  CREATININE 1.1 1.0 1.2*    Recent Labs  10/08/15 12/03/15 03/31/16  AST 28 20 18   ALT 23 11 9   ALKPHOS 106 69 69    Recent Labs  05/28/15 10/08/15 12/03/15  WBC 7.0 9.2 7.8  HGB 12.2 11.4* 11.1*  HCT 37 34* 33*  PLT 177 229 199   Lab Results  Component Value Date   TSH 2.87 03/31/2016   No results found for: HGBA1C Lab Results  Component Value Date     CHOL 131 01/09/2016   HDL 54 01/09/2016   LDLCALC 50 01/09/2016   TRIG 135 01/09/2016    Assessment/Plan 1. Vascular dementia without behavioral disturbance Continue memantine  2. Adult failure to thrive Reduced medications. I think dementia and coloric insufficiency is the driving influence.  3. Essential hypertension, benign Stopped metoprolol  4. Loss of weight Reduced medications  5. Hyperlipidemia, unspecified hyperlipidemia type Stopped atorvastatin

## 2016-04-09 ENCOUNTER — Encounter: Payer: Self-pay | Admitting: Nurse Practitioner

## 2016-04-09 ENCOUNTER — Non-Acute Institutional Stay (SKILLED_NURSING_FACILITY): Payer: Medicare Other | Admitting: Nurse Practitioner

## 2016-04-09 DIAGNOSIS — R627 Adult failure to thrive: Secondary | ICD-10-CM

## 2016-04-09 DIAGNOSIS — M1612 Unilateral primary osteoarthritis, left hip: Secondary | ICD-10-CM | POA: Diagnosis not present

## 2016-04-09 DIAGNOSIS — K5900 Constipation, unspecified: Secondary | ICD-10-CM | POA: Diagnosis not present

## 2016-04-09 DIAGNOSIS — F0150 Vascular dementia without behavioral disturbance: Secondary | ICD-10-CM | POA: Diagnosis not present

## 2016-04-09 DIAGNOSIS — I10 Essential (primary) hypertension: Secondary | ICD-10-CM | POA: Diagnosis not present

## 2016-04-09 DIAGNOSIS — K219 Gastro-esophageal reflux disease without esophagitis: Secondary | ICD-10-CM | POA: Diagnosis not present

## 2016-04-09 DIAGNOSIS — I25700 Atherosclerosis of coronary artery bypass graft(s), unspecified, with unstable angina pectoris: Secondary | ICD-10-CM | POA: Diagnosis not present

## 2016-04-09 DIAGNOSIS — I200 Unstable angina: Secondary | ICD-10-CM | POA: Diagnosis not present

## 2016-04-09 DIAGNOSIS — F0280 Dementia in other diseases classified elsewhere without behavioral disturbance: Secondary | ICD-10-CM | POA: Diagnosis not present

## 2016-04-09 DIAGNOSIS — F329 Major depressive disorder, single episode, unspecified: Secondary | ICD-10-CM | POA: Diagnosis not present

## 2016-04-09 DIAGNOSIS — F0393 Unspecified dementia, unspecified severity, with mood disturbance: Secondary | ICD-10-CM

## 2016-04-09 NOTE — Assessment & Plan Note (Addendum)
Controlled, off  Metoprolol 25mg  daily

## 2016-04-09 NOTE — Progress Notes (Signed)
Location:  [facility] Room Number: 100 Place of Service:  SNF (31) Provider:  Rayanna Matusik, Manxie  NP  Murray HodgkinsArthur Green, MD  Patient Care Team: Kimber RelicArthur G Green, MD as PCP - General (Internal Medicine) [facility] Shravan Salahuddin Johnney OuX Zaccheus Edmister, NP as Nurse Practitioner (Nurse Practitioner)  Extended Emergency Contact Information Primary Emergency Contact: [facility] Address: [address]          [place],  [id number] Home Phone: [phone] Relation: None  Code Status:  DNR Goals of care: Advanced Directive information Advanced Directives 04/09/2016  Does Patient Have a Medical Advance Directive? Yes  Type of Advance Directive Living will;Out of facility DNR (pink MOST or yellow form)  Does patient want to make changes to medical advance directive? No - Patient declined  Copy of Healthcare Power of Attorney in Chart? Yes  Pre-existing out of facility DNR order (yellow form or pink MOST form) Yellow form placed in chart (order not valid for inpatient use)     Chief Complaint  Patient presents with  . Acute Visit    c/o pain in left thigh    HPI:  Pt is a 82 y.o. female seen today for an acute visit for left hip pain with ROM of the joint, hx of left hip fx and s/p surgical repair, recent fall, X-ray ruled out fx, pain persisted. Tylenol prn is not adequate.    Hx of dementia, [facility], taking Namenda for memory, depression, better with Sertraline, HTN, controlled on Metoprolol and Imdur, constipation, stable on MirLax daily, GERD, stable while on Famotidine 20mg , FTT supportive measurs. Hx of right displaced radial and ulnar fxs, left ankle x, left thumb fx sustained from falling.    Past Medical History:  Diagnosis Date  . Adjustment disorder with depressed mood   . Anemia   . Asthma   . Constipation   . Coronary atherosclerosis of native coronary artery   . Dementia in conditions classified elsewhere without behavioral disturbance   . Depression    . DJD (degenerative joint disease)   . Edema   . GERD (gastroesophageal reflux disease)   . History of fall   . Hyperlipidemia   . Hypertension   . Insomnia   . Osteoporosis   . Pain in joint, pelvic region and thigh    left  . Right bundle branch block   . Unspecified constipation   . Unspecified urinary incontinence   . Urinary tract infection, site not specified    Past Surgical History:  Procedure Laterality Date  . ABDOMINAL HYSTERECTOMY    . CHOLECYSTECTOMY    . FEMUR IM NAIL  06/19/2011   Procedure: INTRAMEDULLARY (IM) NAIL FEMORAL;  Surgeon: Harvie JuniorJohn L Graves, MD;  Location: [facility] OR;  Service: Orthopedics;  Laterality: Left;  . SKIN CANCER EXCISION  02/15/2013   right forehead Dr. Irene LimboGoodrich    Allergies  Allergen Reactions  . Alendronate Sodium Other (See Comments)    Reaction:  Unknown   . Asmanex 120 Metered Doses [Mometasone Furoate] Other (See Comments)    Reaction:  Unknown   . Budesonide-Formoterol Fumarate Other (See Comments)    Reaction:  Unknown   . Fluticasone Propionate (Inhal) Other (See Comments)    Reaction:  Unknown   . Mometasone Furoate Other (See Comments)    Reaction:  Unknown   . Omeprazole Other (See Comments)    Reaction:  Unknown   . Valium [Diazepam] Other (See Comments)    Reaction:  Unknown  Allergies as of 04/09/2016      Reactions   Alendronate Sodium Other (See Comments)   Reaction:  Unknown    Asmanex 120 Metered Doses [mometasone Furoate] Other (See Comments)   Reaction:  Unknown    Budesonide-formoterol Fumarate Other (See Comments)   Reaction:  Unknown    Fluticasone Propionate (inhal) Other (See Comments)   Reaction:  Unknown    Mometasone Furoate Other (See Comments)   Reaction:  Unknown    Omeprazole Other (See Comments)   Reaction:  Unknown    Valium [diazepam] Other (See Comments)   Reaction:  Unknown       Medication List       Accurate as of 04/09/16  3:04 PM. Always use your most recent med list.           acetaminophen 325 MG tablet Commonly known as:  TYLENOL Take 650 mg by mouth every 6 (six) hours as needed for mild pain or moderate pain.   aspirin 81 MG chewable tablet Chew 81 mg by mouth daily.   calcium-vitamin D 500-200 MG-UNIT tablet Commonly known as:  OSCAL WITH D Take 1 tablet by mouth 2 (two) times daily.   CERTAVITE SENIOR/ANTIOXIDANT PO Take 1 tablet by mouth daily.   famotidine 20 MG tablet Commonly known as:  PEPCID Take 20 mg by mouth at bedtime.   feeding supplement Liqd Take 1 Container by mouth 3 (three) times daily between meals.   isosorbide dinitrate 10 MG tablet Commonly known as:  ISORDIL Take 15 mg by mouth. Take 15 mg twice daily   memantine 10 MG tablet Commonly known as:  NAMENDA Take 10 mg by mouth 2 (two) times daily.   polyethylene glycol packet Commonly known as:  MIRALAX / GLYCOLAX Take 17 g by mouth daily.       Review of Systems  Constitutional: Negative for diaphoresis and fever.  HENT: Negative for congestion and hearing loss.   Eyes: Negative for pain and discharge.  Respiratory: Negative for cough, shortness of breath and wheezing.   Cardiovascular: Negative for chest pain, palpitations and leg swelling.  Gastrointestinal: Negative for abdominal pain, constipation, diarrhea and vomiting.  Genitourinary: Negative for dysuria, frequency and urgency.  Musculoskeletal: Positive for arthralgias and back pain. Negative for myalgias and neck pain.       S/p Left thumb and left ankle fxs, s/p Ortho, WBAT. S/p right wrist fx, healing nicely. Left hip pain with ROM, but able to ambulates  Skin: Negative for rash.  Neurological: Negative for seizures and headaches.       Dementia. 09/10/12 MMSE; total 19/30   Psychiatric/Behavioral: Positive for confusion, decreased concentration and dysphoric mood. Negative for hallucinations and suicidal ideas. The patient is nervous/anxious.     Immunization History  Administered Date(s)  Administered  . Influenza Whole 02/24/2012, 02/23/2013  . Influenza-Unspecified 03/04/2014, 01/08/2015, 02/04/2016  . PPD Test 06/12/2014  . Pneumococcal Polysaccharide-23 04/20/2008  . Td 04/21/2003   Pertinent  Health Maintenance Due  Topic Date Due  . DEXA SCAN  06/26/1991  . PNA vac Low Risk Adult (2 of 2 - PCV13) 04/20/2009  . INFLUENZA VACCINE  Completed   Fall Risk  03/11/2015  Falls in the past year? No  Risk for fall due to : Impaired balance/gait   Functional Status Survey:    Vitals:   04/09/16 1405  BP: 110/78  Pulse: 72  Resp: 18  Temp: 97.7 F (36.5 C)  Weight: 102 lb (46.3 kg)  Height:  5\' 2"  (1.575 m)   Body mass index is 18.66 kg/m. Physical Exam  Constitutional: She appears well-developed and well-nourished.  HENT:  Head: Normocephalic and atraumatic.  Eyes: EOM are normal. Pupils are equal, round, and reactive to light. Right eye exhibits no discharge. Left eye exhibits no discharge.  Neck: Normal range of motion. Neck supple. No JVD present. No thyromegaly present.  Cardiovascular: Normal rate and regular rhythm.   No murmur heard. Pulmonary/Chest: No respiratory distress. She has no wheezes. She has no rales.  Abdominal: Soft. Bowel sounds are normal. She exhibits no distension. There is no tenderness.  Musculoskeletal: Normal range of motion. She exhibits tenderness. She exhibits no edema.  S/p Left thumb and left ankle fxs, s/p Ortho, WBAT. S/p right wrist fx, healing nicely. Left hip pain with ROM, able to ambulates w/o excessive pain.    Lymphadenopathy:    She has no cervical adenopathy.  Neurological: She is alert. She has normal reflexes. No cranial nerve deficit. She exhibits normal muscle tone. Coordination normal.  Skin: Skin is warm and dry. No rash noted. No erythema.  Healed site of excisional biopsy of skin cancer of right forehead. 1.25 inch scar.  Psychiatric:  persisted confusion, pacing, restlessness, not sleep much at night      Labs reviewed:  Recent Labs  10/08/15 12/03/15 03/31/16  NA 142 145 145  K 4.2 4.4 4.5  BUN 30* 30* 30*  CREATININE 1.1 1.0 1.2*    Recent Labs  10/08/15 12/03/15 03/31/16  AST 28 20 18   ALT 23 11 9   ALKPHOS 106 69 69    Recent Labs  05/28/15 10/08/15 12/03/15  WBC 7.0 9.2 7.8  HGB 12.2 11.4* 11.1*  HCT 37 34* 33*  PLT 177 229 199   Lab Results  Component Value Date   TSH 2.87 03/31/2016   No results found for: HGBA1C Lab Results  Component Value Date   CHOL 131 01/09/2016   HDL 54 01/09/2016   LDLCALC 50 01/09/2016   TRIG 135 01/09/2016    Significant Diagnostic Results in last 30 days:  No results found.  Assessment/Plan Osteoarthritis of left hip fall, pain in the left hip, X-ray 03/24/16 showed no acute fx or dislocation, old fx of mid left femur with intramedullary rod and screw fixation. Repeat X-ray left hip, femur, pelvix, Norco 325mg  1/2 bid .    Essential hypertension, benign Controlled, off  Metoprolol 25mg  daily  CAD (coronary artery disease) of artery bypass graft No c/o angina since last visited. Continue Isosorbide 30mg  daily, Atorvastatin 10mg , ASA 81mg    GERD (gastroesophageal reflux disease) Stable. Continue Famotidine 20mg  daily.    Constipation Stable. Continue MiraLax daily.    Dementia Progressing, continue Namenda for now, Lorazepam prn for agitation, Lack of safety awareness and increased frailty/gait problem contributory to her falling   Depression due to dementia Improved mood, anxiety, agitation, and attempts to leave facility, off Sertraline   Adult failure to thrive Persists. 03/31/16 wbc 8.6, Hgb 11.8, plt 201, Na 145, K 4.5, bun 30, creat 1.15, TSH 2.87      Family/ staff Communication: SN[facility]  Labs/tests ordered:  X-ray left hip, femur, pelvis.

## 2016-04-09 NOTE — Assessment & Plan Note (Signed)
Stable. Continue MiraLax daily.  

## 2016-04-09 NOTE — Assessment & Plan Note (Signed)
fall, pain in the left hip, X-ray 03/24/16 showed no acute fx or dislocation, old fx of mid left femur with intramedullary rod and screw fixation. Repeat X-ray left hip, femur, pelvix, Norco 325mg  1/2 bid .

## 2016-04-09 NOTE — Assessment & Plan Note (Signed)
Improved mood, anxiety, agitation, and attempts to leave facility, off Sertraline

## 2016-04-09 NOTE — Assessment & Plan Note (Signed)
Progressing, continue Namenda for now, Lorazepam prn for agitation, Lack of safety awareness and increased frailty/gait problem contributory to her falling  

## 2016-04-09 NOTE — Assessment & Plan Note (Signed)
No c/o angina since last visited. Continue Isosorbide 30mg daily, Atorvastatin 10mg, ASA 81mg 

## 2016-04-09 NOTE — Assessment & Plan Note (Addendum)
Persists. 03/31/16 wbc 8.6, Hgb 11.8, plt 201, Na 145, K 4.5, bun 30, creat 1.15, TSH 2.87

## 2016-04-09 NOTE — Assessment & Plan Note (Signed)
Stable. Continue Famotidine 20mg daily.  

## 2016-04-29 ENCOUNTER — Other Ambulatory Visit: Payer: Self-pay | Admitting: *Deleted

## 2016-04-29 MED ORDER — HYDROCODONE-ACETAMINOPHEN 5-325 MG PO TABS: ORAL_TABLET | ORAL | 0 refills | Status: DC

## 2016-04-29 NOTE — Telephone Encounter (Signed)
[facility] 

## 2016-05-04 ENCOUNTER — Encounter: Payer: Self-pay | Admitting: Nurse Practitioner

## 2016-05-04 ENCOUNTER — Non-Acute Institutional Stay (SKILLED_NURSING_FACILITY): Payer: Medicare Other | Admitting: Nurse Practitioner

## 2016-05-04 DIAGNOSIS — K5900 Constipation, unspecified: Secondary | ICD-10-CM

## 2016-05-04 DIAGNOSIS — R627 Adult failure to thrive: Secondary | ICD-10-CM | POA: Diagnosis not present

## 2016-05-04 DIAGNOSIS — F0280 Dementia in other diseases classified elsewhere without behavioral disturbance: Secondary | ICD-10-CM | POA: Diagnosis not present

## 2016-05-04 DIAGNOSIS — I10 Essential (primary) hypertension: Secondary | ICD-10-CM

## 2016-05-04 DIAGNOSIS — J069 Acute upper respiratory infection, unspecified: Secondary | ICD-10-CM | POA: Diagnosis not present

## 2016-05-04 DIAGNOSIS — I200 Unstable angina: Secondary | ICD-10-CM

## 2016-05-04 DIAGNOSIS — F0150 Vascular dementia without behavioral disturbance: Secondary | ICD-10-CM

## 2016-05-04 DIAGNOSIS — K219 Gastro-esophageal reflux disease without esophagitis: Secondary | ICD-10-CM

## 2016-05-04 DIAGNOSIS — M1612 Unilateral primary osteoarthritis, left hip: Secondary | ICD-10-CM | POA: Diagnosis not present

## 2016-05-04 DIAGNOSIS — I25700 Atherosclerosis of coronary artery bypass graft(s), unspecified, with unstable angina pectoris: Secondary | ICD-10-CM

## 2016-05-04 DIAGNOSIS — F329 Major depressive disorder, single episode, unspecified: Secondary | ICD-10-CM

## 2016-05-04 DIAGNOSIS — F0393 Unspecified dementia, unspecified severity, with mood disturbance: Secondary | ICD-10-CM

## 2016-05-04 NOTE — Assessment & Plan Note (Signed)
Persists. 03/31/16 wbc 8.6, Hgb 11.8, plt 201, Na 145, K 4.5, bun 30, creat 1.15, TSH 2.87   

## 2016-05-04 NOTE — Assessment & Plan Note (Signed)
fall, pain in the left hip, X-ray 03/24/16 showed no acute fx or dislocation, old fx of mid left femur with intramedullary rod and screw fixation. Repeat X-ray left hip, femur, pelvix, continue Norco 325mg  1/2 tid

## 2016-05-04 NOTE — Progress Notes (Signed)
Location:  [facility] Room Number: 100 Place of Service:  SNF (31) Provider:  Mast, Manxie  NP  Murray HodgkinsArthur Green, MD  Patient Care Team: Kimber RelicArthur G Green, MD as PCP - General (Internal Medicine) [facility] Man Johnney OuX Mast, NP as Nurse Practitioner (Nurse Practitioner)  Extended Emergency Contact Information Primary Emergency Contact: [facility]A Address: [address]          [place],  [id number] Home Phone: [phone] Relation: None  Code Status:  DNR Goals of care: Advanced Directive information Advanced Directives 05/04/2016  Does Patient Have a Medical Advance Directive? Yes  Type of Advance Directive Living will;Out of facility DNR (pink MOST or yellow form)  Does patient want to make changes to medical advance directive? No - Patient declined  Copy of Healthcare Power of Attorney in Chart? -  Pre-existing out of facility DNR order (yellow form or pink MOST form) Yellow form placed in chart (order not valid for inpatient use)     Chief Complaint  Patient presents with  . Acute Visit    non-productive cough    HPI:  Pt is a 83 y.o. female seen today for an acute visit for none productive cough, increased breath sounds posterior mid to lower lungs, afebrile, no O2 desaturation, denied chest pain, palpitation, nausea, vomiting, or diarrhea. Taking Tamiflu, tolerated.   Hx of dementia, resides in [facility], taking Namenda for memory, depression, off Sertraline, HTN, controlled on Imdur, constipation, stable on MirLax daily, GERD, stable while on Famotidine 20mg , FTT supportive measurs. Hx of right displaced radial and ulnar fxs, left ankle x, left thumb fx sustained from falling. Left hip pain is managed with Norco 5/325 1/2 tid.   Past Medical History:  Diagnosis Date  . Adjustment disorder with depressed mood   . Anemia   . Asthma   . Constipation   . Coronary atherosclerosis of native coronary artery   . Dementia in conditions  classified elsewhere without behavioral disturbance   . Depression   . DJD (degenerative joint disease)   . Edema   . GERD (gastroesophageal reflux disease)   . History of fall   . Hyperlipidemia   . Hypertension   . Insomnia   . Osteoporosis   . Pain in joint, pelvic region and thigh    left  . Right bundle branch block   . Unspecified constipation   . Unspecified urinary incontinence   . Urinary tract infection, site not specified    Past Surgical History:  Procedure Laterality Date  . ABDOMINAL HYSTERECTOMY    . CHOLECYSTECTOMY    . FEMUR IM NAIL  06/19/2011   Procedure: INTRAMEDULLARY (IM) NAIL FEMORAL;  Surgeon: Harvie JuniorJohn L Graves, MD;  Location: [facility] OR;  Service: Orthopedics;  Laterality: Left;  . SKIN CANCER EXCISION  02/15/2013   right forehead Dr. Irene LimboGoodrich    Allergies  Allergen Reactions  . Alendronate Sodium Other (See Comments)    Reaction:  Unknown   . Asmanex 120 Metered Doses [Mometasone Furoate] Other (See Comments)    Reaction:  Unknown   . Budesonide-Formoterol Fumarate Other (See Comments)    Reaction:  Unknown   . Fluticasone Propionate (Inhal) Other (See Comments)    Reaction:  Unknown   . Mometasone Furoate Other (See Comments)    Reaction:  Unknown   . Omeprazole Other (See Comments)    Reaction:  Unknown   . Valium [Diazepam] Other (See Comments)    Reaction:  Unknown  Allergies as of 05/04/2016      Reactions   Alendronate Sodium Other (See Comments)   Reaction:  Unknown    Asmanex 120 Metered Doses [mometasone Furoate] Other (See Comments)   Reaction:  Unknown    Budesonide-formoterol Fumarate Other (See Comments)   Reaction:  Unknown    Fluticasone Propionate (inhal) Other (See Comments)   Reaction:  Unknown    Mometasone Furoate Other (See Comments)   Reaction:  Unknown    Omeprazole Other (See Comments)   Reaction:  Unknown    Valium [diazepam] Other (See Comments)   Reaction:  Unknown       Medication List       Accurate as of  05/04/16  6:29 PM. Always use your most recent med list.          acetaminophen 325 MG tablet Commonly known as:  TYLENOL Take 650 mg by mouth every 6 (six) hours as needed for mild pain or moderate pain.   aspirin 81 MG chewable tablet Chew 81 mg by mouth daily.   azithromycin 250 MG tablet Commonly known as:  ZITHROMAX Take by mouth daily. 500 mg for the first day, then 250 for 4 days   calcium-vitamin D 500-200 MG-UNIT tablet Commonly known as:  OSCAL WITH D Take 1 tablet by mouth 2 (two) times daily.   CERTAVITE SENIOR/ANTIOXIDANT PO Take 1 tablet by mouth daily.   famotidine 20 MG tablet Commonly known as:  PEPCID Take 20 mg by mouth at bedtime.   feeding supplement Liqd Take 1 Container by mouth 3 (three) times daily between meals.   guaiFENesin 600 MG 12 hr tablet Commonly known as:  MUCINEX Take 600 mg by mouth 2 (two) times daily.   HYDROcodone-acetaminophen 5-325 MG tablet Commonly known as:  NORCO/VICODIN Take 1/2 tablet by mouth three times daily for pain   isosorbide dinitrate 10 MG tablet Commonly known as:  ISORDIL Take 15 mg by mouth. Take 15 mg twice daily   memantine 10 MG tablet Commonly known as:  NAMENDA Take 10 mg by mouth 2 (two) times daily.   oseltamivir 75 MG capsule Commonly known as:  TAMIFLU Take 75 mg by mouth daily.   polyethylene glycol packet Commonly known as:  MIRALAX / GLYCOLAX Take 17 g by mouth daily.   ROBAFEN CF COUGH/COLD 5-10-100 MG/5ML Syrp Generic drug:  Phenylephrine-DM-GG Take 10 mLs by mouth every 6 (six) hours as needed.       Review of Systems  Constitutional: Negative for diaphoresis and fever.  HENT: Negative for congestion and hearing loss.   Eyes: Negative for pain and discharge.  Respiratory: Negative for cough, shortness of breath and wheezing.   Cardiovascular: Negative for chest pain, palpitations and leg swelling.  Gastrointestinal: Negative for abdominal pain, constipation, diarrhea and  vomiting.  Genitourinary: Negative for dysuria, frequency and urgency.  Musculoskeletal: Positive for arthralgias and back pain. Negative for myalgias and neck pain.       S/p Left thumb and left ankle fxs, s/p Ortho, WBAT. S/p right wrist fx, healing nicely. Left hip pain with ROM, but able to ambulates  Skin: Negative for rash.  Neurological: Negative for seizures and headaches.       Dementia. 09/10/12 MMSE; total 19/30   Psychiatric/Behavioral: Positive for confusion, decreased concentration and dysphoric mood. Negative for hallucinations and suicidal ideas. The patient is nervous/anxious.     Immunization History  Administered Date(s) Administered  . Influenza Whole 02/24/2012, 02/23/2013  . Influenza-Unspecified 03/04/2014, 01/08/2015,  02/04/2016  . PPD Test 06/12/2014  . Pneumococcal Polysaccharide-23 04/20/2008  . Td 04/21/2003   Pertinent  Health Maintenance Due  Topic Date Due  . DEXA SCAN  06/26/1991  . PNA vac Low Risk Adult (2 of 2 - PCV13) 04/20/2009  . INFLUENZA VACCINE  Completed   Fall Risk  03/11/2015  Falls in the past year? No  Risk for fall due to : Impaired balance/gait   Functional Status Survey:    Vitals:   05/04/16 1541  BP: 130/60  Pulse: 70  Resp: 18  Temp: 98.5 F (36.9 C)  Weight: 102 lb (46.3 kg)  Height: 5\' 2"  (1.575 m)   Body mass index is 18.66 kg/m. Physical Exam  Constitutional: She appears well-developed and well-nourished.  HENT:  Head: Normocephalic and atraumatic.  Eyes: EOM are normal. Pupils are equal, round, and reactive to light. Right eye exhibits no discharge. Left eye exhibits no discharge.  Neck: Normal range of motion. Neck supple. No JVD present. No thyromegaly present.  Cardiovascular: Normal rate and regular rhythm.   No murmur heard. Pulmonary/Chest: No respiratory distress. She has no wheezes. She has no rales.  Abdominal: Soft. Bowel sounds are normal. She exhibits no distension. There is no tenderness.    Musculoskeletal: Normal range of motion. She exhibits tenderness. She exhibits no edema.  S/p Left thumb and left ankle fxs, s/p Ortho, WBAT. S/p right wrist fx, healing nicely. Left hip pain with ROM, able to ambulates w/o excessive pain.    Lymphadenopathy:    She has no cervical adenopathy.  Neurological: She is alert. She has normal reflexes. No cranial nerve deficit. She exhibits normal muscle tone. Coordination normal.  Skin: Skin is warm and dry. No rash noted. No erythema.  Healed site of excisional biopsy of skin cancer of right forehead. 1.25 inch scar.  Psychiatric:  persisted confusion, pacing, restlessness, not sleep much at night     Labs reviewed:  Recent Labs  10/08/15 12/03/15 03/31/16  NA 142 145 145  K 4.2 4.4 4.5  BUN 30* 30* 30*  CREATININE 1.1 1.0 1.2*    Recent Labs  10/08/15 12/03/15 03/31/16  AST 28 20 18   ALT 23 11 9   ALKPHOS 106 69 69    Recent Labs  05/28/15 10/08/15 12/03/15  WBC 7.0 9.2 7.8  HGB 12.2 11.4* 11.1*  HCT 37 34* 33*  PLT 177 229 199   Lab Results  Component Value Date   TSH 2.87 03/31/2016   No results found for: HGBA1C Lab Results  Component Value Date   CHOL 131 01/09/2016   HDL 54 01/09/2016   LDLCALC 50 01/09/2016   TRIG 135 01/09/2016    Significant Diagnostic Results in last 30 days:  No results found.  Assessment/Plan Acute upper respiratory infection none productive cough, increased breath sounds posterior mid to lower lungs, afebrile, no O2 desaturation, denied chest pain, palpitation, nausea, vomiting, or diarrhea. Taking Tamiflu, tolerated.  Adding Z-pack, Mucinex 600mg  bid x 3 days. Observe.   Essential hypertension, benign Controlled, off  Metoprolol 25mg  daily  CAD (coronary artery disease) of artery bypass graft No c/o angina since last visited. Continue Isosorbide 30mg  daily, Atorvastatin 10mg , ASA 81mg    GERD (gastroesophageal reflux disease) Stable. Continue Famotidine 20mg   daily.    Constipation Stable. Continue MiraLax daily.    Dementia Progressing, continue Namenda, off Lorazepam prn for agitation, Lack of safety awareness and increased frailty/gait problem contributory to her falling   Depression due to dementia Improved  mood, anxiety, agitation, and attempts to leave facility, off Sertraline   Osteoarthritis of left hip fall, pain in the left hip, X-ray 03/24/16 showed no acute fx or dislocation, old fx of mid left femur with intramedullary rod and screw fixation. Repeat X-ray left hip, femur, pelvix, continue Norco 325mg  1/2 tid    Adult failure to thrive Persists. 03/31/16 wbc 8.6, Hgb 11.8, plt 201, Na 145, K 4.5, bun 30, creat 1.15, TSH 2.87      Family/ staff Communication: SNF  Labs/tests ordered:  none

## 2016-05-04 NOTE — Assessment & Plan Note (Signed)
Stable. Continue Famotidine 20mg daily.  

## 2016-05-04 NOTE — Assessment & Plan Note (Signed)
No c/o angina since last visited. Continue Isosorbide 30mg daily, Atorvastatin 10mg, ASA 81mg 

## 2016-05-04 NOTE — Assessment & Plan Note (Addendum)
Progressing, continue Namenda, off Lorazepam prn for agitation, Lack of safety awareness and increased frailty/gait problem contributory to her falling

## 2016-05-04 NOTE — Assessment & Plan Note (Signed)
Improved mood, anxiety, agitation, and attempts to leave facility, off Sertraline  

## 2016-05-04 NOTE — Assessment & Plan Note (Signed)
Controlled, off  Metoprolol 25mg daily 

## 2016-05-04 NOTE — Assessment & Plan Note (Signed)
Stable. Continue MiraLax daily.  

## 2016-05-04 NOTE — Assessment & Plan Note (Signed)
none productive cough, increased breath sounds posterior mid to lower lungs, afebrile, no O2 desaturation, denied chest pain, palpitation, nausea, vomiting, or diarrhea. Taking Tamiflu, tolerated.  Adding Z-pack, Mucinex 600mg  bid x 3 days. Observe.

## 2016-06-04 ENCOUNTER — Non-Acute Institutional Stay (SKILLED_NURSING_FACILITY): Payer: Medicare Other | Admitting: Nurse Practitioner

## 2016-06-04 ENCOUNTER — Encounter: Payer: Self-pay | Admitting: Nurse Practitioner

## 2016-06-04 DIAGNOSIS — R627 Adult failure to thrive: Secondary | ICD-10-CM | POA: Diagnosis not present

## 2016-06-04 DIAGNOSIS — I10 Essential (primary) hypertension: Secondary | ICD-10-CM

## 2016-06-04 DIAGNOSIS — K5900 Constipation, unspecified: Secondary | ICD-10-CM

## 2016-06-04 DIAGNOSIS — F0150 Vascular dementia without behavioral disturbance: Secondary | ICD-10-CM | POA: Diagnosis not present

## 2016-06-04 DIAGNOSIS — F329 Major depressive disorder, single episode, unspecified: Secondary | ICD-10-CM

## 2016-06-04 DIAGNOSIS — K219 Gastro-esophageal reflux disease without esophagitis: Secondary | ICD-10-CM | POA: Diagnosis not present

## 2016-06-04 DIAGNOSIS — I200 Unstable angina: Secondary | ICD-10-CM | POA: Diagnosis not present

## 2016-06-04 DIAGNOSIS — I25700 Atherosclerosis of coronary artery bypass graft(s), unspecified, with unstable angina pectoris: Secondary | ICD-10-CM | POA: Diagnosis not present

## 2016-06-04 DIAGNOSIS — F0280 Dementia in other diseases classified elsewhere without behavioral disturbance: Secondary | ICD-10-CM

## 2016-06-04 DIAGNOSIS — F0393 Unspecified dementia, unspecified severity, with mood disturbance: Secondary | ICD-10-CM

## 2016-06-04 NOTE — Assessment & Plan Note (Signed)
Improved mood, anxiety, agitation, and attempts to leave facility, off Sertraline  

## 2016-06-04 NOTE — Assessment & Plan Note (Signed)
Stable. Continue MiraLax daily.  

## 2016-06-04 NOTE — Assessment & Plan Note (Signed)
Progressing, continue Namenda, off Lorazepam prn for agitation

## 2016-06-04 NOTE — Assessment & Plan Note (Signed)
No c/o angina since last visited. Continue Isosorbide 30mg  daily, ASA 81mg 

## 2016-06-04 NOTE — Assessment & Plan Note (Signed)
Stable. Continue Famotidine 20mg daily.  

## 2016-06-04 NOTE — Assessment & Plan Note (Signed)
Persisted, continue supportive measures.

## 2016-06-04 NOTE — Assessment & Plan Note (Signed)
Controlled, off  Metoprolol 25mg  daily, continue Imdur

## 2016-06-04 NOTE — Progress Notes (Signed)
Location:  [facility] Room Number: [phone] Place of Service:  SNF (31) Provider:  Mast, Manxie  NP  Murray Hodgkins, MD  Patient Care Team: Kimber Relic, MD as PCP - General (Internal Medicine) [facility] Johnney Ou, NP as Nurse Practitioner (Nurse Practitioner)  Extended Emergency Contact Information Primary Emergency Contact: [facility] Address: [address] [place]          [name],  [address] Home Phone: [phone] Relation: None  Code Status:  DNR Goals of care: Advanced Directive information Advanced Directives 06/04/2016  Does Patient Have a Medical Advance Directive? Yes  Type of Advance Directive Living will;Out of facility DNR (pink MOST or yellow form)  Does patient want to make changes to medical advance directive? No - Patient declined  Copy of Healthcare Power of Attorney in Chart? -  Pre-existing out of facility DNR order (yellow form or pink MOST form) Yellow form placed in chart (order not valid for inpatient use)     Chief Complaint  Patient presents with  . Medical Management of Chronic Issues    HPI:  Pt is a 83 y.o. female seen today for medical management of chronic diseases.     Hx of dementia, resides in [facility], taking Namenda for memory, depression, off Sertraline, HTN, controlled on Imdur, constipation, stable on MirLax daily, GERD, stable while on Famotidine 20mg , FTT supportive measurs. Hx of right displaced radial and ulnar fxs, left ankle x, left thumb fx sustained from falling. Left hip pain is managed with Norco 5/325 1/2 tid.  Past Medical History:  Diagnosis Date  . Adjustment disorder with depressed mood   . Anemia   . Asthma   . Constipation   . Coronary atherosclerosis of native coronary artery   . Dementia in conditions classified elsewhere without behavioral disturbance   . Depression   . DJD (degenerative joint disease)   . Edema   . GERD (gastroesophageal reflux disease)   . History of fall     . Hyperlipidemia   . Hypertension   . Insomnia   . Osteoporosis   . Pain in joint, pelvic region and thigh    left  . Right bundle branch block   . Unspecified constipation   . Unspecified urinary incontinence   . Urinary tract infection, site not specified    Past Surgical History:  Procedure Laterality Date  . ABDOMINAL HYSTERECTOMY    . CHOLECYSTECTOMY    . FEMUR IM NAIL  06/19/2011   Procedure: INTRAMEDULLARY (IM) NAIL FEMORAL;  Surgeon: Harvie Junior, MD;  Location: [facility] OR;  Service: Orthopedics;  Laterality: Left;  . SKIN CANCER EXCISION  02/15/2013   right forehead Dr. Irene Limbo    Allergies  Allergen Reactions  . Alendronate Sodium Other (See Comments)    Reaction:  Unknown   . Asmanex 120 Metered Doses [Mometasone Furoate] Other (See Comments)    Reaction:  Unknown   . Budesonide-Formoterol Fumarate Other (See Comments)    Reaction:  Unknown   . Fluticasone Propionate (Inhal) Other (See Comments)    Reaction:  Unknown   . Mometasone Furoate Other (See Comments)    Reaction:  Unknown   . Omeprazole Other (See Comments)    Reaction:  Unknown   . Valium [Diazepam] Other (See Comments)    Reaction:  Unknown     Allergies as of 06/04/2016      Reactions   Alendronate Sodium Other (See Comments)   Reaction:  Unknown  Asmanex 120 Metered Doses [mometasone Furoate] Other (See Comments)   Reaction:  Unknown    Budesonide-formoterol Fumarate Other (See Comments)   Reaction:  Unknown    Fluticasone Propionate (inhal) Other (See Comments)   Reaction:  Unknown    Mometasone Furoate Other (See Comments)   Reaction:  Unknown    Omeprazole Other (See Comments)   Reaction:  Unknown    Valium [diazepam] Other (See Comments)   Reaction:  Unknown       Medication List       Accurate as of 06/04/16  3:21 PM. Always use your most recent med list.          acetaminophen 325 MG tablet Commonly known as:  TYLENOL Take 650 mg by mouth every 6 (six) hours as needed for  mild pain or moderate pain.   aspirin 81 MG chewable tablet Chew 81 mg by mouth daily.   calcium-vitamin D 500-200 MG-UNIT tablet Commonly known as:  OSCAL WITH D Take 1 tablet by mouth 2 (two) times daily.   CERTAVITE SENIOR/ANTIOXIDANT PO Take 1 tablet by mouth daily.   famotidine 20 MG tablet Commonly known as:  PEPCID Take 20 mg by mouth at bedtime.   feeding supplement Liqd Take 1 Container by mouth 3 (three) times daily between meals.   HYDROcodone-acetaminophen 5-325 MG tablet Commonly known as:  NORCO/VICODIN Take 1/2 tablet by mouth three times daily for pain   isosorbide dinitrate 10 MG tablet Commonly known as:  ISORDIL Take 15 mg by mouth. Take 15 mg twice daily   memantine 10 MG tablet Commonly known as:  NAMENDA Take 10 mg by mouth 2 (two) times daily.   polyethylene glycol packet Commonly known as:  MIRALAX / GLYCOLAX Take 17 g by mouth daily.       Review of Systems  Constitutional: Negative for diaphoresis and fever.  HENT: Negative for congestion and hearing loss.   Eyes: Negative for pain and discharge.  Respiratory: Negative for cough, shortness of breath and wheezing.   Cardiovascular: Negative for chest pain, palpitations and leg swelling.  Gastrointestinal: Negative for abdominal pain, constipation, diarrhea and vomiting.  Genitourinary: Negative for dysuria, frequency and urgency.  Musculoskeletal: Positive for arthralgias and back pain. Negative for myalgias and neck pain.       S/p Left thumb and left ankle fxs, s/p Ortho, WBAT. S/p right wrist fx, healing nicely. Left hip pain with ROM, but able to ambulates  Skin: Negative for rash.  Neurological: Negative for seizures and headaches.       Dementia. 09/10/12 MMSE; total 19/30   Psychiatric/Behavioral: Positive for confusion, decreased concentration and dysphoric mood. Negative for hallucinations and suicidal ideas. The patient is nervous/anxious.     Immunization History  Administered  Date(s) Administered  . Influenza Whole 02/24/2012, 02/23/2013  . Influenza-Unspecified 03/04/2014, 01/08/2015, 02/04/2016  . PPD Test 06/12/2014  . Pneumococcal Polysaccharide-23 04/20/2008  . Td 04/21/2003   Pertinent  Health Maintenance Due  Topic Date Due  . DEXA SCAN  06/26/1991  . PNA vac Low Risk Adult (2 of 2 - PCV13) 04/20/2009  . INFLUENZA VACCINE  Completed   Fall Risk  03/11/2015  Falls in the past year? No  Risk for fall due to : Impaired balance/gait   Functional Status Survey:    Vitals:   06/04/16 1453  BP: 122/64  Pulse: 70  Resp: 20  Temp: 97.1 F (36.2 C)  Weight: 102 lb 6.4 oz (46.4 kg)  Height: 5\' 2"  (  1.575 m)   Body mass index is 18.73 kg/m. Physical Exam  Constitutional: She appears well-developed and well-nourished.  HENT:  Head: Normocephalic and atraumatic.  Eyes: EOM are normal. Pupils are equal, round, and reactive to light. Right eye exhibits no discharge. Left eye exhibits no discharge.  Neck: Normal range of motion. Neck supple. No JVD present. No thyromegaly present.  Cardiovascular: Normal rate and regular rhythm.   No murmur heard. Pulmonary/Chest: No respiratory distress. She has no wheezes. She has no rales.  Abdominal: Soft. Bowel sounds are normal. She exhibits no distension. There is no tenderness.  Musculoskeletal: Normal range of motion. She exhibits tenderness. She exhibits no edema.  S/p Left thumb and left ankle fxs, s/p Ortho, WBAT. S/p right wrist fx, healing nicely. Left hip pain with ROM, able to ambulates w/o excessive pain.    Lymphadenopathy:    She has no cervical adenopathy.  Neurological: She is alert. She has normal reflexes. No cranial nerve deficit. She exhibits normal muscle tone. Coordination normal.  Skin: Skin is warm and dry. No rash noted. No erythema.  Healed site of excisional biopsy of skin cancer of right forehead. 1.25 inch scar.  Psychiatric:  persisted confusion, pacing, restlessness, not sleep  much at night     Labs reviewed:  Recent Labs  10/08/15 12/03/15 03/31/16  NA 142 145 145  K 4.2 4.4 4.5  BUN 30* 30* 30*  CREATININE 1.1 1.0 1.2*    Recent Labs  10/08/15 12/03/15 03/31/16  AST 28 20 18   ALT 23 11 9   ALKPHOS 106 69 69    Recent Labs  10/08/15 12/03/15  WBC 9.2 7.8  HGB 11.4* 11.1*  HCT 34* 33*  PLT 229 199   Lab Results  Component Value Date   TSH 2.87 03/31/2016   No results found for: HGBA1C Lab Results  Component Value Date   CHOL 131 01/09/2016   HDL 54 01/09/2016   LDLCALC 50 01/09/2016   TRIG 135 01/09/2016    Significant Diagnostic Results in last 30 days:  No results found.  Assessment/Plan Essential hypertension, benign Controlled, off  Metoprolol 25mg  daily, continue Imdur  CAD (coronary artery disease) of artery bypass graft No c/o angina since last visited. Continue Isosorbide 30mg  daily, ASA 81mg    GERD (gastroesophageal reflux disease) Stable. Continue Famotidine 20mg  daily.   Constipation Stable. Continue MiraLax daily.  Dementia Progressing, continue Namenda, off Lorazepam prn for agitation    Depression due to dementia Improved mood, anxiety, agitation, and attempts to leave facility, off Sertraline  Adult failure to thrive Persisted, continue supportive measures.      Family/ staff Communication: SNF [facility]  Labs/tests ordered:  none

## 2016-06-25 ENCOUNTER — Non-Acute Institutional Stay (SKILLED_NURSING_FACILITY): Payer: Medicare Other | Admitting: Internal Medicine

## 2016-06-25 ENCOUNTER — Encounter: Payer: Self-pay | Admitting: Internal Medicine

## 2016-06-25 DIAGNOSIS — R627 Adult failure to thrive: Secondary | ICD-10-CM

## 2016-06-25 DIAGNOSIS — F0150 Vascular dementia without behavioral disturbance: Secondary | ICD-10-CM

## 2016-06-25 DIAGNOSIS — I10 Essential (primary) hypertension: Secondary | ICD-10-CM | POA: Diagnosis not present

## 2016-06-25 DIAGNOSIS — F0280 Dementia in other diseases classified elsewhere without behavioral disturbance: Secondary | ICD-10-CM | POA: Diagnosis not present

## 2016-06-25 DIAGNOSIS — R634 Abnormal weight loss: Secondary | ICD-10-CM

## 2016-06-25 DIAGNOSIS — F329 Major depressive disorder, single episode, unspecified: Secondary | ICD-10-CM

## 2016-06-25 DIAGNOSIS — F0393 Unspecified dementia, unspecified severity, with mood disturbance: Secondary | ICD-10-CM

## 2016-06-25 NOTE — Progress Notes (Signed)
Progress Note    Location:  [facility] Room Number: [id number] Place of Service:  SNF [phone]) Provider: Murray Hodgkins, MD  Patient Care Team: Kimber Relic, MD as PCP - General (Internal Medicine) [facility] Johnney Ou, NP as Nurse Practitioner (Nurse Practitioner)  Extended Emergency Contact Information Primary Emergency Contact: [facility] Address: [address] [place]          [name],  [address] Home Phone: [phone] Relation: None  Code Status:  DNR Goals of care: Advanced Directive information Advanced Directives 06/25/2016  Does Patient Have a Medical Advance Directive? Yes  Type of Advance Directive Living will;Out of facility DNR (pink MOST or yellow form)  Does patient want to make changes to medical advance directive? -  Copy of Healthcare Power of Attorney in Chart? -  Pre-existing out of facility DNR order (yellow form or pink MOST form) Yellow form placed in chart (order not valid for inpatient use);Pink MOST form placed in chart (order not valid for inpatient use)     Chief Complaint  Patient presents with  . Medical Management of Chronic Issues    routine visit    HPI:  Pt is a 83 y.o. female seen today for medical management of chronic diseases.     Past Medical History:  Diagnosis Date  . Adjustment disorder with depressed mood   . Anemia   . Asthma   . Constipation   . Coronary atherosclerosis of native coronary artery   . Dementia in conditions classified elsewhere without behavioral disturbance   . Depression   . DJD (degenerative joint disease)   . Edema   . GERD (gastroesophageal reflux disease)   . History of fall   . Hyperlipidemia   . Hypertension   . Insomnia   . Osteoporosis   . Pain in joint, pelvic region and thigh    left  . Right bundle branch block   . Unspecified constipation   . Unspecified urinary incontinence   . Urinary tract infection, site not specified    Past Surgical History:    Procedure Laterality Date  . ABDOMINAL HYSTERECTOMY    . CHOLECYSTECTOMY    . FEMUR IM NAIL  06/19/2011   Procedure: INTRAMEDULLARY (IM) NAIL FEMORAL;  Surgeon: Harvie Junior, MD;  Location: [facility] OR;  Service: Orthopedics;  Laterality: Left;  . SKIN CANCER EXCISION  02/15/2013   right forehead Dr. Irene Limbo    Allergies  Allergen Reactions  . Alendronate Sodium Other (See Comments)    Reaction:  Unknown   . Asmanex 120 Metered Doses [Mometasone Furoate] Other (See Comments)    Reaction:  Unknown   . Budesonide-Formoterol Fumarate Other (See Comments)    Reaction:  Unknown   . Fluticasone Propionate (Inhal) Other (See Comments)    Reaction:  Unknown   . Mometasone Furoate Other (See Comments)    Reaction:  Unknown   . Omeprazole Other (See Comments)    Reaction:  Unknown   . Valium [Diazepam] Other (See Comments)    Reaction:  Unknown     Allergies as of 06/25/2016      Reactions   Alendronate Sodium Other (See Comments)   Reaction:  Unknown    Asmanex 120 Metered Doses [mometasone Furoate] Other (See Comments)   Reaction:  Unknown    Budesonide-formoterol Fumarate Other (See Comments)   Reaction:  Unknown    Fluticasone Propionate (inhal) Other (See Comments)   Reaction:  Unknown    Mometasone  Furoate Other (See Comments)   Reaction:  Unknown    Omeprazole Other (See Comments)   Reaction:  Unknown    Valium [diazepam] Other (See Comments)   Reaction:  Unknown       Medication List       Accurate as of 06/25/16  2:09 PM. Always use your most recent med list.          acetaminophen 325 MG tablet Commonly known as:  TYLENOL Take 650 mg by mouth every 6 (six) hours as needed for mild pain or moderate pain.   ASPERCREME LIDOCAINE 4 % Ptch Generic drug:  Lidocaine Apply topically. Apply patch to left hip and left knee every 8 pm and remove patches in the morning.   aspirin 81 MG chewable tablet Chew 81 mg by mouth daily.   calcium-vitamin D 500-200 MG-UNIT  tablet Commonly known as:  OSCAL WITH D Take 1 tablet by mouth 2 (two) times daily.   CERTAVITE SENIOR/ANTIOXIDANT PO Take 1 tablet by mouth daily.   famotidine 20 MG tablet Commonly known as:  PEPCID Take 20 mg by mouth at bedtime.   feeding supplement Liqd Take 1 Container by mouth 2 (two) times daily between meals.   HYDROcodone-acetaminophen 5-325 MG tablet Commonly known as:  NORCO/VICODIN Take 1/2 tablet by mouth three times daily for pain   isosorbide dinitrate 10 MG tablet Commonly known as:  ISORDIL Take 15 mg by mouth. Take 15 mg twice daily   memantine 10 MG tablet Commonly known as:  NAMENDA Take 10 mg by mouth 2 (two) times daily.   polyethylene glycol packet Commonly known as:  MIRALAX / GLYCOLAX Take 17 g by mouth daily.   selenium sulfide 1 % Lotn Commonly known as:  SELSUN Apply 1 application topically. Shampoo on bath days every Wednesday and Saturday leave on 10-15 minutes, then rinse.       Review of Systems  Constitutional: Negative for diaphoresis and fever.  HENT: Negative for congestion and hearing loss.   Eyes: Negative for pain and discharge.  Respiratory: Negative for cough, shortness of breath and wheezing.   Cardiovascular: Negative for chest pain, palpitations and leg swelling.  Gastrointestinal: Negative for abdominal pain, constipation, diarrhea and vomiting.  Genitourinary: Negative for dysuria, frequency and urgency.  Musculoskeletal: Positive for arthralgias and back pain. Negative for myalgias and neck pain.       S/p Left thumb and left ankle fxs, s/p Ortho, WBAT. S/p right wrist fx, healed.  Left hip pain with ROM, but able to ambulates  Skin: Negative for rash.  Neurological: Negative for seizures and headaches.       Dementia. 09/10/12 MMSE; total 19/30   Psychiatric/Behavioral: Positive for confusion, decreased concentration and dysphoric mood. Negative for hallucinations and suicidal ideas. The patient is nervous/anxious.      Immunization History  Administered Date(s) Administered  . Influenza Whole 02/24/2012, 02/23/2013  . Influenza-Unspecified 03/04/2014, 01/08/2015, 02/04/2016  . PPD Test 06/12/2014  . Pneumococcal Polysaccharide-23 04/20/2008  . Td 04/21/2003   Pertinent  Health Maintenance Due  Topic Date Due  . DEXA SCAN  06/26/1991  . PNA vac Low Risk Adult (2 of 2 - PCV13) 04/20/2009  . INFLUENZA VACCINE  Completed   Fall Risk  03/11/2015  Falls in the past year? No  Risk for fall due to : Impaired balance/gait     Vitals:   06/25/16 1353  BP: 120/80  Pulse: 70  Resp: 18  Temp: 97.6 F (36.4 C)  Weight: 99 lb 9.6 oz (45.2 kg)  Height: 5\' 2"  (1.575 m)   Body mass index is 18.22 kg/m.  Wt Readings from Last 3 Encounters:  06/25/16 99 lb 9.6 oz (45.2 kg)  06/04/16 102 lb 6.4 oz (46.4 kg)  05/04/16 102 lb (46.3 kg)     Physical Exam  Constitutional: She appears well-developed and well-nourished.  HENT:  Head: Normocephalic and atraumatic.  Eyes: EOM are normal. Pupils are equal, round, and reactive to light. Right eye exhibits no discharge. Left eye exhibits no discharge.  Neck: Normal range of motion. Neck supple. No JVD present. No thyromegaly present.  Cardiovascular: Normal rate and regular rhythm.   No murmur heard. Pulmonary/Chest: No respiratory distress. She has no wheezes. She has no rales.  Abdominal: Soft. Bowel sounds are normal. She exhibits no distension. There is no tenderness.  Musculoskeletal: Normal range of motion. She exhibits tenderness. She exhibits no edema.  S/p Left thumb and left ankle fxs, right wrist fx All healed.. Left hip pain with ROM, able to ambulates w/o excessive pain.    Lymphadenopathy:    She has no cervical adenopathy.  Neurological: She is alert. She has normal reflexes. No cranial nerve deficit. She exhibits normal muscle tone. Coordination normal.  Skin: Skin is warm and dry. No rash noted. No erythema.  Healed site of excisional  biopsy of skin cancer of right forehead. 1.25 inch scar.  Psychiatric:  persisted confusion, pacing, restlessness, not sleep much at night     Labs reviewed:  Recent Labs  10/08/15 12/03/15 03/31/16  NA 142 145 145  K 4.2 4.4 4.5  BUN 30* 30* 30*  CREATININE 1.1 1.0 1.2*    Recent Labs  10/08/15 12/03/15 03/31/16  AST 28 20 18   ALT 23 11 9   ALKPHOS 106 69 69    Recent Labs  10/08/15 12/03/15  WBC 9.2 7.8  HGB 11.4* 11.1*  HCT 34* 33*  PLT 229 199   Lab Results  Component Value Date   TSH 2.87 03/31/2016   No results found for: HGBA1C Lab Results  Component Value Date   CHOL 131 01/09/2016   HDL 54 01/09/2016   LDLCALC 50 01/09/2016   TRIG 135 01/09/2016    Significant Diagnostic Results in last 30 days:  No results found.  Assessment/Plan 1. Vascular dementia without behavioral disturbance Stable. Benefits from memory Care activities.  2. Adult failure to thrive Related to poor appetite and caloric insufficient intake  3. Depression due to dementia Stable on current meds  4. Essential hypertension, benign controlled  5. Loss of weight Weight continues to trend down for the last year.

## 2016-08-31 ENCOUNTER — Encounter: Payer: Self-pay | Admitting: Nurse Practitioner

## 2016-08-31 ENCOUNTER — Non-Acute Institutional Stay (SKILLED_NURSING_FACILITY): Payer: Medicare Other | Admitting: Nurse Practitioner

## 2016-08-31 DIAGNOSIS — M150 Primary generalized (osteo)arthritis: Secondary | ICD-10-CM

## 2016-08-31 DIAGNOSIS — F329 Major depressive disorder, single episode, unspecified: Secondary | ICD-10-CM | POA: Diagnosis not present

## 2016-08-31 DIAGNOSIS — F0280 Dementia in other diseases classified elsewhere without behavioral disturbance: Secondary | ICD-10-CM | POA: Diagnosis not present

## 2016-08-31 DIAGNOSIS — K219 Gastro-esophageal reflux disease without esophagitis: Secondary | ICD-10-CM

## 2016-08-31 DIAGNOSIS — K5900 Constipation, unspecified: Secondary | ICD-10-CM | POA: Diagnosis not present

## 2016-08-31 DIAGNOSIS — I25700 Atherosclerosis of coronary artery bypass graft(s), unspecified, with unstable angina pectoris: Secondary | ICD-10-CM

## 2016-08-31 DIAGNOSIS — G4700 Insomnia, unspecified: Secondary | ICD-10-CM

## 2016-08-31 DIAGNOSIS — I10 Essential (primary) hypertension: Secondary | ICD-10-CM

## 2016-08-31 DIAGNOSIS — R627 Adult failure to thrive: Secondary | ICD-10-CM

## 2016-08-31 DIAGNOSIS — Z9181 History of falling: Secondary | ICD-10-CM | POA: Diagnosis not present

## 2016-08-31 DIAGNOSIS — F0150 Vascular dementia without behavioral disturbance: Secondary | ICD-10-CM | POA: Diagnosis not present

## 2016-08-31 DIAGNOSIS — F0393 Unspecified dementia, unspecified severity, with mood disturbance: Secondary | ICD-10-CM

## 2016-08-31 DIAGNOSIS — I200 Unstable angina: Secondary | ICD-10-CM | POA: Diagnosis not present

## 2016-08-31 DIAGNOSIS — M159 Polyosteoarthritis, unspecified: Secondary | ICD-10-CM

## 2016-08-31 NOTE — Assessment & Plan Note (Signed)
Multiple sites, continue Norco 1/2 tid, prn Tylenol.

## 2016-08-31 NOTE — Assessment & Plan Note (Signed)
Stable. Continue Famotidine 20mg daily.  

## 2016-08-31 NOTE — Assessment & Plan Note (Signed)
Sleeps better at night.  

## 2016-08-31 NOTE — Assessment & Plan Note (Signed)
Controlled, off  Metoprolol 25mg  daily, continue Imdur, update CBC CMP TSH

## 2016-08-31 NOTE — Assessment & Plan Note (Signed)
Progressing, continue Namenda, off Lorazepam prn for agitation

## 2016-08-31 NOTE — Assessment & Plan Note (Signed)
Improved mood, anxiety, agitation, and attempts to leave facility, off Sertraline  

## 2016-08-31 NOTE — Assessment & Plan Note (Signed)
Stable. Continue MiraLax daily.  

## 2016-08-31 NOTE — Assessment & Plan Note (Signed)
Less falling since she is more w/c dependent.

## 2016-08-31 NOTE — Progress Notes (Signed)
[facility]:  [facility] Room Number: 100 Place of Service:  SNF (31) Provider:  Odetta Forness, Manxie  NP  Kimber RelicGreen, Arthur G, MD  Patient Care Team: Kimber RelicGreen, Arthur G, MD as PCP - General (Internal Medicine) [facility] Andy X, NP as Nurse Practitioner (Nurse Practitioner)  Extended Emergency Contact Information Primary Emergency Contact: [facility],TERESA Address: [address]          [place],  [id number] Home Phone: [phone] Relation: None  Code Status:  DNR Goals of care: Advanced Directive information Advanced Directives 08/31/2016  Does Patient Have a Medical Advance Directive? Yes  Type of Advance Directive Living will;Out of facility DNR (pink MOST or yellow form)  Does patient want to make changes to medical advance directive? No - Patient declined  Copy of Healthcare Power of Attorney in Chart? -  Pre-existing out of facility DNR order (yellow form or pink MOST form) Yellow form placed in chart (order not valid for inpatient use)     Chief Complaint  Patient presents with  . Medical Management of Chronic Issues    HPI:  Pt is a 83 y.o. female seen today for medical management of chronic diseases.     Hx of dementia, resides in [facility], taking Namenda for memory, depression, off Sertraline, HTN, controlled on Imdur, constipation, stable on MirLax daily, GERD, stable while on Famotidine 20mg , FTT supportive measurs. Hx of right displaced radial and ulnar fxs, left ankle x, left thumb fx sustained from falling. Left hip pain is managed with Norco 5/325 1/2 tid.  Past Medical History:  Diagnosis Date  . Adjustment disorder with depressed mood   . Anemia   . Asthma   . Constipation   . Coronary atherosclerosis of native coronary artery   . Dementia in conditions classified elsewhere without behavioral disturbance   . Depression   . DJD (degenerative joint disease)   . Edema   . GERD (gastroesophageal reflux disease)   . History of  fall   . Hyperlipidemia   . Hypertension   . Insomnia   . Osteoporosis   . Pain in joint, pelvic region and thigh    left  . Right bundle branch block   . Unspecified constipation   . Unspecified urinary incontinence   . Urinary tract infection, site not specified    Past Surgical History:  Procedure Laterality Date  . ABDOMINAL HYSTERECTOMY    . CHOLECYSTECTOMY    . FEMUR IM NAIL  06/19/2011   Procedure: INTRAMEDULLARY (IM) NAIL FEMORAL;  Surgeon: Harvie JuniorJohn L Graves, MD;  Location: [facility] OR;  Service: Orthopedics;  Laterality: Left;  . SKIN CANCER EXCISION  02/15/2013   right forehead Dr. Irene LimboGoodrich    Allergies  Allergen Reactions  . Alendronate Sodium Other (See Comments)    Reaction:  Unknown   . Asmanex 120 Metered Doses [Mometasone Furoate] Other (See Comments)    Reaction:  Unknown   . Budesonide-Formoterol Fumarate Other (See Comments)    Reaction:  Unknown   . Fluticasone Propionate (Inhal) Other (See Comments)    Reaction:  Unknown   . Mometasone Furoate Other (See Comments)    Reaction:  Unknown   . Omeprazole Other (See Comments)    Reaction:  Unknown   . Valium [Diazepam] Other (See Comments)    Reaction:  Unknown     Allergies as of 08/31/2016      Reactions   Alendronate Sodium Other (See Comments)   Reaction:  Unknown  Asmanex 120 Metered Doses [mometasone Furoate] Other (See Comments)   Reaction:  Unknown    Budesonide-formoterol Fumarate Other (See Comments)   Reaction:  Unknown    Fluticasone Propionate (inhal) Other (See Comments)   Reaction:  Unknown    Mometasone Furoate Other (See Comments)   Reaction:  Unknown    Omeprazole Other (See Comments)   Reaction:  Unknown    Valium [diazepam] Other (See Comments)   Reaction:  Unknown       Medication List       Accurate as of 08/31/16  4:11 PM. Always use your most recent med list.          acetaminophen 325 MG tablet Commonly known as:  TYLENOL Take 650 mg by mouth every 6 (six) hours as  needed for mild pain or moderate pain.   ASPERCREME LIDOCAINE 4 % Ptch Generic drug:  Lidocaine Apply topically. Apply patch to left hip and left knee every 8 pm and remove patches in the morning.   aspirin 81 MG chewable tablet Chew 81 mg by mouth daily.   calcium-vitamin D 500-200 MG-UNIT tablet Commonly known as:  OSCAL WITH D Take 1 tablet by mouth 2 (two) times daily.   CERTAVITE SENIOR/ANTIOXIDANT PO Take 1 tablet by mouth daily.   famotidine 20 MG tablet Commonly known as:  PEPCID Take 20 mg by mouth at bedtime.   feeding supplement Liqd Take 1 Container by mouth 2 (two) times daily between meals.   HYDROcodone-acetaminophen 5-325 MG tablet Commonly known as:  NORCO/VICODIN Take 1/2 tablet by mouth three times daily for pain   isosorbide dinitrate 10 MG tablet Commonly known as:  ISORDIL Take 15 mg by mouth. Take 15 mg twice daily   memantine 10 MG tablet Commonly known as:  NAMENDA Take 10 mg by mouth 2 (two) times daily.   polyethylene glycol packet Commonly known as:  MIRALAX / GLYCOLAX Take 17 g by mouth daily.   selenium sulfide 1 % Lotn Commonly known as:  SELSUN Apply 1 application topically. Shampoo on bath days every Wednesday and Saturday leave on 10-15 minutes, then rinse.   sertraline 50 MG tablet Commonly known as:  ZOLOFT Take 50 mg by mouth daily.       Review of Systems  Constitutional: Negative for diaphoresis and fever.  HENT: Negative for congestion and hearing loss.   Eyes: Negative for pain and discharge.  Respiratory: Negative for cough, shortness of breath and wheezing.   Cardiovascular: Negative for chest pain, palpitations and leg swelling.  Gastrointestinal: Negative for abdominal pain, constipation, diarrhea and vomiting.  Genitourinary: Negative for dysuria, frequency and urgency.  Musculoskeletal: Positive for arthralgias and back pain. Negative for myalgias and neck pain.       Hx of Left thumb and left ankle fxs, right  wrist fx.  Left hip pain with ROM, but able to ambulate  Skin: Negative for rash.  Neurological: Negative for seizures and headaches.       Dementia. 09/10/12 MMSE; total 19/30   Psychiatric/Behavioral: Positive for confusion, decreased concentration and dysphoric mood. Negative for hallucinations and suicidal ideas. The patient is nervous/anxious.     Immunization History  Administered Date(s) Administered  . Influenza Whole 02/24/2012, 02/23/2013  . Influenza-Unspecified 03/04/2014, 01/08/2015, 02/04/2016  . PPD Test 06/12/2014  . Pneumococcal Polysaccharide-23 04/20/2008  . Td 04/21/2003   Pertinent  Health Maintenance Due  Topic Date Due  . DEXA SCAN  06/26/1991  . PNA vac Low Risk Adult (2  of 2 - PCV13) 04/20/2009  . INFLUENZA VACCINE  11/18/2016   Fall Risk  03/11/2015  Falls in the past year? No  Risk for fall due to : Impaired balance/gait   Functional Status Survey:    Vitals:   08/31/16 1328  BP: 120/70  Pulse: 70  Resp: 20  Temp: 97.7 F (36.5 C)  Weight: 96 lb (43.5 kg)  Height: 5\' 2"  (1.575 m)   Body mass index is 17.56 kg/m. Physical Exam  Constitutional: She appears well-developed and well-nourished.  HENT:  Head: Normocephalic and atraumatic.  Eyes: EOM are normal. Pupils are equal, round, and reactive to light. Right eye exhibits no discharge. Left eye exhibits no discharge.  Neck: Normal range of motion. Neck supple. No JVD present. No thyromegaly present.  Cardiovascular: Normal rate and regular rhythm.   No murmur heard. Pulmonary/Chest: No respiratory distress. She has no wheezes. She has no rales.  Abdominal: Soft. Bowel sounds are normal. She exhibits no distension. There is no tenderness.  Musculoskeletal: Normal range of motion. She exhibits tenderness. She exhibits no edema.  Hx of Left thumb and left ankle fxs,  right wrist fx. Left hip pain with ROM, managed with Norco 1/2 tab tid.    Lymphadenopathy:    She has no cervical adenopathy.   Neurological: She is alert. She has normal reflexes. No cranial nerve deficit. She exhibits normal muscle tone. Coordination normal.  Skin: Skin is warm and dry. No rash noted. No erythema.  Healed site of excisional biopsy of skin cancer of right forehead. 1.25 inch scar.  Psychiatric:  persisted confusion, pacing, restlessness, not sleep much at night     Labs reviewed:  Recent Labs  10/08/15 12/03/15 03/31/16  NA 142 145 145  K 4.2 4.4 4.5  BUN 30* 30* 30*  CREATININE 1.1 1.0 1.2*    Recent Labs  10/08/15 12/03/15 03/31/16  AST 28 20 18   ALT 23 11 9   ALKPHOS 106 69 69    Recent Labs  10/08/15 12/03/15  WBC 9.2 7.8  HGB 11.4* 11.1*  HCT 34* 33*  PLT 229 199   Lab Results  Component Value Date   TSH 2.87 03/31/2016   No results found for: HGBA1C Lab Results  Component Value Date   CHOL 131 01/09/2016   HDL 54 01/09/2016   LDLCALC 50 01/09/2016   TRIG 135 01/09/2016    Significant Diagnostic Results in last 30 days:  No results found.  Assessment/Plan Essential hypertension, benign Controlled, off  Metoprolol 25mg  daily, continue Imdur, update CBC CMP TSH  CAD (coronary artery disease) of artery bypass graft No c/o angina since last visited. Continue Isosorbide 30mg  daily, ASA 81mg     GERD (gastroesophageal reflux disease) Stable. Continue Famotidine 20mg  daily.   Constipation Stable. Continue MiraLax daily.  Dementia Progressing, continue Namenda, off Lorazepam prn for agitation    Depression due to dementia Improved mood, anxiety, agitation, and attempts to leave facility, off Sertraline  DJD (degenerative joint disease) Multiple sites, continue Norco 1/2 tid, prn Tylenol.   Adult failure to thrive Continue supportive care, update CBC CMP TSH  Insomnia Sleeps better at night.   History of fall Less falling since she is more w/c dependent.      Family/ staff Communication: SN[facility]  Labs/tests ordered:  CBC CMP TSH

## 2016-08-31 NOTE — Assessment & Plan Note (Signed)
No c/o angina since last visited. Continue Isosorbide 30mg  daily, ASA 81mg 

## 2016-08-31 NOTE — Assessment & Plan Note (Signed)
Continue supportive care, update CBC CMP TSH

## 2016-09-01 LAB — CBC AND DIFFERENTIAL
HEMATOCRIT: 38 (ref 36–46)
Hemoglobin: 12.1 (ref 12.0–16.0)
PLATELETS: 165 (ref 150–399)
WBC: 8.2

## 2016-09-01 LAB — BASIC METABOLIC PANEL
BUN: 26 — AB (ref 4–21)
CREATININE: 1 (ref <=1.1)
Glucose: 82
Potassium: 4.2 (ref 3.4–5.3)
Sodium: 141 (ref 137–147)

## 2016-09-01 LAB — TSH: TSH: 2.42 (ref <=5.90)

## 2016-09-01 LAB — HEPATIC FUNCTION PANEL
ALK PHOS: 65 (ref 25–125)
ALT: 11 (ref 7–35)
AST: 21 (ref 13–35)
BILIRUBIN, TOTAL: 0.4

## 2016-09-24 ENCOUNTER — Other Ambulatory Visit: Payer: Self-pay | Admitting: *Deleted

## 2016-10-15 ENCOUNTER — Non-Acute Institutional Stay (SKILLED_NURSING_FACILITY): Payer: Medicare Other | Admitting: Internal Medicine

## 2016-10-15 ENCOUNTER — Encounter: Payer: Self-pay | Admitting: Internal Medicine

## 2016-10-15 DIAGNOSIS — F0280 Dementia in other diseases classified elsewhere without behavioral disturbance: Secondary | ICD-10-CM

## 2016-10-15 DIAGNOSIS — I10 Essential (primary) hypertension: Secondary | ICD-10-CM | POA: Diagnosis not present

## 2016-10-15 DIAGNOSIS — F329 Major depressive disorder, single episode, unspecified: Secondary | ICD-10-CM

## 2016-10-15 DIAGNOSIS — E43 Unspecified severe protein-calorie malnutrition: Secondary | ICD-10-CM

## 2016-10-15 DIAGNOSIS — M47817 Spondylosis without myelopathy or radiculopathy, lumbosacral region: Secondary | ICD-10-CM | POA: Diagnosis not present

## 2016-10-15 DIAGNOSIS — F341 Dysthymic disorder: Secondary | ICD-10-CM

## 2016-10-15 DIAGNOSIS — G309 Alzheimer's disease, unspecified: Secondary | ICD-10-CM

## 2016-10-15 DIAGNOSIS — K5900 Constipation, unspecified: Secondary | ICD-10-CM

## 2016-10-15 DIAGNOSIS — K219 Gastro-esophageal reflux disease without esophagitis: Secondary | ICD-10-CM

## 2016-10-15 NOTE — Progress Notes (Signed)
Location:  [facility] Room Number: 100 Place of Service:  SNF [phone]) Provider:  Oneal Grout MD  Oneal Grout, MD  Patient Care Team: Oneal Grout, MD as PCP - General (Internal Medicine) [facility] Mast, Man X, NP as Nurse Practitioner (Nurse Practitioner)  Extended Emergency Contact Information Primary Emergency Contact: [facility] Address: [address] [place]          [name],  [address] Home Phone: [phone] Relation: None  Code Status:  DNR Goals of care: Advanced Directive information Advanced Directives 08/31/2016  Does Patient Have a Medical Advance Directive? Yes  Type of Advance Directive Living will;Out of facility DNR (pink MOST or yellow form)  Does patient want to make changes to medical advance directive? No - Patient declined  Copy of Healthcare Power of Attorney in Chart? -  Pre-existing out of facility DNR order (yellow form or pink MOST form) Yellow form placed in chart (order not valid for inpatient use)     Chief Complaint  Patient presents with  . Medical Management of Chronic Issues    Routine Visit    HPI:  Pt is a 83 y.o. female seen today for medical management of chronic diseases.  She has advanced dementia and does not participate in HPI and ROS. History obtained from nursing. She is under total care. No fall reported. No pressure ulcer or wound reported.    Past Medical History:  Diagnosis Date  . Adjustment disorder with depressed mood   . Anemia   . Asthma   . Constipation   . Coronary atherosclerosis of native coronary artery   . Dementia in conditions classified elsewhere without behavioral disturbance   . Depression   . DJD (degenerative joint disease)   . Edema   . GERD (gastroesophageal reflux disease)   . History of fall   . Hyperlipidemia   . Hypertension   . Insomnia   . Osteoporosis   . Pain in joint, pelvic region and thigh    left  . Right bundle branch block   . Unspecified  constipation   . Unspecified urinary incontinence   . Urinary tract infection, site not specified    Past Surgical History:  Procedure Laterality Date  . ABDOMINAL HYSTERECTOMY    . CHOLECYSTECTOMY    . FEMUR IM NAIL  06/19/2011   Procedure: INTRAMEDULLARY (IM) NAIL FEMORAL;  Surgeon: Harvie Junior, MD;  Location: [facility] OR;  Service: Orthopedics;  Laterality: Left;  . SKIN CANCER EXCISION  02/15/2013   right forehead Dr. Irene Limbo    Allergies  Allergen Reactions  . Alendronate Sodium Other (See Comments)    Reaction:  Unknown   . Asmanex 120 Metered Doses [Mometasone Furoate] Other (See Comments)    Reaction:  Unknown   . Budesonide-Formoterol Fumarate Other (See Comments)    Reaction:  Unknown   . Fluticasone Propionate (Inhal) Other (See Comments)    Reaction:  Unknown   . Mometasone Furoate Other (See Comments)    Reaction:  Unknown   . Omeprazole Other (See Comments)    Reaction:  Unknown   . Valium [Diazepam] Other (See Comments)    Reaction:  Unknown     Outpatient Encounter Prescriptions as of 10/15/2016  Medication Sig  . acetaminophen (TYLENOL) 325 MG tablet Take 650 mg by mouth every 6 (six) hours as needed for mild pain or moderate pain.  Marland Kitchen aspirin 81 MG chewable tablet Chew 81 mg by mouth daily.  . famotidine (PEPCID) 20  MG tablet Take 20 mg by mouth daily as needed.   . feeding supplement (BOOST / RESOURCE BREEZE) LIQD Take 1 Container by mouth 3 (three) times daily between meals.   Marland Kitchen. HYDROcodone-acetaminophen (NORCO/VICODIN) 5-325 MG tablet Take 1/2 tablet by mouth three times daily for pain  . isosorbide dinitrate (ISORDIL) 10 MG tablet Take 15 mg by mouth 2 (two) times daily.   . Lidocaine (ASPERCREME LIDOCAINE) 4 % PTCH Apply topically. Apply patch to left hip and left knee every 8 pm and remove patches in the morning.  . memantine (NAMENDA) 10 MG tablet Take 10 mg by mouth 2 (two) times daily.  . polyethylene glycol (MIRALAX / GLYCOLAX) packet Take 17 g by mouth  daily.  Marland Kitchen. selenium sulfide (SELSUN) 1 % LOTN Apply 1 application topically. Shampoo on bath days every Monday and Thursday leave on 10-15 minutes, then rinse.  . sertraline (ZOLOFT) 50 MG tablet Take 50 mg by mouth daily.  . [DISCONTINUED] calcium-vitamin D (OSCAL WITH D) 500-200 MG-UNIT per tablet Take 1 tablet by mouth 2 (two) times daily.  . [DISCONTINUED] Multiple Vitamins-Minerals (CERTAVITE SENIOR/ANTIOXIDANT PO) Take 1 tablet by mouth daily.   No facility-administered encounter medications on file as of 10/15/2016.     Review of Systems  Unable to perform ROS: Dementia    Immunization History  Administered Date(s) Administered  . Influenza Whole 02/24/2012, 02/23/2013  . Influenza-Unspecified 03/04/2014, 01/08/2015, 02/04/2016  . PPD Test 06/12/2014  . Pneumococcal Polysaccharide-23 04/20/2008  . Td 04/21/2003   Pertinent  Health Maintenance Due  Topic Date Due  . DEXA SCAN  04/20/2017 (Originally 06/26/1991)  . PNA vac Low Risk Adult (2 of 2 - PCV13) 04/20/2017 (Originally 04/20/2009)  . INFLUENZA VACCINE  11/18/2016   Fall Risk  03/11/2015  Falls in the past year? No  Risk for fall due to : Impaired balance/gait   Functional Status Survey:    Vitals:   10/15/16 1614  BP: 130/70  Pulse: 66  Resp: 18  Temp: 98.2 F (36.8 C)  TempSrc: Oral  Weight: 98 lb 6.4 oz (44.6 kg)  Height: 5\' 2"  (1.575 m)   Body mass index is 18 kg/m. Physical Exam  Constitutional:  Frail, elderly patient in no distress  HENT:  Head: Normocephalic and atraumatic.  Mouth/Throat: Oropharynx is clear and moist.  Eyes: Conjunctivae and EOM are normal. Pupils are equal, round, and reactive to light.  Neck: Normal range of motion. Neck supple.  Cardiovascular: Normal rate and regular rhythm.   Pulmonary/Chest: Effort normal and breath sounds normal. No respiratory distress. She has no wheezes. She has no rales.  Abdominal: Soft. Bowel sounds are normal. There is no tenderness. There is no  guarding.  Musculoskeletal: Normal range of motion.  Can move all 4 extremities, no leg edema, arthritis changes present, ambulates with Rothert and needs 1 person assistance  Lymphadenopathy:    She has no cervical adenopathy.  Neurological: She is alert.  Oriented only to self  Skin: Skin is warm and dry. She is not diaphoretic.  Psychiatric: She has a normal mood and affect.  Dementia present    Labs reviewed:  Recent Labs  12/03/15 03/31/16 09/01/16  NA 145 145 141  K 4.4 4.5 4.2  BUN 30* 30* 26*  CREATININE 1.0 1.2* 1.0    Recent Labs  12/03/15 03/31/16 09/01/16  AST 20 18 21   ALT 11 9 11   ALKPHOS 69 69 65    Recent Labs  12/03/15 09/01/16  WBC 7.8  8.2  HGB 11.1* 12.1  HCT 33* 38  PLT 199 165   Lab Results  Component Value Date   TSH 2.42 09/01/2016   No results found for: HGBA1C Lab Results  Component Value Date   CHOL 131 01/09/2016   HDL 54 01/09/2016   LDLCALC 50 01/09/2016   TRIG 135 01/09/2016    Significant Diagnostic Results in last 30 days:  No results found.  Assessment/Plan  Chronic depression Continue sertraline current regimen, no changes made  gerd Stable symptom, d/c famotidine and monitor  Constipation Continue daily miralax, no changes  HTN Stable BP, continue isodril and monitor, reviewed bmp  Advanced dementia Under total care, continue memantine  Lumbosacral spondylosis without myelopathy Currently on norco 5-325 mg half a tab tid, followed by hospice service. Continue lidocaine patch  Severe protein calorie malnutrition Decline anticipated with her dementia. Continue feeding supplement, assist with feeding, monitor   Family/ staff Communication: reviewed care plan with patient and charge nurse.    Labs/tests ordered:  none   Oneal Grout, MD Internal Medicine [facility] [address] [place], [place] [address] Cell Phone (Monday-Friday 8 am - 5 pm): [phone] On  Call: [phone] and follow prompts after 5 pm and on weekends Office Phone: [phone] Office Fax: [phone]

## 2016-11-09 ENCOUNTER — Non-Acute Institutional Stay (SKILLED_NURSING_FACILITY): Payer: Medicare Other | Admitting: Nurse Practitioner

## 2016-11-09 ENCOUNTER — Encounter: Payer: Self-pay | Admitting: Nurse Practitioner

## 2016-11-09 DIAGNOSIS — G309 Alzheimer's disease, unspecified: Secondary | ICD-10-CM | POA: Diagnosis not present

## 2016-11-09 DIAGNOSIS — K5900 Constipation, unspecified: Secondary | ICD-10-CM

## 2016-11-09 DIAGNOSIS — F0280 Dementia in other diseases classified elsewhere without behavioral disturbance: Secondary | ICD-10-CM

## 2016-11-09 DIAGNOSIS — K2900 Acute gastritis without bleeding: Secondary | ICD-10-CM

## 2016-11-09 DIAGNOSIS — K2970 Gastritis, unspecified, without bleeding: Secondary | ICD-10-CM | POA: Insufficient documentation

## 2016-11-09 NOTE — Assessment & Plan Note (Signed)
11/08/16 reported dark greenish and pinkish mucous BM x2 , she was subsequently placed on clear liquid diet for 24 hours. The patient appears lethargic today, afebrile, no further abnormal stools, she has no noted abd pain, nausea, or vomiting. She is under Hospice service: no hospitalization, No IVF, ABT as  Indicated. C-diff stool testing pending, will hold Senokot S and Miralax for loose stools, encourage oral fluid intake, observe the patient.

## 2016-11-09 NOTE — Progress Notes (Signed)
[facility]  [facility] Room Number: 100 Place of Service:  SNF (31) Provider:  Mast, Manxie  NP  Oneal GroutPandey, Mahima, MD  Patient Care Team: Oneal GroutPandey, Mahima, MD as PCP - General (Internal Medicine) [facility] Mast, Man X, NP as Nurse Practitioner (Nurse Practitioner)  Extended Emergency Contact Information Primary Emergency Contact: [facility] Address: [address]          [place],  [id number] Home Phone: [phone] Relation: None  Code Status: DNR Goals of care: Advanced Directive information Advanced Directives 11/09/2016  Does Patient Have a Medical Advance Directive? Yes  Type of Advance Directive Living will;Out of facility DNR (pink MOST or yellow form)  Does patient want to make changes to medical advance directive? No - Patient declined  Copy of Healthcare Power of Attorney in Chart? -  Pre-existing out of facility DNR order (yellow form or pink MOST form) Yellow form placed in chart (order not valid for inpatient use);Pink MOST form placed in chart (order not valid for inpatient use)     Chief Complaint  Patient presents with  . Acute Visit    ? C-Diff    HPI:  Pt is a 83 y.o. female seen today for an acute visit for    Past Medical History:  Diagnosis Date  . Adjustment disorder with depressed mood   . Anemia   . Asthma   . Constipation   . Coronary atherosclerosis of native coronary artery   . Dementia in conditions classified elsewhere without behavioral disturbance   . Depression   . DJD (degenerative joint disease)   . Edema   . GERD (gastroesophageal reflux disease)   . History of fall   . Hyperlipidemia   . Hypertension   . Insomnia   . Osteoporosis   . Pain in joint, pelvic region and thigh    left  . Right bundle branch block   . Unspecified constipation   . Unspecified urinary incontinence   . Urinary tract infection, site not specified    Past Surgical History:  Procedure Laterality Date  .  ABDOMINAL HYSTERECTOMY    . CHOLECYSTECTOMY    . FEMUR IM NAIL  06/19/2011   Procedure: INTRAMEDULLARY (IM) NAIL FEMORAL;  Surgeon: Harvie JuniorJohn L Graves, MD;  Location: [facility] OR;  Service: Orthopedics;  Laterality: Left;  . SKIN CANCER EXCISION  02/15/2013   right forehead Dr. Irene LimboGoodrich    Allergies  Allergen Reactions  . Alendronate Sodium Other (See Comments)    Reaction:  Unknown   . Asmanex 120 Metered Doses [Mometasone Furoate] Other (See Comments)    Reaction:  Unknown   . Budesonide-Formoterol Fumarate Other (See Comments)    Reaction:  Unknown   . Fluticasone Propionate (Inhal) Other (See Comments)    Reaction:  Unknown   . Mometasone Furoate Other (See Comments)    Reaction:  Unknown   . Omeprazole Other (See Comments)    Reaction:  Unknown   . Valium [Diazepam] Other (See Comments)    Reaction:  Unknown     Outpatient Encounter Prescriptions as of 11/09/2016  Medication Sig  . acetaminophen (TYLENOL) 325 MG tablet Take 650 mg by mouth every 6 (six) hours as needed for mild pain or moderate pain.  Marland Kitchen. aspirin 81 MG chewable tablet Chew 81 mg by mouth daily.  . feeding supplement (BOOST / RESOURCE BREEZE) LIQD Take 1 Container by mouth 3 (three) times daily between meals.   Marland Kitchen. HYDROcodone-acetaminophen (NORCO/VICODIN) 5-325 MG tablet Take  1/2 tablet by mouth three times daily for pain  . isosorbide dinitrate (ISORDIL) 10 MG tablet Take 15 mg by mouth 2 (two) times daily.   . Lidocaine (ASPERCREME LIDOCAINE) 4 % PTCH Apply topically. Apply patch to left hip and left knee every 8 pm and remove patches in the morning.  . memantine (NAMENDA) 10 MG tablet Take 10 mg by mouth 2 (two) times daily.  . polyethylene glycol (MIRALAX / GLYCOLAX) packet Take 17 g by mouth daily.  Marland Kitchen selenium sulfide (SELSUN) 1 % LOTN Apply 1 application topically. Shampoo on bath days every Monday and Thursday leave on 10-15 minutes, then rinse.  . sennosides-docusate sodium (SENOKOT-S) 8.6-50 MG tablet Take 1 tablet  by mouth daily. Taken at bedtime  . sertraline (ZOLOFT) 50 MG tablet Take 50 mg by mouth daily.  . [DISCONTINUED] famotidine (PEPCID) 20 MG tablet Take 20 mg by mouth daily as needed.    No facility-administered encounter medications on file as of 11/09/2016.     Review of Systems  Immunization History  Administered Date(s) Administered  . Influenza Whole 02/24/2012, 02/23/2013  . Influenza-Unspecified 03/04/2014, 01/08/2015, 02/04/2016  . PPD Test 06/12/2014  . Pneumococcal Polysaccharide-23 04/20/2008  . Td 04/21/2003   Pertinent  Health Maintenance Due  Topic Date Due  . DEXA SCAN  04/20/2017 (Originally 06/26/1991)  . PNA vac Low Risk Adult (2 of 2 - PCV13) 04/20/2017 (Originally 04/20/2009)  . INFLUENZA VACCINE  11/18/2016   Fall Risk  03/11/2015  Falls in the past year? No  Risk for fall due to : Impaired balance/gait   Functional Status Survey:    Vitals:   11/09/16 1212  BP: 120/62  Pulse: 72  Resp: 20  Temp: 98.5 F (36.9 C)  Weight: 95 lb 9.6 oz (43.4 kg)  Height: 5\' 2"  (1.575 m)   Body mass index is 17.49 kg/m. Physical Exam  Labs reviewed:  Recent Labs  12/03/15 03/31/16 09/01/16  NA 145 145 141  K 4.4 4.5 4.2  BUN 30* 30* 26*  CREATININE 1.0 1.2* 1.0    Recent Labs  12/03/15 03/31/16 09/01/16  AST 20 18 21   ALT 11 9 11   ALKPHOS 69 69 65    Recent Labs  12/03/15 09/01/16  WBC 7.8 8.2  HGB 11.1* 12.1  HCT 33* 38  PLT 199 165   Lab Results  Component Value Date   TSH 2.42 09/01/2016   No results found for: HGBA1C Lab Results  Component Value Date   CHOL 131 01/09/2016   HDL 54 01/09/2016   LDLCALC 50 01/09/2016   TRIG 135 01/09/2016    Significant Diagnostic Results in last 30 days:  No results found.  Assessment/Plan There are no diagnoses linked to this encounter.   Family/ staff Communication:   Labs/tests ordered:

## 2016-11-09 NOTE — Assessment & Plan Note (Signed)
Hold Senokot S and MiraLax for loose stools.

## 2016-11-09 NOTE — Progress Notes (Signed)
[facility]  [facility] Room Number: 100 Place of Service:  SNF (31) Provider:  Aprille Sawhney, Manxie  NP  Oneal GroutPandey, Mahima, MD  Patient Care Team: Oneal GroutPandey, Mahima, MD as PCP - General (Internal Medicine) [facility] Jerime Arif X, NP as Nurse Practitioner (Nurse Practitioner)  Extended Emergency Contact Information Primary Emergency Contact: [facility] Address: [address]          [place],  [id number] Home Phone: [phone] Relation: None  Code Status:  DNR Goals of care: Advanced Directive information Advanced Directives 11/09/2016  Does Patient Have a Medical Advance Directive? Yes  Type of Advance Directive Living will;Out of facility DNR (pink MOST or yellow form)  Does patient want to make changes to medical advance directive? No - Patient declined  Copy of Healthcare Power of Attorney in Chart? -  Pre-existing out of facility DNR order (yellow form or pink MOST form) Yellow form placed in chart (order not valid for inpatient use);Pink MOST form placed in chart (order not valid for inpatient use)     Chief Complaint  Patient presents with  . Acute Visit    ? C-Diff    HPI:  Pt is a 83 y.o. female seen today for 11/08/16 reported dark greenish and pinkish mucous BM x2 , she was subsequently placed on clear liquid diet for 24 hours. The patient appears lethargic today, afebrile, no further abnormal stools, she has no noted abd pain, nausea, or vomiting. She is under Hospice service: no hospitalization, No IVF, ABT as  Indicated.    Hx of dementia, resides in [facility], taking Namenda for memory,  constipation, taking MirLax and Senokot S daily.  FTT supportive measurs. Hx of right displaced radial and ulnar fxs, left ankle x, left thumb fx sustained from falling. Left hip pain is managed with Norco 5/325 1/2 tid.  Past Medical History:  Diagnosis Date  . Adjustment disorder with depressed mood   . Anemia   . Asthma   . Constipation   .  Coronary atherosclerosis of native coronary artery   . Dementia in conditions classified elsewhere without behavioral disturbance   . Depression   . DJD (degenerative joint disease)   . Edema   . GERD (gastroesophageal reflux disease)   . History of fall   . Hyperlipidemia   . Hypertension   . Insomnia   . Osteoporosis   . Pain in joint, pelvic region and thigh    left  . Right bundle branch block   . Unspecified constipation   . Unspecified urinary incontinence   . Urinary tract infection, site not specified    Past Surgical History:  Procedure Laterality Date  . ABDOMINAL HYSTERECTOMY    . CHOLECYSTECTOMY    . FEMUR IM NAIL  06/19/2011   Procedure: INTRAMEDULLARY (IM) NAIL FEMORAL;  Surgeon: Harvie JuniorJohn L Graves, MD;  Location: [facility] OR;  Service: Orthopedics;  Laterality: Left;  . SKIN CANCER EXCISION  02/15/2013   right forehead Dr. Irene LimboGoodrich    Allergies  Allergen Reactions  . Alendronate Sodium Other (See Comments)    Reaction:  Unknown   . Asmanex 120 Metered Doses [Mometasone Furoate] Other (See Comments)    Reaction:  Unknown   . Budesonide-Formoterol Fumarate Other (See Comments)    Reaction:  Unknown   . Fluticasone Propionate (Inhal) Other (See Comments)    Reaction:  Unknown   . Mometasone Furoate Other (See Comments)    Reaction:  Unknown   . Omeprazole Other (  See Comments)    Reaction:  Unknown   . Valium [Diazepam] Other (See Comments)    Reaction:  Unknown     Allergies as of 11/09/2016      Reactions   Alendronate Sodium Other (See Comments)   Reaction:  Unknown    Asmanex 120 Metered Doses [mometasone Furoate] Other (See Comments)   Reaction:  Unknown    Budesonide-formoterol Fumarate Other (See Comments)   Reaction:  Unknown    Fluticasone Propionate (inhal) Other (See Comments)   Reaction:  Unknown    Mometasone Furoate Other (See Comments)   Reaction:  Unknown    Omeprazole Other (See Comments)   Reaction:  Unknown    Valium [diazepam] Other (See  Comments)   Reaction:  Unknown       Medication List       Accurate as of 11/09/16  1:02 PM. Always use your most recent med list.          acetaminophen 325 MG tablet Commonly known as:  TYLENOL Take 650 mg by mouth every 6 (six) hours as needed for mild pain or moderate pain.   ASPERCREME LIDOCAINE 4 % Ptch Generic drug:  Lidocaine Apply topically. Apply patch to left hip and left knee every 8 pm and remove patches in the morning.   aspirin 81 MG chewable tablet Chew 81 mg by mouth daily.   feeding supplement Liqd Take 1 Container by mouth 3 (three) times daily between meals.   HYDROcodone-acetaminophen 5-325 MG tablet Commonly known as:  NORCO/VICODIN Take 1/2 tablet by mouth three times daily for pain   isosorbide dinitrate 10 MG tablet Commonly known as:  ISORDIL Take 15 mg by mouth 2 (two) times daily.   memantine 10 MG tablet Commonly known as:  NAMENDA Take 10 mg by mouth 2 (two) times daily.   polyethylene glycol packet Commonly known as:  MIRALAX / GLYCOLAX Take 17 g by mouth daily.   selenium sulfide 1 % Lotn Commonly known as:  SELSUN Apply 1 application topically. Shampoo on bath days every Monday and Thursday leave on 10-15 minutes, then rinse.   sennosides-docusate sodium 8.6-50 MG tablet Commonly known as:  SENOKOT-S Take 1 tablet by mouth daily. Taken at bedtime   sertraline 50 MG tablet Commonly known as:  ZOLOFT Take 50 mg by mouth daily.       Review of Systems  Constitutional: Positive for activity change, appetite change and fatigue. Negative for chills, diaphoresis and fever.  HENT: Negative for congestion and hearing loss.   Eyes: Negative for redness.  Respiratory: Negative for cough and shortness of breath.   Cardiovascular: Negative for chest pain, palpitations and leg swelling.  Gastrointestinal: Positive for diarrhea. Negative for abdominal pain, nausea and vomiting.       11/08/16 reported dark greenish and pinkish mucous BM  x2    Genitourinary: Negative for dysuria, frequency and urgency.  Musculoskeletal: Positive for arthralgias and back pain.       Hx of Left thumb and left ankle fxs, right wrist fx.   Skin: Negative for rash.  Neurological: Negative for seizures and headaches.       Dementia. 09/10/12 MMSE; total 19/30   Psychiatric/Behavioral: Positive for confusion. Negative for hallucinations. The patient is not nervous/anxious.     Immunization History  Administered Date(s) Administered  . Influenza Whole 02/24/2012, 02/23/2013  . Influenza-Unspecified 03/04/2014, 01/08/2015, 02/04/2016  . PPD Test 06/12/2014  . Pneumococcal Polysaccharide-23 04/20/2008  . Td 04/21/2003   Pertinent  Health Maintenance Due  Topic Date Due  . DEXA SCAN  04/20/2017 (Originally 06/26/1991)  . PNA vac Low Risk Adult (2 of 2 - PCV13) 04/20/2017 (Originally 04/20/2009)  . INFLUENZA VACCINE  11/18/2016   Fall Risk  03/11/2015  Falls in the past year? No  Risk for fall due to : Impaired balance/gait   Functional Status Survey:    Vitals:   11/09/16 1212  BP: 120/62  Pulse: 72  Resp: 20  Temp: 98.5 F (36.9 C)  Weight: 95 lb 9.6 oz (43.4 kg)  Height: 5\' 2"  (1.575 m)   Body mass index is 17.49 kg/m. Physical Exam  Constitutional: She appears well-developed and well-nourished.  Thin and frail female.   HENT:  Head: Normocephalic and atraumatic.  Eyes: Pupils are equal, round, and reactive to light. EOM are normal. Right eye exhibits no discharge. Left eye exhibits no discharge.  Neck: Normal range of motion. Neck supple. No thyromegaly present.  Cardiovascular: Normal rate and regular rhythm.   No murmur heard. Pulmonary/Chest: No respiratory distress. She has no rales.  Abdominal: Soft. Bowel sounds are normal. She exhibits no distension. There is no tenderness.  Musculoskeletal: Normal range of motion. She exhibits tenderness. She exhibits no edema.  Hx of Left thumb and left ankle fxs,  right wrist fx.  Left hip pain with ROM, managed with Norco 1/2 tab tid.    Neurological: She is alert. She has normal reflexes. No cranial nerve deficit. She exhibits normal muscle tone. Coordination normal.  Skin: Skin is warm and dry. No rash noted.  Healed site of excisional biopsy of skin cancer of right forehead. 1.25 inch scar.  Psychiatric:  persisted confusion    Labs reviewed:  Recent Labs  12/03/15 03/31/16 09/01/16  NA 145 145 141  K 4.4 4.5 4.2  BUN 30* 30* 26*  CREATININE 1.0 1.2* 1.0    Recent Labs  12/03/15 03/31/16 09/01/16  AST 20 18 21   ALT 11 9 11   ALKPHOS 69 69 65    Recent Labs  12/03/15 09/01/16  WBC 7.8 8.2  HGB 11.1* 12.1  HCT 33* 38  PLT 199 165   Lab Results  Component Value Date   TSH 2.42 09/01/2016   No results found for: HGBA1C Lab Results  Component Value Date   CHOL 131 01/09/2016   HDL 54 01/09/2016   LDLCALC 50 01/09/2016   TRIG 135 01/09/2016    Significant Diagnostic Results in last 30 days:  No results found.  Assessment/Plan Gastritis 11/08/16 reported dark greenish and pinkish mucous BM x2 , she was subsequently placed on clear liquid diet for 24 hours. The patient appears lethargic today, afebrile, no further abnormal stools, she has no noted abd pain, nausea, or vomiting. She is under Hospice service: no hospitalization, No IVF, ABT as  Indicated. C-diff stool testing pending, will hold Senokot S and Miralax for loose stools, encourage oral fluid intake, observe the patient.     Constipation Hold Senokot S and MiraLax for loose stools.   Dementia Progressing, continue Namenda, supportive measures.         Family/ staff Communication: SN[facility]  Labs/tests ordered: pending C-diff

## 2016-11-09 NOTE — Assessment & Plan Note (Signed)
Progressing, continue Namenda, supportive measures.

## 2016-11-20 ENCOUNTER — Encounter: Payer: Self-pay | Admitting: Nurse Practitioner

## 2016-11-20 ENCOUNTER — Non-Acute Institutional Stay (SKILLED_NURSING_FACILITY): Payer: Medicare Other | Admitting: Nurse Practitioner

## 2016-11-20 DIAGNOSIS — I10 Essential (primary) hypertension: Secondary | ICD-10-CM

## 2016-11-20 DIAGNOSIS — K5900 Constipation, unspecified: Secondary | ICD-10-CM | POA: Diagnosis not present

## 2016-11-20 DIAGNOSIS — F0393 Unspecified dementia, unspecified severity, with mood disturbance: Secondary | ICD-10-CM

## 2016-11-20 DIAGNOSIS — R627 Adult failure to thrive: Secondary | ICD-10-CM | POA: Diagnosis not present

## 2016-11-20 DIAGNOSIS — K219 Gastro-esophageal reflux disease without esophagitis: Secondary | ICD-10-CM | POA: Diagnosis not present

## 2016-11-20 DIAGNOSIS — F0390 Unspecified dementia without behavioral disturbance: Secondary | ICD-10-CM | POA: Diagnosis not present

## 2016-11-20 DIAGNOSIS — F329 Major depressive disorder, single episode, unspecified: Secondary | ICD-10-CM

## 2016-11-20 DIAGNOSIS — F0280 Dementia in other diseases classified elsewhere without behavioral disturbance: Secondary | ICD-10-CM | POA: Diagnosis not present

## 2016-11-20 NOTE — Assessment & Plan Note (Addendum)
Improved mood, anxiety, agitation, and attempts to leave facility, continue Sertraline 50mg  po daily.

## 2016-11-20 NOTE — Assessment & Plan Note (Signed)
Progressing, continue supportive measures. Off Namenda.

## 2016-11-20 NOTE — Progress Notes (Signed)
[facility]  [facility] Room Number: 100 Place of Service:  SNF (31) Provider:  Thedore Pickel, Manxie  NP  Oneal GroutPandey, Mahima, MD  Patient Care Team: Oneal GroutPandey, Mahima, MD as PCP - General (Internal Medicine) [facility] Jonmarc Bodkin X, NP as Nurse Practitioner (Nurse Practitioner)  Extended Emergency Contact Information Primary Emergency Contact: [facility] Address: [address]          [place],  [id number] Home Phone: [phone] Relation: None  Code Status:  DNR Goals of care: Advanced Directive information Advanced Directives 11/20/2016  Does Patient Have a Medical Advance Directive? Yes  Type of Advance Directive Living will;Out of facility DNR (pink MOST or yellow form)  Does patient want to make changes to medical advance directive? No - Patient declined  Copy of Healthcare Power of Attorney in Chart? -  Pre-existing out of facility DNR order (yellow form or pink MOST form) Yellow form placed in chart (order not valid for inpatient use)     Chief Complaint  Patient presents with  . Medical Management of Chronic Issues    HPI:  Pt is a 83 y.o. female seen today for medical management of chronic diseases.     Hx of dementia, resides in [facility] Care Unit of [facility]F [facility], off Namenda, depression, mood is managed with Sertraline 50mg  qd, HTN, controlled on Imdur 15mg  bid, constipation, stable on MirLax daily and Senokot S I daily.  GERD, stable off Famotidine 20mg , FTT supportive measurs. Hx of right displaced radial and ulnar fxs, left ankle x, left thumb fx sustained from falls. Left hip pain is managed with prn Tylenol.     Past Medical History:  Diagnosis Date  . Adjustment disorder with depressed mood   . Anemia   . Asthma   . Constipation   . Coronary atherosclerosis of native coronary artery   . Dementia in conditions classified elsewhere without behavioral disturbance   . Depression   . DJD (degenerative joint disease)   . Edema   . GERD  (gastroesophageal reflux disease)   . History of fall   . Hyperlipidemia   . Hypertension   . Insomnia   . Osteoporosis   . Pain in joint, pelvic region and thigh    left  . Right bundle branch block   . Unspecified constipation   . Unspecified urinary incontinence   . Urinary tract infection, site not specified    Past Surgical History:  Procedure Laterality Date  . ABDOMINAL HYSTERECTOMY    . CHOLECYSTECTOMY    . FEMUR IM NAIL  06/19/2011   Procedure: INTRAMEDULLARY (IM) NAIL FEMORAL;  Surgeon: Harvie JuniorJohn L Graves, MD;  Location: [facility] OR;  Service: Orthopedics;  Laterality: Left;  . SKIN CANCER EXCISION  02/15/2013   right forehead Dr. Irene LimboGoodrich    Allergies  Allergen Reactions  . Alendronate Sodium Other (See Comments)    Reaction:  Unknown   . Asmanex 120 Metered Doses [Mometasone Furoate] Other (See Comments)    Reaction:  Unknown   . Budesonide-Formoterol Fumarate Other (See Comments)    Reaction:  Unknown   . Fluticasone Propionate (Inhal) Other (See Comments)    Reaction:  Unknown   . Mometasone Furoate Other (See Comments)    Reaction:  Unknown   . Omeprazole Other (See Comments)    Reaction:  Unknown   . Valium [Diazepam] Other (See Comments)    Reaction:  Unknown     Outpatient Encounter Prescriptions as of 11/20/2016  Medication Sig  .  acetaminophen (TYLENOL) 325 MG tablet Take 650 mg by mouth every 6 (six) hours as needed for mild pain or moderate pain.  Marland Kitchen aspirin 81 MG chewable tablet Chew 81 mg by mouth daily.  . feeding supplement (BOOST / RESOURCE BREEZE) LIQD Take 1 Container by mouth 3 (three) times daily between meals.   . isosorbide dinitrate (ISORDIL) 10 MG tablet Take 15 mg by mouth 2 (two) times daily.   . polyethylene glycol (MIRALAX / GLYCOLAX) packet Take 17 g by mouth daily.  Marland Kitchen selenium sulfide (SELSUN) 1 % LOTN Apply 1 application topically. Shampoo on bath days every Monday and Thursday leave on 10-15 minutes, then rinse.  . sennosides-docusate sodium  (SENOKOT-S) 8.6-50 MG tablet Take 1 tablet by mouth daily. Taken at bedtime  . sertraline (ZOLOFT) 50 MG tablet Take 50 mg by mouth daily.  . [DISCONTINUED] HYDROcodone-acetaminophen (NORCO/VICODIN) 5-325 MG tablet Take 1/2 tablet by mouth three times daily for pain  . [DISCONTINUED] Lidocaine (ASPERCREME LIDOCAINE) 4 % PTCH Apply topically. Apply patch to left hip and left knee every 8 pm and remove patches in the morning.  . [DISCONTINUED] memantine (NAMENDA) 10 MG tablet Take 10 mg by mouth 2 (two) times daily.   No facility-administered encounter medications on file as of 11/20/2016.     Review of Systems  Constitutional: Negative for activity change, appetite change and fatigue.  HENT: Negative for congestion, hearing loss and rhinorrhea.   Eyes: Negative for visual disturbance.  Respiratory: Negative for cough and shortness of breath.   Cardiovascular: Negative for chest pain, palpitations and leg swelling.  Gastrointestinal: Negative for abdominal distention, abdominal pain, constipation and diarrhea.  Genitourinary: Negative for dysuria, frequency and urgency.  Musculoskeletal: Positive for arthralgias, back pain and gait problem.       Hx of Left thumb and left ankle fxs, right wrist fx.  Left hip pain with ROM, but able to ambulate with Balicki with assistance x1  Skin: Negative for rash.       Reported small bruise left mid finger, able to make a fist with no pain noted.   Neurological: Negative for tremors, speech difficulty and headaches.       Dementia. 09/10/12 MMSE; total 19/30   Psychiatric/Behavioral: Positive for confusion. Negative for hallucinations. The patient is not nervous/anxious.     Immunization History  Administered Date(s) Administered  . Influenza Whole 02/24/2012, 02/23/2013  . Influenza-Unspecified 03/04/2014, 01/08/2015, 02/04/2016  . PPD Test 06/12/2014  . Pneumococcal Polysaccharide-23 04/20/2008  . Td 04/21/2003   Pertinent  Health Maintenance Due    Topic Date Due  . INFLUENZA VACCINE  11/18/2016  . DEXA SCAN  04/20/2017 (Originally 06/26/1991)  . PNA vac Low Risk Adult (2 of 2 - PCV13) 04/20/2017 (Originally 04/20/2009)   Fall Risk  03/11/2015  Falls in the past year? No  Risk for fall due to : Impaired balance/gait   Functional Status Survey:    Vitals:   11/20/16 1205  BP: (!) 98/52  Pulse: 64  Resp: 18  Temp: 97.7 F (36.5 C)  Weight: 93 lb 1.6 oz (42.2 kg)  Height: 5\' 2"  (1.575 m)   Body mass index is 17.03 kg/m. Physical Exam  Constitutional: She appears well-developed and well-nourished.  Thin and frail female.   HENT:  Head: Normocephalic and atraumatic.  Eyes: Pupils are equal, round, and reactive to light. EOM are normal. Right eye exhibits no discharge. Left eye exhibits no discharge.  Neck: Normal range of motion. Neck supple.  Cardiovascular:  Normal rate and regular rhythm.   No murmur heard. Pulmonary/Chest: She has no wheezes. She has no rales.  Abdominal: Soft. Bowel sounds are normal. She exhibits no distension. There is no tenderness.  Musculoskeletal: Normal range of motion. She exhibits no edema or tenderness.  Hx of Left thumb and left ankle fxs,  right wrist fx. Left hip pain with ROM, managed with Norco 1/2 tab tid.    Neurological: She is alert. She exhibits normal muscle tone.  dementia  Skin: Skin is warm and dry. No erythema.  Healed site of excisional biopsy of skin cancer of right forehead. 1.25 inch scar. Small bruise left 3rd finger  Psychiatric:  confusion    Labs reviewed:  Recent Labs  12/03/15 03/31/16 09/01/16  NA 145 145 141  K 4.4 4.5 4.2  BUN 30* 30* 26*  CREATININE 1.0 1.2* 1.0    Recent Labs  12/03/15 03/31/16 09/01/16  AST 20 18 21   ALT 11 9 11   ALKPHOS 69 69 65    Recent Labs  12/03/15 09/01/16  WBC 7.8 8.2  HGB 11.1* 12.1  HCT 33* 38  PLT 199 165   Lab Results  Component Value Date   TSH 2.42 09/01/2016   No results found for: HGBA1C Lab Results   Component Value Date   CHOL 131 01/09/2016   HDL 54 01/09/2016   LDLCALC 50 01/09/2016   TRIG 135 01/09/2016    Significant Diagnostic Results in last 30 days:  No results found.  Assessment/Plan Essential hypertension, benign Controlled, continue Imdur 15mg  bid po. Continue ASA 81mg  po daily for cardiovascular risk reduction.   GERD (gastroesophageal reflux disease) Stable, off acid reducer.   Constipation Stable, continue Senokot S I daily and MiraLax daily.    Dementia Progressing, continue supportive measures. Off Namenda.   Depression due to dementia Improved mood, anxiety, agitation, and attempts to leave facility, continue Sertraline 50mg  po daily.   Adult failure to thrive Continue supportive care     Family/ staff Communication: plan of care reviewed with the patient and charge nurse.   Labs/tests ordered:  None  Time spend: 45 minutes

## 2016-11-20 NOTE — Assessment & Plan Note (Signed)
Continue supportive care

## 2016-11-20 NOTE — Assessment & Plan Note (Signed)
Stable, off acid reducer 

## 2016-11-20 NOTE — Assessment & Plan Note (Signed)
Stable, continue Senokot S I daily and MiraLax daily.

## 2016-11-20 NOTE — Assessment & Plan Note (Signed)
Controlled, continue Imdur 15mg  bid po. Continue ASA 81mg  po daily for cardiovascular risk reduction.

## 2016-12-24 ENCOUNTER — Non-Acute Institutional Stay (SKILLED_NURSING_FACILITY): Payer: Medicare Other

## 2016-12-24 DIAGNOSIS — Z0000 Encounter for general adult medical examination without abnormal findings: Secondary | ICD-10-CM

## 2016-12-24 NOTE — Patient Instructions (Signed)
Joann Baker , Thank you for taking time to come for your Medicare Wellness Visit. I appreciate your ongoing commitment to your health goals. Please review the following plan we discussed and let me know if I can assist you in the future.   Screening recommendations/referrals: Colonoscopy excluded, pt over age 81 Mammogram excluded, pt over age 975 Bone Density up to date Recommended yearly ophthalmology/optometry visit for glaucoma screening and checkup Recommended yearly dental visit for hygiene and checkup  Vaccinations: Influenza vaccine due, not ordered pt is hospice Pneumococcal vaccine 23 due, not ordered pt is hospice Tdap vaccine up to date. Shingles vaccine not in records  Advanced directives: DNR in chart, copies of health care power of attorney and living will are needed   Conditions/risks identified: None  Next appointment: Dr. Glade LloydPandey makes rounds   Preventive Care 65 Years and Older, Female Preventive care refers to lifestyle choices and visits with your health care provider that can promote health and wellness. What does preventive care include?  A yearly physical exam. This is also called an annual well check.  Dental exams once or twice a year.  Routine eye exams. Ask your health care provider how often you should have your eyes checked.  Personal lifestyle choices, including:  Daily care of your teeth and gums.  Regular physical activity.  Eating a healthy diet.  Avoiding tobacco and drug use.  Limiting alcohol use.  Practicing safe sex.  Taking low-dose aspirin every day.  Taking vitamin and mineral supplements as recommended by your health care provider. What happens during an annual well check? The services and screenings done by your health care provider during your annual well check will depend on your age, overall health, lifestyle risk factors, and family history of disease. Counseling  Your health care provider may ask you questions about  your:  Alcohol use.  Tobacco use.  Drug use.  Emotional well-being.  Home and relationship well-being.  Sexual activity.  Eating habits.  History of falls.  Memory and ability to understand (cognition).  Work and work Astronomerenvironment.  Reproductive health. Screening  You may have the following tests or measurements:  Height, weight, and BMI.  Blood pressure.  Lipid and cholesterol levels. These may be checked every 5 years, or more frequently if you are over 81 years old.  Skin check.  Lung cancer screening. You may have this screening every year starting at age 81 if you have a 30-pack-year history of smoking and currently smoke or have quit within the past 15 years.  Fecal occult blood test (FOBT) of the stool. You may have this test every year starting at age 81.  Flexible sigmoidoscopy or colonoscopy. You may have a sigmoidoscopy every 5 years or a colonoscopy every 10 years starting at age 81.  Hepatitis C blood test.  Hepatitis B blood test.  Sexually transmitted disease (STD) testing.  Diabetes screening. This is done by checking your blood sugar (glucose) after you have not eaten for a while (fasting). You may have this done every 1-3 years.  Bone density scan. This is done to screen for osteoporosis. You may have this done starting at age 81.  Mammogram. This may be done every 1-2 years. Talk to your health care provider about how often you should have regular mammograms. Talk with your health care provider about your test results, treatment options, and if necessary, the need for more tests. Vaccines  Your health care provider may recommend certain vaccines, such as:  Influenza  vaccine. This is recommended every year.  Tetanus, diphtheria, and acellular pertussis (Tdap, Td) vaccine. You may need a Td booster every 10 years.  Zoster vaccine. You may need this after age 32.  Pneumococcal 13-valent conjugate (PCV13) vaccine. One dose is recommended  after age 67.  Pneumococcal polysaccharide (PPSV23) vaccine. One dose is recommended after age 31. Talk to your health care provider about which screenings and vaccines you need and how often you need them. This information is not intended to replace advice given to you by your health care provider. Make sure you discuss any questions you have with your health care provider. Document Released: 05/03/2015 Document Revised: 12/25/2015 Document Reviewed: 02/05/2015 Elsevier Interactive Patient Education  2017 [place] Prevention in the Home Falls can cause injuries. They can happen to people of all ages. There are many things you can do to make your home safe and to help prevent falls. What can I do on the outside of my home?  Regularly fix the edges of walkways and driveways and fix any cracks.  Remove anything that might make you trip as you walk through a door, such as a raised step or threshold.  Trim any bushes or trees on the path to your home.  Use bright outdoor lighting.  Clear any walking paths of anything that might make someone trip, such as rocks or tools.  Regularly check to see if handrails are loose or broken. Make sure that both sides of any steps have handrails.  Any raised decks and porches should have guardrails on the edges.  Have any leaves, snow, or ice cleared regularly.  Use sand or salt on walking paths during winter.  Clean up any spills in your garage right away. This includes oil or grease spills. What can I do in the bathroom?  Use night lights.  Install grab bars by the toilet and in the tub and shower. Do not use towel bars as grab bars.  Use non-skid mats or decals in the tub or shower.  If you need to sit down in the shower, use a plastic, non-slip stool.  Keep the floor dry. Clean up any water that spills on the floor as soon as it happens.  Remove soap buildup in the tub or shower regularly.  Attach bath mats securely with  double-sided non-slip rug tape.  Do not have throw rugs and other things on the floor that can make you trip. What can I do in the bedroom?  Use night lights.  Make sure that you have a light by your bed that is easy to reach.  Do not use any sheets or blankets that are too big for your bed. They should not hang down onto the floor.  Have a firm chair that has side arms. You can use this for support while you get dressed.  Do not have throw rugs and other things on the floor that can make you trip. What can I do in the kitchen?  Clean up any spills right away.  Avoid walking on wet floors.  Keep items that you use a lot in easy-to-reach places.  If you need to reach something above you, use a strong step stool that has a grab bar.  Keep electrical cords out of the way.  Do not use floor polish or wax that makes floors slippery. If you must use wax, use non-skid floor wax.  Do not have throw rugs and other things on the floor that can  make you trip. What can I do with my stairs?  Do not leave any items on the stairs.  Make sure that there are handrails on both sides of the stairs and use them. Fix handrails that are broken or loose. Make sure that handrails are as long as the stairways.  Check any carpeting to make sure that it is firmly attached to the stairs. Fix any carpet that is loose or worn.  Avoid having throw rugs at the top or bottom of the stairs. If you do have throw rugs, attach them to the floor with carpet tape.  Make sure that you have a light switch at the top of the stairs and the bottom of the stairs. If you do not have them, ask someone to add them for you. What else can I do to help prevent falls?  Wear shoes that:  Do not have high heels.  Have rubber bottoms.  Are comfortable and fit you well.  Are closed at the toe. Do not wear sandals.  If you use a stepladder:  Make sure that it is fully opened. Do not climb a closed stepladder.  Make  sure that both sides of the stepladder are locked into place.  Ask someone to hold it for you, if possible.  Clearly mark and make sure that you can see:  Any grab bars or handrails.  First and last steps.  Where the edge of each step is.  Use tools that help you move around (mobility aids) if they are needed. These include:  Canes.  Walkers.  Scooters.  Crutches.  Turn on the lights when you go into a dark area. Replace any light bulbs as soon as they burn out.  Set up your furniture so you have a clear path. Avoid moving your furniture around.  If any of your floors are uneven, fix them.  If there are any pets around you, be aware of where they are.  Review your medicines with your doctor. Some medicines can make you feel dizzy. This can increase your chance of falling. Ask your doctor what other things that you can do to help prevent falls. This information is not intended to replace advice given to you by your health care provider. Make sure you discuss any questions you have with your health care provider. Document Released: 01/31/2009 Document Revised: 09/12/2015 Document Reviewed: 05/11/2014 Elsevier Interactive Patient Education  2017 [employer].

## 2016-12-24 NOTE — Progress Notes (Signed)
Subjective:   Joann Baker is a 83 y.o. female who presents for an Initial Medicare Annual Wellness Visit at [facility];incapacitated patient unable to answer questions appropriately        Objective:    Today's Vitals   12/24/16 1522  BP: (!) 142/88  Pulse: 70  Temp: (!) 97.4 F (36.3 C)  TempSrc: Oral  Weight: 93 lb (42.2 kg)  Height:  (1.575 m)   Body mass index is 17.01 kg/m.   Current Medications (verified) Outpatient Encounter Prescriptions as of 12/24/2016  Medication Sig  . acetaminophen (TYLENOL) 325 MG tablet Take 650 mg by mouth every 6 (six) hours as needed for mild pain or moderate pain.  Marland Kitchen aspirin 81 MG chewable tablet Chew 81 mg by mouth daily.  . feeding supplement (BOOST / RESOURCE BREEZE) LIQD Take 1 Container by mouth 3 (three) times daily between meals.   Marland Kitchen HYDROcodone-acetaminophen (NORCO/VICODIN) 5-325 MG tablet Take 0.5 tablets by mouth 3 (three) times daily.  . isosorbide dinitrate (ISORDIL) 10 MG tablet Take 15 mg by mouth 2 (two) times daily.   . Lidocaine (ASPERCREME LIDOCAINE) 4 % PTCH Apply 1 patch topically.  . memantine (NAMENDA) 10 MG tablet Take 10 mg by mouth 2 (two) times daily.  . polyethylene glycol (MIRALAX / GLYCOLAX) packet Take 17 g by mouth daily.  Marland Kitchen selenium sulfide (SELSUN) 1 % LOTN Apply 1 application topically. Shampoo on bath days every Monday and Thursday leave on 10-15 minutes, then rinse.  . sennosides-docusate sodium (SENOKOT-S) 8.6-50 MG tablet Take 1 tablet by mouth daily. Taken at bedtime  . sertraline (ZOLOFT) 50 MG tablet Take 50 mg by mouth daily.   No facility-administered encounter medications on file as of 12/24/2016.     Allergies (verified) Alendronate sodium; Asmanex 120 metered doses [mometasone furoate]; Budesonide-formoterol fumarate; Fluticasone propionate (inhal); Mometasone furoate; Omeprazole; and Valium [diazepam]   History: Past Medical History:  Diagnosis Date  .  Adjustment disorder with depressed mood   . Anemia   . Asthma   . Constipation   . Coronary atherosclerosis of native coronary artery   . Dementia in conditions classified elsewhere without behavioral disturbance   . Depression   . DJD (degenerative joint disease)   . Edema   . GERD (gastroesophageal reflux disease)   . History of fall   . Hyperlipidemia   . Hypertension   . Insomnia   . Osteoporosis   . Pain in joint, pelvic region and thigh    left  . Right bundle branch block   . Unspecified constipation   . Unspecified urinary incontinence   . Urinary tract infection, site not specified    Past Surgical History:  Procedure Laterality Date  . ABDOMINAL HYSTERECTOMY    . CHOLECYSTECTOMY    . FEMUR IM NAIL  06/19/2011   Procedure: INTRAMEDULLARY (IM) NAIL FEMORAL;  Surgeon: Harvie Junior, MD;  Location: [facility] OR;  Service: Orthopedics;  Laterality: Left;  . SKIN CANCER EXCISION  02/15/2013   right forehead Dr. Irene Limbo   History reviewed. No pertinent family history. Social History   Occupational History  . Not on file.   Social History Main Topics  . Smoking status: Never Smoker  . Smokeless tobacco: Never Used  . Alcohol use No  . Drug use: No  . Sexual activity: No    Tobacco Counseling Counseling given: Not Answered   Activities of Daily Living In your present state of health, do you  have any difficulty performing the following activities: 12/24/2016  Hearing? Y  Vision? N  Difficulty concentrating or making decisions? Y  Walking or climbing stairs? Y  Dressing or bathing? Y  Doing errands, shopping? Y  Preparing Food and eating ? Y  Using the Toilet? Y  In the past six months, have you accidently leaked urine? Y  Do you have problems with loss of bowel control? Y  Managing your Medications? Y  Managing your Finances? Y  Housekeeping or managing your Housekeeping? Y  Some recent data might be hidden    Immunizations and Health Maintenance Immunization  History  Administered Date(s) Administered  . Influenza Whole 02/24/2012, 02/23/2013  . Influenza-Unspecified 03/04/2014, 01/08/2015, 02/04/2016  . PPD Test 06/12/2014  . Pneumococcal Polysaccharide-23 04/20/2008  . Td 04/21/2003   Health Maintenance Due  Topic Date Due  . INFLUENZA VACCINE  11/18/2016    Patient Care Team: Oneal GroutPandey, Mahima, MD as PCP - General (Internal Medicine) [place], [facility] Mast, Man X, NP as Nurse Practitioner (Nurse Practitioner)  Indicate any recent Medical Services you may have received from other than [facility] providers in the past year (date may be approximate).     Assessment:   This is a routine wellness examination for [name].   Hearing/Vision screen No exam data present  Dietary issues and exercise activities discussed: Current Exercise Habits: The patient does not participate in regular exercise at present, Exercise limited by: neurologic condition(s)  Goals    None     Depression Screen PHQ 2/9 Scores 12/24/2016 03/11/2015  PHQ - 2 Score - 0  Exception Documentation Medical reason -    Fall Risk Fall Risk  12/24/2016 03/11/2015  Falls in the past year? No No  Risk for fall due to : - Impaired balance/gait    Cognitive Function: MMSE - Mini Mental State Exam 12/24/2016  Not completed: Unable to complete        Screening Tests Health Maintenance  Topic Date Due  . INFLUENZA VACCINE  11/18/2016  . TETANUS/TDAP  04/20/2017 (Originally 04/20/2013)  . PNA vac Low Risk Adult (2 of 2 - PCV13) 04/20/2017 (Originally 04/20/2009)  . DEXA SCAN  Completed      Plan:    I have personally reviewed and addressed the Medicare Annual Wellness questionnaire and have noted the following in the patient's chart:  A. Medical and social history B. Use of alcohol, tobacco or illicit drugs  C. Current medications and supplements D. Functional ability and status E.  Nutritional status F.  Physical activity G. Advance directives H. List of other  physicians I.  Hospitalizations, surgeries, and ER visits in previous 12 months J.  Vitals K. Screenings to include hearing, vision, cognitive, depression L. Referrals and appointments - none  In addition, I am unable to review and discuss with incapacitated patient certain preventive protocols, quality metrics, and best practice recommendations. A written personalized care plan for preventive services as well as general preventive health recommendations were provided to patient.  See attached scanned questionnaire for additional information.   Signed,   Annetta MawSara Gonthier, RN Nurse Health Advisor   Quick Notes   Health Maintenance: [phone] due, not ordered pt is hospice.     Abnormal Screen: Unable to complete MMSE     Patient Concerns: none     Nurse Concerns: none

## 2017-01-08 ENCOUNTER — Non-Acute Institutional Stay (SKILLED_NURSING_FACILITY): Payer: Medicare Other | Admitting: Internal Medicine

## 2017-01-08 ENCOUNTER — Encounter: Payer: Self-pay | Admitting: Internal Medicine

## 2017-01-08 DIAGNOSIS — M4716 Other spondylosis with myelopathy, lumbar region: Secondary | ICD-10-CM | POA: Diagnosis not present

## 2017-01-08 DIAGNOSIS — F32A Depression, unspecified: Secondary | ICD-10-CM

## 2017-01-08 DIAGNOSIS — K5900 Constipation, unspecified: Secondary | ICD-10-CM | POA: Diagnosis not present

## 2017-01-08 DIAGNOSIS — I10 Essential (primary) hypertension: Secondary | ICD-10-CM | POA: Diagnosis not present

## 2017-01-08 DIAGNOSIS — F329 Major depressive disorder, single episode, unspecified: Secondary | ICD-10-CM

## 2017-01-08 NOTE — Progress Notes (Signed)
Location:  [facility] Room Number: 102 Place of Service:  SNF [phone]) Provider:  Oneal Grout MD  Oneal Grout, MD  Patient Care Team: Oneal Grout, MD as PCP - General (Internal Medicine) [place], [facility] Mast, Man X, NP as Nurse Practitioner (Nurse Practitioner)  Extended Emergency Contact Information Primary Emergency Contact: [facility] Address: [address] [place]          [name],  [address] Home Phone: [phone] Relation: None  Code Status:  DNR Goals of care: Advanced Directive information Advanced Directives 01/08/2017  Does Patient Have a Medical Advance Directive? Yes  Type of Estate agent of [place];Living will;Out of facility DNR (pink MOST or yellow form)  Does patient want to make changes to medical advance directive? No - Patient declined  Copy of Healthcare Power of Attorney in Chart? Yes  Pre-existing out of facility DNR order (yellow form or pink MOST form) Yellow form placed in chart (order not valid for inpatient use);Pink MOST form placed in chart (order not valid for inpatient use)     Chief Complaint  Patient presents with  . Medical Management of Chronic Issues    Routine Visit     HPI:  Pt is a 83 y.o. female seen today for medical management of chronic diseases.  She has advanced dementia and does not participate in HPI and ROS. She is under total care. No fall reported. No pressure ulcer or wound reported. She has a supportive daughter who visits her regularly.    Past Medical History:  Diagnosis Date  . Adjustment disorder with depressed mood   . Anemia   . Asthma   . Constipation   . Coronary atherosclerosis of native coronary artery   . Dementia in conditions classified elsewhere without behavioral disturbance   . Depression   . DJD (degenerative joint disease)   . Edema   . GERD (gastroesophageal reflux disease)   . History of fall   . Hyperlipidemia   . Hypertension   .  Insomnia   . Osteoporosis   . Pain in joint, pelvic region and thigh    left  . Right bundle branch block   . Unspecified constipation   . Unspecified urinary incontinence   . Urinary tract infection, site not specified    Past Surgical History:  Procedure Laterality Date  . ABDOMINAL HYSTERECTOMY    . CHOLECYSTECTOMY    . FEMUR IM NAIL  06/19/2011   Procedure: INTRAMEDULLARY (IM) NAIL FEMORAL;  Surgeon: Harvie Junior, MD;  Location: [facility] OR;  Service: Orthopedics;  Laterality: Left;  . SKIN CANCER EXCISION  02/15/2013   right forehead Dr. Irene Limbo    Allergies  Allergen Reactions  . Alendronate Sodium Other (See Comments)    Reaction:  Unknown   . Asmanex 120 Metered Doses [Mometasone Furoate] Other (See Comments)    Reaction:  Unknown   . Budesonide-Formoterol Fumarate Other (See Comments)    Reaction:  Unknown   . Fluticasone Propionate (Inhal) Other (See Comments)    Reaction:  Unknown   . Mometasone Furoate Other (See Comments)    Reaction:  Unknown   . Omeprazole Other (See Comments)    Reaction:  Unknown   . Valium [Diazepam] Other (See Comments)    Reaction:  Unknown     Outpatient Encounter Prescriptions as of 01/08/2017  Medication Sig  . acetaminophen (TYLENOL) 325 MG tablet Take 650 mg by mouth every 6 (six) hours as needed for mild pain or  moderate pain.  Marland Kitchen aspirin 81 MG chewable tablet Chew 81 mg by mouth daily.  . feeding supplement (BOOST / RESOURCE BREEZE) LIQD Take 1 Container by mouth 3 (three) times daily between meals.   Marland Kitchen HYDROcodone-acetaminophen (NORCO/VICODIN) 5-325 MG tablet Take 0.5 tablets by mouth 3 (three) times daily.  . isosorbide dinitrate (ISORDIL) 10 MG tablet Take 15 mg by mouth 2 (two) times daily.   . Lidocaine (ASPERCREME LIDOCAINE) 4 % PTCH Apply 1 patch topically.  . memantine (NAMENDA) 10 MG tablet Take 10 mg by mouth 2 (two) times daily.  . polyethylene glycol (MIRALAX / GLYCOLAX) packet Take 17 g by mouth daily.  Marland Kitchen selenium sulfide  (SELSUN) 1 % LOTN Apply 1 application topically. Shampoo on bath days every Monday and Thursday leave on 10-15 minutes, then rinse.  . sennosides-docusate sodium (SENOKOT-S) 8.6-50 MG tablet Take 1 tablet by mouth at bedtime.   . sertraline (ZOLOFT) 50 MG tablet Take 50 mg by mouth daily.   No facility-administered encounter medications on file as of 01/08/2017.     Review of Systems  Unable to perform ROS: Dementia (from nursing)  Constitutional: Negative for diaphoresis and fever.  HENT: Negative for mouth sores and trouble swallowing.   Respiratory: Negative for cough and shortness of breath.   Cardiovascular: Negative for chest pain.  Gastrointestinal: Negative for abdominal pain, blood in stool, diarrhea and vomiting.  Musculoskeletal: Positive for gait problem.       Transferred with assistance and transported in wheelchair  Neurological: Negative for syncope.  Hematological: Bruises/bleeds easily.  Psychiatric/Behavioral: Positive for confusion.    Immunization History  Administered Date(s) Administered  . Influenza Whole 02/24/2012, 02/23/2013  . Influenza-Unspecified 03/04/2014, 01/08/2015, 02/04/2016  . PPD Test 06/12/2014  . Pneumococcal Polysaccharide-23 04/20/2008  . Td 04/21/2003   Pertinent  Health Maintenance Due  Topic Date Due  . INFLUENZA VACCINE  01/18/2017 (Originally 11/18/2016)  . PNA vac Low Risk Adult (2 of 2 - PCV13) 04/20/2017 (Originally 04/20/2009)  . DEXA SCAN  Completed   Fall Risk  12/24/2016 03/11/2015  Falls in the past year? No No  Risk for fall due to : - Impaired balance/gait   Functional Status Survey:    Vitals:   01/08/17 1301  BP: 120/72  Pulse: 80  Resp: 18  Temp: (!) 97.5 F (36.4 C)  TempSrc: Oral  Weight: 92 lb (41.7 kg)  Height:  (1.575 m)   Body mass index is 16.83 kg/m.   Wt Readings from Last 3 Encounters:  01/08/17 92 lb (41.7 kg)  12/24/16 93 lb (42.2 kg)  11/20/16 93 lb 1.6 oz (42.2 kg)   Physical Exam    Constitutional:  Frail, elderly female patient in no distress  HENT:  Head: Normocephalic and atraumatic.  Mouth/Throat: Oropharynx is clear and moist.  Eyes: Pupils are equal, round, and reactive to light. Conjunctivae and EOM are normal.  Neck: Normal range of motion. Neck supple. No JVD present.  Cardiovascular: Normal rate and regular rhythm.   Pulmonary/Chest: Effort normal and breath sounds normal. No respiratory distress. She has no wheezes. She has no rales.  Kyphosis +  Abdominal: Soft. Bowel sounds are normal. There is no tenderness. There is no guarding.  Musculoskeletal: Normal range of motion. She exhibits no edema.  Can move all 4 extremities, no leg edema, arthritis changes present, ambulates with Mattia and wheelchair and needs 1 person assistance  Lymphadenopathy:    She has no cervical adenopathy.  Neurological: She is alert.  Oriented only to self  Skin: Skin is warm and dry. She is not diaphoretic.  Psychiatric: She has a normal mood and affect.  Dementia present    Labs reviewed:  Recent Labs  03/31/16 09/01/16  NA 145 141  K 4.5 4.2  BUN 30* 26*  CREATININE 1.2* 1.0    Recent Labs  03/31/16 09/01/16  AST 18 21  ALT 9 11  ALKPHOS 69 65    Recent Labs  09/01/16  WBC 8.2  HGB 12.1  HCT 38  PLT 165   Lab Results  Component Value Date   TSH 2.42 09/01/2016   No results found for: HGBA1C Lab Results  Component Value Date   CHOL 131 01/09/2016   HDL 54 01/09/2016   LDLCALC 50 01/09/2016   TRIG 135 01/09/2016    Significant Diagnostic Results in last 30 days:  No results found.  Assessment/Plan  Chronic constipation miralax daily with senokot s qhs and maintaining hydration has been helpful, continue this  HTN Stable BP, continue isodril 15 mg bid and monitor, reviewed bmp. Continue aspirin 81 mg daily  Lumbosacral spondylosis without myelopathy Gets around with assistance on wheelchair and some with her [place]. No fall reported.  Currently on norco 5-325 mg half a tab tid, lidocaine patch and tylenol 650 mg q6h prn pain.she is followed by hospice service. Does not appear to be in pain or distress. monitor  Chronic depression Mood appears stable. Has advanced dementia. Continue redirection and supportive care. Decrease sertraline to 37.5 mg daily for now in attempt for GDR and monitor  Family/ staff Communication: reviewed care plan with patient and charge nurse.    Labs/tests ordered:  none   Oneal Grout, MD Internal Medicine [facility] [address] [place], [place] [address] Cell Phone (Monday-Friday 8 am - 5 pm): [phone] On Call: [phone] and follow prompts after 5 pm and on weekends Office Phone: [phone] Office Fax: [phone]

## 2017-01-25 ENCOUNTER — Other Ambulatory Visit: Payer: Self-pay

## 2017-01-25 MED ORDER — HYDROCODONE-ACETAMINOPHEN 5-325 MG PO TABS: 0.5000 | ORAL_TABLET | Freq: Three times a day (TID) | ORAL | 0 refills | Status: DC

## 2017-02-05 ENCOUNTER — Non-Acute Institutional Stay (SKILLED_NURSING_FACILITY): Payer: Medicare Other | Admitting: Nurse Practitioner

## 2017-02-05 ENCOUNTER — Encounter: Payer: Self-pay | Admitting: Nurse Practitioner

## 2017-02-05 DIAGNOSIS — M150 Primary generalized (osteo)arthritis: Secondary | ICD-10-CM | POA: Diagnosis not present

## 2017-02-05 DIAGNOSIS — F0393 Unspecified dementia, unspecified severity, with mood disturbance: Secondary | ICD-10-CM

## 2017-02-05 DIAGNOSIS — F0280 Dementia in other diseases classified elsewhere without behavioral disturbance: Secondary | ICD-10-CM

## 2017-02-05 DIAGNOSIS — F0390 Unspecified dementia without behavioral disturbance: Secondary | ICD-10-CM

## 2017-02-05 DIAGNOSIS — F329 Major depressive disorder, single episode, unspecified: Secondary | ICD-10-CM | POA: Diagnosis not present

## 2017-02-05 DIAGNOSIS — M159 Polyosteoarthritis, unspecified: Secondary | ICD-10-CM

## 2017-02-05 NOTE — Progress Notes (Signed)
Location:  [facility] Room Number: 102 Place of Service:  SNF (31) Provider:  Malina Geers, Manxie  NP  Oneal Grout, MD  Patient Care Team: Oneal Grout, MD as PCP - General (Internal Medicine) [place] Azura Tufaro X, NP as Nurse Practitioner (Nurse Practitioner)  Extended Emergency Contact Information Primary Emergency Contact: [facility] Address: [address] [place]          [name],  [address] Home Phone: [phone] Relation: None  Code Status:  DNR Goals of care: Advanced Directive information Advanced Directives 02/05/2017  Does Patient Have a Medical Advance Directive? Yes  Type of Estate agent of [place];Living will;Out of facility DNR (pink MOST or yellow form)  Does patient want to make changes to medical advance directive? No - Patient declined  Copy of Healthcare Power of Attorney in Chart? Yes  Pre-existing out of facility DNR order (yellow form or pink MOST form) Yellow form placed in chart (order not valid for inpatient use);Pink MOST form placed in chart (order not valid for inpatient use)     Chief Complaint  Patient presents with  . Medical Management of Chronic Issues    HPI:  Pt is a 83 y.o. female seen today for medical management of chronic diseases.     She has history of dementia, resides in Memory care unit, under hospice service,  taking Memantine for memory, her mood is stable and managed with Sertraline 37.5mg  since 01/08/17. OA pain is managed with Norco 5/325mg  1/2 tid. She is no longer ambulatory, pleasant upon my visit, appears comfortable.    Past Medical History:  Diagnosis Date  . Adjustment disorder with depressed mood   . Anemia   . Asthma   . Constipation   . Coronary atherosclerosis of native coronary artery   . Dementia in conditions classified elsewhere without behavioral disturbance   . Depression   . DJD (degenerative joint disease)   . Edema   . GERD  (gastroesophageal reflux disease)   . History of fall   . Hyperlipidemia   . Hypertension   . Insomnia   . Osteoporosis   . Pain in joint, pelvic region and thigh    left  . Right bundle branch block   . Unspecified constipation   . Unspecified urinary incontinence   . Urinary tract infection, site not specified    Past Surgical History:  Procedure Laterality Date  . ABDOMINAL HYSTERECTOMY    . CHOLECYSTECTOMY    . FEMUR IM NAIL  06/19/2011   Procedure: INTRAMEDULLARY (IM) NAIL FEMORAL;  Surgeon: Harvie Junior, MD;  Location: [facility] OR;  Service: Orthopedics;  Laterality: Left;  . SKIN CANCER EXCISION  02/15/2013   right forehead Dr. Irene Limbo    Allergies  Allergen Reactions  . Alendronate Sodium Other (See Comments)    Reaction:  Unknown   . Asmanex 120 Metered Doses [Mometasone Furoate] Other (See Comments)    Reaction:  Unknown   . Budesonide-Formoterol Fumarate Other (See Comments)    Reaction:  Unknown   . Fluticasone Propionate (Inhal) Other (See Comments)    Reaction:  Unknown   . Mometasone Furoate Other (See Comments)    Reaction:  Unknown   . Omeprazole Other (See Comments)    Reaction:  Unknown   . Valium [Diazepam] Other (See Comments)    Reaction:  Unknown     Outpatient Encounter Prescriptions as of 02/05/2017  Medication Sig  . acetaminophen (TYLENOL) 325 MG tablet Take 650 mg  by mouth every 6 (six) hours as needed for mild pain or moderate pain.  Marland Kitchen aspirin 81 MG chewable tablet Chew 81 mg by mouth daily.  . feeding supplement (BOOST / RESOURCE BREEZE) LIQD Take 1 Container by mouth 3 (three) times daily between meals.   Marland Kitchen HYDROcodone-acetaminophen (NORCO/VICODIN) 5-325 MG tablet Take 0.5 tablets by mouth 3 (three) times daily.  . isosorbide dinitrate (ISORDIL) 10 MG tablet Take 15 mg by mouth 2 (two) times daily.   . Lidocaine (ASPERCREME LIDOCAINE) 4 % PTCH Apply 1 patch topically.  . memantine (NAMENDA) 10 MG tablet Take 10 mg by mouth 2 (two) times daily.   . polyethylene glycol (MIRALAX / GLYCOLAX) packet Take 17 g by mouth daily.  Marland Kitchen selenium sulfide (SELSUN) 1 % LOTN Apply 1 application topically. Shampoo on bath days every Monday and Thursday leave on 10-15 minutes, then rinse.  . sennosides-docusate sodium (SENOKOT-S) 8.6-50 MG tablet Take 1 tablet by mouth at bedtime.   . sertraline (ZOLOFT) 50 MG tablet Take 50 mg by mouth daily.   No facility-administered encounter medications on file as of 02/05/2017.     Review of Systems  Constitutional: Negative for activity change, appetite change, chills, diaphoresis, fatigue and fever.       Thin, frail  HENT: Negative for congestion.   Eyes: Negative for visual disturbance.  Respiratory: Negative for cough, choking, chest tightness and shortness of breath.   Cardiovascular: Negative for chest pain, palpitations and leg swelling.  Gastrointestinal: Negative for abdominal distention and abdominal pain.  Genitourinary: Negative for dysuria and hematuria.       Incontinent of bladder.   Musculoskeletal: Positive for arthralgias and gait problem.  Skin: Negative for color change, pallor, rash and wound.  Neurological: Negative for tremors, speech difficulty and headaches.  Psychiatric/Behavioral: Positive for confusion. Negative for agitation, behavioral problems, hallucinations and sleep disturbance.    Immunization History  Administered Date(s) Administered  . Influenza Whole 02/24/2012, 02/23/2013  . Influenza-Unspecified 03/04/2014, 01/08/2015, 02/04/2016  . PPD Test 06/12/2014  . Pneumococcal Polysaccharide-23 04/20/2008  . Td 04/21/2003   Pertinent  Health Maintenance Due  Topic Date Due  . INFLUENZA VACCINE  11/18/2016  . PNA vac Low Risk Adult (2 of 2 - PCV13) 04/20/2017 (Originally 04/20/2009)  . DEXA SCAN  Completed   Fall Risk  12/24/2016 03/11/2015  Falls in the past year? No No  Risk for fall due to : - Impaired balance/gait   Functional Status Survey:    Vitals:    02/05/17 1322  BP: 140/80  Pulse: 66  Resp: 20  Temp: 97.7 F (36.5 C)  Weight: 96 lb 3.2 oz (43.6 kg)  Height: 5\' 2"  (1.575 m)   Body mass index is 17.6 kg/m. Physical Exam  Constitutional: She appears well-developed and well-nourished.  HENT:  Head: Normocephalic and atraumatic.  Eyes: Pupils are equal, round, and reactive to light. Conjunctivae and EOM are normal.  Neck: Normal range of motion. Neck supple. No JVD present.  Cardiovascular: Normal rate, regular rhythm and normal heart sounds.   No murmur heard. Pulmonary/Chest: Effort normal and breath sounds normal. She has no wheezes. She has no rales.  Abdominal: Soft. Bowel sounds are normal. She exhibits no distension. There is no tenderness.  Musculoskeletal: She exhibits no edema or tenderness.  Neurological: She is alert. She exhibits normal muscle tone. Coordination normal.  Oriented to self.   Skin: Skin is warm and dry. No rash noted. No erythema. No pallor.  Psychiatric: She has  a normal mood and affect.    Labs reviewed:  Recent Labs  03/31/16 09/01/16  NA 145 141  K 4.5 4.2  BUN 30* 26*  CREATININE 1.2* 1.0    Recent Labs  03/31/16 09/01/16  AST 18 21  ALT 9 11  ALKPHOS 69 65    Recent Labs  09/01/16  WBC 8.2  HGB 12.1  HCT 38  PLT 165   Lab Results  Component Value Date   TSH 2.42 09/01/2016   No results found for: HGBA1C Lab Results  Component Value Date   CHOL 131 01/09/2016   HDL 54 01/09/2016   LDLCALC 50 01/09/2016   TRIG 135 01/09/2016    Significant Diagnostic Results in last 30 days:  No results found.  Assessment/Plan Depression due to dementia Mood is stable, continue Sertraline 37.5mg  po qd, observe.   DJD (degenerative joint disease) Multiple sites, no longer ambulatory, continue Norco 5/325mg  1/2 tid, her goal of care is comfort measures.   Dementia Pleasant, oriented to self, total care of ADL except feeding self sometimes. Continue Memantine, Memory Care unit,  Hospice service.      Family/ staff Communication: plan of care reviewed with the patient and charge nurse.   Labs/tests ordered:  None  Time spend 25 minutes.

## 2017-02-05 NOTE — Assessment & Plan Note (Signed)
Mood is stable, continue Sertraline 37.5mg  po qd, observe.

## 2017-02-05 NOTE — Assessment & Plan Note (Signed)
Multiple sites, no longer ambulatory, continue Norco 5/325mg  1/2 tid, her goal of care is comfort measures.

## 2017-02-05 NOTE — Assessment & Plan Note (Signed)
Pleasant, oriented to self, total care of ADL except feeding self sometimes. Continue Memantine, Memory Care unit, Hospice service.

## 2017-03-19 ENCOUNTER — Non-Acute Institutional Stay (SKILLED_NURSING_FACILITY): Payer: Medicare Other | Admitting: Internal Medicine

## 2017-03-19 ENCOUNTER — Encounter: Payer: Self-pay | Admitting: Internal Medicine

## 2017-03-19 DIAGNOSIS — I200 Unstable angina: Secondary | ICD-10-CM

## 2017-03-19 DIAGNOSIS — F329 Major depressive disorder, single episode, unspecified: Secondary | ICD-10-CM

## 2017-03-19 DIAGNOSIS — M159 Polyosteoarthritis, unspecified: Secondary | ICD-10-CM

## 2017-03-19 DIAGNOSIS — F0393 Unspecified dementia, unspecified severity, with mood disturbance: Secondary | ICD-10-CM

## 2017-03-19 DIAGNOSIS — M150 Primary generalized (osteo)arthritis: Secondary | ICD-10-CM | POA: Diagnosis not present

## 2017-03-19 DIAGNOSIS — I25700 Atherosclerosis of coronary artery bypass graft(s), unspecified, with unstable angina pectoris: Secondary | ICD-10-CM

## 2017-03-19 DIAGNOSIS — F0280 Dementia in other diseases classified elsewhere without behavioral disturbance: Secondary | ICD-10-CM | POA: Diagnosis not present

## 2017-03-19 DIAGNOSIS — E43 Unspecified severe protein-calorie malnutrition: Secondary | ICD-10-CM

## 2017-03-19 DIAGNOSIS — F0390 Unspecified dementia without behavioral disturbance: Secondary | ICD-10-CM | POA: Diagnosis not present

## 2017-03-19 NOTE — Progress Notes (Signed)
Location:  [facility] Room Number: 102 Place of Service:  SNF [phone](31) Provider:  Oneal GroutMahima Toshua Honsinger MD  Oneal GroutPandey, Ashelyn Mccravy, MD  Patient Care Team: Oneal GroutPandey, Zaydon Kinser, MD as PCP - General (Internal Medicine) [place] Mast, Man X, NP as Nurse Practitioner (Nurse Practitioner)  Extended Emergency Contact Information Primary Emergency Contact: [facility]TERESA Address: [address]          [place],  [id number] Home Phone: [phone] Relation: None  Code Status:  DNR  Goals of care: Advanced Directive information Advanced Directives 02/05/2017  Does Patient Have a Medical Advance Directive? Yes  Type of Estate agentAdvance Directive Healthcare Power of TennantAttorney;Living will;Out of facility DNR (pink MOST or yellow form)  Does patient want to make changes to medical advance directive? No - Patient declined  Copy of Healthcare Power of Attorney in Chart? Yes  Pre-existing out of facility DNR order (yellow form or pink MOST form) Yellow form placed in chart (order not valid for inpatient use);Pink MOST form placed in chart (order not valid for inpatient use)     Chief Complaint  Patient presents with  . Medical Management of Chronic Issues    HPI:  Pt is a 83 y.o. female seen today for medical management of chronic diseases.  She does not participate in HPI and ROS this visit. Per nursing, she participates some in conversation when she wants. She has advanced dementia. She feeds herself at times and needs assistance other times. She uses wheelchair to ambulate and has to be wheeled around. She is incontinent with her bowel and bladder. Able to ambulate short distance with [place] with assistance. No fall reported. Takes her medications. No rash or wound reported. She needs frequent redirection.    Past Medical History:  Diagnosis Date  . Adjustment disorder with depressed mood   . Anemia   . Asthma   . Constipation   . Coronary atherosclerosis of native coronary  artery   . Dementia in conditions classified elsewhere without behavioral disturbance   . Depression   . DJD (degenerative joint disease)   . Edema   . GERD (gastroesophageal reflux disease)   . History of fall   . Hyperlipidemia   . Hypertension   . Insomnia   . Osteoporosis   . Pain in joint, pelvic region and thigh    left  . Right bundle branch block   . Unspecified constipation   . Unspecified urinary incontinence   . Urinary tract infection, site not specified    Past Surgical History:  Procedure Laterality Date  . ABDOMINAL HYSTERECTOMY    . CHOLECYSTECTOMY    . FEMUR IM NAIL  06/19/2011   Procedure: INTRAMEDULLARY (IM) NAIL FEMORAL;  Surgeon: Harvie JuniorJohn L Graves, MD;  Location: [facility] OR;  Service: Orthopedics;  Laterality: Left;  . SKIN CANCER EXCISION  02/15/2013   right forehead Dr. Irene LimboGoodrich    Allergies  Allergen Reactions  . Alendronate Sodium Other (See Comments)    Reaction:  Unknown   . Asmanex 120 Metered Doses [Mometasone Furoate] Other (See Comments)    Reaction:  Unknown   . Budesonide-Formoterol Fumarate Other (See Comments)    Reaction:  Unknown   . Fluticasone Propionate (Inhal) Other (See Comments)    Reaction:  Unknown   . Mometasone Furoate Other (See Comments)    Reaction:  Unknown   . Omeprazole Other (See Comments)    Reaction:  Unknown   . Valium [Diazepam] Other (See Comments)    Reaction:  Unknown     Outpatient Encounter Medications as of 03/19/2017  Medication Sig  . acetaminophen (TYLENOL) 325 MG tablet Take 650 mg by mouth every 6 (six) hours as needed for mild pain or moderate pain.  Marland Kitchen. aspirin 81 MG chewable tablet Chew 81 mg by mouth daily.  . feeding supplement (BOOST / RESOURCE BREEZE) LIQD Take 1 Container by mouth 3 (three) times daily between meals.   Marland Kitchen. HYDROcodone-acetaminophen (NORCO/VICODIN) 5-325 MG tablet Take 0.5 tablets by mouth 3 (three) times daily.  . isosorbide dinitrate (ISORDIL) 10 MG tablet Take 15 mg by mouth 2 (two)  times daily.   . Lidocaine (ASPERCREME LIDOCAINE) 4 % PTCH Apply 1 patch topically.  . memantine (NAMENDA) 10 MG tablet Take 10 mg by mouth 2 (two) times daily.  . polyethylene glycol (MIRALAX / GLYCOLAX) packet Take 17 g by mouth daily.  Marland Kitchen. selenium sulfide (SELSUN) 1 % LOTN Apply 1 application topically. Shampoo on bath days every Monday and Thursday leave on 10-15 minutes, then rinse.  . sennosides-docusate sodium (SENOKOT-S) 8.6-50 MG tablet Take 1 tablet by mouth at bedtime.   . sertraline (ZOLOFT) 50 MG tablet Take 50 mg by mouth daily.   No facility-administered encounter medications on file as of 03/19/2017.     Review of Systems  Unable to perform ROS: Dementia    Immunization History  Administered Date(s) Administered  . Influenza Whole 02/24/2012, 02/23/2013  . Influenza-Unspecified 03/04/2014, 01/08/2015, 02/04/2016, 02/09/2017  . PPD Test 06/12/2014  . Pneumococcal Polysaccharide-23 04/20/2008  . Td 04/21/2003   Pertinent  Health Maintenance Due  Topic Date Due  . PNA vac Low Risk Adult (2 of 2 - PCV13) 04/20/2017 (Originally 04/20/2009)  . INFLUENZA VACCINE  Completed  . DEXA SCAN  Completed   Fall Risk  12/24/2016 03/11/2015  Falls in the past year? No No  Risk for fall due to : - Impaired balance/gait   Functional Status Survey:    Vitals:   03/19/17 1128  BP: 130/80  Pulse: 62  Resp: 18  Temp: 98.3 F (36.8 C)  Weight: 95 lb (43.1 kg)  Height: 5\' 2"  (1.575 m)   Body mass index is 17.38 kg/m.   Wt Readings from Last 3 Encounters:  03/19/17 95 lb (43.1 kg)  02/05/17 96 lb 3.2 oz (43.6 kg)  01/08/17 92 lb (41.7 kg)   Physical Exam  Constitutional:  Thin built, frail, in no acute distress.  HENT:  Head: Normocephalic and atraumatic.  Mouth/Throat: Oropharynx is clear and moist. No oropharyngeal exudate.  Eyes: Conjunctivae and EOM are normal. Pupils are equal, round, and reactive to light. Right eye exhibits no discharge. Left eye exhibits no  discharge.  Neck: Neck supple.  Cardiovascular: Normal rate and regular rhythm.  Pulmonary/Chest: Effort normal and breath sounds normal. She has no wheezes. She has no rales.  Abdominal: Soft. Bowel sounds are normal. There is no tenderness. There is no guarding.  Musculoskeletal: She exhibits deformity. She exhibits no edema.  Unsteady gait, needs assistance with transfers, wheelchair mainly for ambulation, arthritis changes to her digits, kyphosis present  Lymphadenopathy:    She has no cervical adenopathy.  Neurological: She is alert.  Oriented only to self  Skin: Skin is warm and dry.  Psychiatric: She has a normal mood and affect.    Labs reviewed: Recent Labs    03/31/16 09/01/16  NA 145 141  K 4.5 4.2  BUN 30* 26*  CREATININE 1.2* 1.0   Recent Labs  03/31/16 09/01/16  AST 18 21  ALT 9 11  ALKPHOS 69 65   Recent Labs    09/01/16  WBC 8.2  HGB 12.1  HCT 38  PLT 165   Lab Results  Component Value Date   TSH 2.42 09/01/2016   No results found for: HGBA1C Lab Results  Component Value Date   CHOL 131 01/09/2016   HDL 54 01/09/2016   LDLCALC 50 01/09/2016   TRIG 135 01/09/2016    Significant Diagnostic Results in last 30 days:  No results found.  Assessment/Plan  Chronic depression With dementia. Stable, continue sertraline 50 mg daily for now and monitor her mood.   Dementia without behavioral disturbance Continue memantine for now, supportive care  Severe protein calorie malnutrition With advanced dementia, decline anticipated, weight low but stable. Continue nutritional supplement and supportive care.   DJD With lumbosacral spondylosis, OA. Does not appear in any distress. Currently on lidocaine patch and norco 5-325 mg 1/2 tab tid with prn tylenol. Given her advanced age and fraility, decrease norco to 1/2 tab bid and monitor.   CAD Chest pain free. BP reviewed with few DBP in 50s. SBP at goal. Currently on isosorbide dinitrate 15 mg bid.  Change this to 10 mg bid and monitor. Check cbc and cmp    Family/ staff Communication: reviewed care plan with patient and charge nurse.    Labs/tests ordered:     Oneal Grout, MD Internal Medicine [facility] [address] [place], [place] [address] Cell Phone (Monday-Friday 8 am - 5 pm): [phone] On Call: [phone] and follow prompts after 5 pm and on weekends Office Phone: [phone] Office Fax: [phone]

## 2017-03-23 LAB — BASIC METABOLIC PANEL
BUN: 27 — AB (ref 4–21)
Creatinine: 0.8 (ref 0.5–1.1)
GLUCOSE: 87
POTASSIUM: 4.1 (ref 3.4–5.3)
SODIUM: 142 (ref 137–147)

## 2017-03-23 LAB — HEPATIC FUNCTION PANEL
ALT: 14 (ref 7–35)
AST: 22 (ref 13–35)
Alkaline Phosphatase: 85 (ref 25–125)
Bilirubin, Total: 0.5

## 2017-03-23 LAB — CBC AND DIFFERENTIAL
HCT: 33 — AB (ref 36–46)
Hemoglobin: 11.4 — AB (ref 12.0–16.0)
Platelets: 206 (ref 150–399)
WBC: 7.5

## 2017-04-14 ENCOUNTER — Non-Acute Institutional Stay (SKILLED_NURSING_FACILITY): Payer: Medicare Other | Admitting: Nurse Practitioner

## 2017-04-14 ENCOUNTER — Encounter: Payer: Self-pay | Admitting: Nurse Practitioner

## 2017-04-14 DIAGNOSIS — I200 Unstable angina: Secondary | ICD-10-CM

## 2017-04-14 DIAGNOSIS — G4700 Insomnia, unspecified: Secondary | ICD-10-CM

## 2017-04-14 DIAGNOSIS — M4716 Other spondylosis with myelopathy, lumbar region: Secondary | ICD-10-CM

## 2017-04-14 DIAGNOSIS — F0280 Dementia in other diseases classified elsewhere without behavioral disturbance: Secondary | ICD-10-CM | POA: Diagnosis not present

## 2017-04-14 DIAGNOSIS — F329 Major depressive disorder, single episode, unspecified: Secondary | ICD-10-CM | POA: Diagnosis not present

## 2017-04-14 DIAGNOSIS — I25700 Atherosclerosis of coronary artery bypass graft(s), unspecified, with unstable angina pectoris: Secondary | ICD-10-CM | POA: Diagnosis not present

## 2017-04-14 DIAGNOSIS — F0393 Unspecified dementia, unspecified severity, with mood disturbance: Secondary | ICD-10-CM

## 2017-04-14 DIAGNOSIS — K5900 Constipation, unspecified: Secondary | ICD-10-CM | POA: Diagnosis not present

## 2017-04-14 NOTE — Assessment & Plan Note (Signed)
dementia, [facility], one person transfer and ambulate with [place]/gait belt, incontinent B+B.

## 2017-04-14 NOTE — Assessment & Plan Note (Signed)
no constipation, continue Senokot S I qd, Miralax qd

## 2017-04-14 NOTE — Progress Notes (Signed)
Location:  [facility] Room Number: 100 Place of Service:  SNF (31) Provider:  Jobeth Pangilinan, Manxie  NP  Oneal GroutPandey, Mahima, MD  Patient Care Team: Oneal GroutPandey, Mahima, MD as PCP - General (Internal Medicine) [facility] Romin Divita X, NP as Nurse Practitioner (Nurse Practitioner)  Extended Emergency Contact Information Primary Emergency Contact: [facility] Address: [address]          [place]  [id number] Home Phone: [phone] Relation: None  Code Status:  DNR Goals of care: Advanced Directive information Advanced Directives 04/14/2017  Does Patient Have a Medical Advance Directive? Yes  Type of Advance Directive Living will;Out of facility DNR (pink MOST or yellow form)  Does patient want to make changes to medical advance directive? No - Patient declined  Copy of Healthcare Power of Attorney in Chart? -  Pre-existing out of facility DNR order (yellow form or pink MOST form) Yellow form placed in chart (order not valid for inpatient use);Pink MOST form placed in chart (order not valid for inpatient use)     Chief Complaint  Patient presents with  . Medical Management of Chronic Issues    HPI:  Pt is a 83 y.o. female seen today for medical management of chronic diseases.     Hx of dementia, [facility], under hospice service, one person transfer and ambulate with [place]/gait belt, incontinent B+B.  Multiple OA pain, managed with Norco 5/325mg  1/2 bid since 03/19/17. CAD Isosorbide was reduced to 10mg  bid since 03/19/17. Her mood is stable, on Sertraline 50mg  qd, no constipation while on Senokot S I qd, Miralax qd  Past Medical History:  Diagnosis Date  . Adjustment disorder with depressed mood   . Anemia   . Asthma   . Constipation   . Coronary atherosclerosis of native coronary artery   . Dementia in conditions classified elsewhere without behavioral disturbance   . Depression   . DJD (degenerative joint disease)   .  Edema   . GERD (gastroesophageal reflux disease)   . History of fall   . Hyperlipidemia   . Hypertension   . Insomnia   . Osteoporosis   . Pain in joint, pelvic region and thigh    left  . Right bundle branch block   . Unspecified constipation   . Unspecified urinary incontinence   . Urinary tract infection, site not specified    Past Surgical History:  Procedure Laterality Date  . ABDOMINAL HYSTERECTOMY    . CHOLECYSTECTOMY    . FEMUR IM NAIL  06/19/2011   Procedure: INTRAMEDULLARY (IM) NAIL FEMORAL;  Surgeon: Harvie JuniorJohn L Graves, MD;  Location: [facility] OR;  Service: Orthopedics;  Laterality: Left;  . SKIN CANCER EXCISION  02/15/2013   right forehead Dr. Irene LimboGoodrich    Allergies  Allergen Reactions  . Alendronate Sodium Other (See Comments)    Reaction:  Unknown   . Asmanex 120 Metered Doses [Mometasone Furoate] Other (See Comments)    Reaction:  Unknown   . Budesonide-Formoterol Fumarate Other (See Comments)    Reaction:  Unknown   . Fluticasone Propionate (Inhal) Other (See Comments)    Reaction:  Unknown   . Mometasone Furoate Other (See Comments)    Reaction:  Unknown   . Omeprazole Other (See Comments)    Reaction:  Unknown   . Valium [Diazepam] Other (See Comments)    Reaction:  Unknown     Outpatient Encounter Medications as of 04/14/2017  Medication Sig  .  acetaminophen (TYLENOL) 325 MG tablet Take 650 mg by mouth every 6 (six) hours as needed for mild pain or moderate pain.  Marland Kitchen aspirin 81 MG chewable tablet Chew 81 mg by mouth daily.  . feeding supplement (BOOST / RESOURCE BREEZE) LIQD Take 1 Container by mouth 3 (three) times daily between meals.   Marland Kitchen HYDROcodone-acetaminophen (NORCO/VICODIN) 5-325 MG tablet Take 0.5 tablets by mouth 3 (three) times daily.  . isosorbide dinitrate (ISORDIL) 10 MG tablet Take 15 mg by mouth 2 (two) times daily.   . Lidocaine (ASPERCREME LIDOCAINE) 4 % PTCH Apply 1 patch topically.  . memantine (NAMENDA) 10 MG tablet Take 10 mg by mouth 2  (two) times daily.  . polyethylene glycol (MIRALAX / GLYCOLAX) packet Take 17 g by mouth daily.  Marland Kitchen selenium sulfide (SELSUN) 1 % LOTN Apply 1 application topically. Shampoo on bath days every Monday and Thursday leave on 10-15 minutes, then rinse.  . sennosides-docusate sodium (SENOKOT-S) 8.6-50 MG tablet Take 1 tablet by mouth at bedtime.   . sertraline (ZOLOFT) 50 MG tablet Take 50 mg by mouth daily.   No facility-administered encounter medications on file as of 04/14/2017.    ROS was provided with assistance of staff Review of Systems  Constitutional: Negative for activity change, appetite change, chills, diaphoresis, fatigue and fever.  HENT: Positive for hearing loss. Negative for congestion, trouble swallowing and voice change.   Eyes: Negative for visual disturbance.  Respiratory: Negative for cough, choking, chest tightness, shortness of breath and wheezing.   Cardiovascular: Negative for chest pain, palpitations and leg swelling.  Gastrointestinal: Negative for abdominal distention, abdominal pain, constipation, diarrhea, nausea and vomiting.       Incontinent bowel.   Endocrine: Negative for cold intolerance.  Genitourinary: Negative for difficulty urinating, dysuria and urgency.       Incontinent of urine  Musculoskeletal: Positive for arthralgias, back pain and gait problem.  Skin: Negative for color change, pallor, rash and wound.  Neurological: Negative for tremors, speech difficulty, weakness and headaches.       Dementia  Psychiatric/Behavioral: Positive for confusion. Negative for agitation, behavioral problems, hallucinations and sleep disturbance.    Immunization History  Administered Date(s) Administered  . Influenza Whole 02/24/2012, 02/23/2013  . Influenza-Unspecified 03/04/2014, 01/08/2015, 02/04/2016, 02/09/2017  . PPD Test 06/12/2014  . Pneumococcal Polysaccharide-23 04/20/2008  . Td 04/21/2003   Pertinent  Health Maintenance Due  Topic Date Due  . PNA  vac Low Risk Adult (2 of 2 - PCV13) 04/20/2017 (Originally 04/20/2009)  . INFLUENZA VACCINE  Completed  . DEXA SCAN  Completed   Fall Risk  12/24/2016 03/11/2015  Falls in the past year? No No  Risk for fall due to : - Impaired balance/gait   Functional Status Survey:    Vitals:   04/14/17 1408  BP: 140/70  Pulse: 62  Resp: 18  Temp: (!) 97 F (36.1 C)  Weight: 98 lb 9.6 oz (44.7 kg)  Height: 5\' 2"  (1.575 m)   Body mass index is 18.03 kg/m. Physical Exam  Constitutional: She appears well-developed and well-nourished.  HENT:  Head: Normocephalic and atraumatic.  Eyes: Conjunctivae and EOM are normal. Pupils are equal, round, and reactive to light.  Neck: Normal range of motion. Neck supple. No JVD present. No thyromegaly present.  Cardiovascular: Normal rate, regular rhythm and normal heart sounds.  No murmur heard. Pulmonary/Chest: Effort normal and breath sounds normal. She has no wheezes. She has no rales.  Abdominal: Soft. Bowel sounds are normal. She  exhibits no distension. There is no tenderness. There is no rebound.  Musculoskeletal: Normal range of motion. She exhibits no edema or tenderness.  One person transfer, one person assist with ambulation with gait belt.  Neurological: She is alert. She exhibits normal muscle tone. Coordination normal.  Oriented to self.   Skin: Skin is warm and dry. No rash noted. No erythema.  Psychiatric: She has a normal mood and affect. Her behavior is normal.    Labs reviewed: Recent Labs    09/01/16  NA 141  K 4.2  BUN 26*  CREATININE 1.0   Recent Labs    09/01/16  AST 21  ALT 11  ALKPHOS 65   Recent Labs    09/01/16  WBC 8.2  HGB 12.1  HCT 38  PLT 165   Lab Results  Component Value Date   TSH 2.42 09/01/2016   No results found for: HGBA1C Lab Results  Component Value Date   CHOL 131 01/09/2016   HDL 54 01/09/2016   LDLCALC 50 01/09/2016   TRIG 135 01/09/2016    Significant Diagnostic Results in last 30  days:  No results found.  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    Lumbosacral spondylosis with myelopathy Multiple OA pain, managed, continue Norco 5/325mg  1/2 bid since 03/19/17.   CAD (coronary artery disease) of artery bypass graft CAD Isosorbide was reduced to 10mg  bid since 03/19/17. No occurrence of angina.   Insomnia Her mood is stable, sleeps well most of nights, continue  Sertraline 50mg  qd  Constipation no constipation, continue Senokot S I qd, Miralax qd     Family/ staff Communication: plan of care reviewed with the patient and charge nurse.   Labs/tests ordered:  none  Time spend 25 minutes.

## 2017-04-14 NOTE — Assessment & Plan Note (Signed)
Her mood is stable, sleeps well most of nights, continue  Sertraline 50mg  qd

## 2017-04-14 NOTE — Assessment & Plan Note (Signed)
CAD Isosorbide was reduced to 10mg  bid since 03/19/17. No occurrence of angina.

## 2017-04-14 NOTE — Assessment & Plan Note (Signed)
Multiple OA pain, managed, continue Norco 5/325mg  1/2 bid since 03/19/17.

## 2017-05-13 ENCOUNTER — Encounter: Payer: Self-pay | Admitting: Internal Medicine

## 2017-05-13 ENCOUNTER — Non-Acute Institutional Stay (SKILLED_NURSING_FACILITY): Payer: Medicare Other | Admitting: Internal Medicine

## 2017-05-13 DIAGNOSIS — I119 Hypertensive heart disease without heart failure: Secondary | ICD-10-CM

## 2017-05-13 DIAGNOSIS — K5901 Slow transit constipation: Secondary | ICD-10-CM | POA: Diagnosis not present

## 2017-05-13 DIAGNOSIS — D638 Anemia in other chronic diseases classified elsewhere: Secondary | ICD-10-CM | POA: Diagnosis not present

## 2017-05-13 DIAGNOSIS — M47817 Spondylosis without myelopathy or radiculopathy, lumbosacral region: Secondary | ICD-10-CM | POA: Diagnosis not present

## 2017-05-13 DIAGNOSIS — F0393 Unspecified dementia, unspecified severity, with mood disturbance: Secondary | ICD-10-CM

## 2017-05-13 DIAGNOSIS — F0280 Dementia in other diseases classified elsewhere without behavioral disturbance: Secondary | ICD-10-CM | POA: Diagnosis not present

## 2017-05-13 DIAGNOSIS — F329 Major depressive disorder, single episode, unspecified: Secondary | ICD-10-CM

## 2017-05-13 NOTE — Progress Notes (Signed)
Location[facility]  [facility] Room Number: 40-A Place of Service:  SNF (31) Provider:  Oneal Grout MD  Oneal Grout, MD  Patient Care Team: Oneal Grout, MD as PCP - General (Internal Medicine) [facility] Mast, Man X, NP as Nurse Practitioner (Nurse Practitioner)  Extended Emergency Contact Information Primary Emergency Contact: [facility] Address: [address]          [name],  [address] Home Phone: [phone] Relation: None  Code Status:  DNR  Goals of care: Advanced Directive information Advanced Directives 05/13/2017  Does Patient Have a Medical Advance Directive? Yes  Type of Advance Directive Living will;Out of facility DNR (pink MOST or yellow form)  Does patient want to make changes to medical advance directive? No - Patient declined  Copy of Healthcare Power of Attorney in Chart? -  Pre-existing out of facility DNR order (yellow form or pink MOST form) Yellow form placed in chart (order not valid for inpatient use);Pink MOST form placed in chart (order not valid for inpatient use)     Chief Complaint  Patient presents with  . Medical Management of Chronic Issues    Routine Visit     HPI:  Pt is a 84 y.o. female seen today for medical management of chronic diseases. She has advanced dementia and does not participate in HPI and ROS. She is seen in her room today. She is under total care. No acute concern from nursing. She is incontinent with bowel and bladder. She is under hospice services.    Past Medical History:  Diagnosis Date  . Adjustment disorder with depressed mood   . Anemia   . Asthma   . Constipation   . Coronary atherosclerosis of native coronary artery   . Dementia in conditions classified elsewhere without behavioral disturbance   . Depression   . DJD (degenerative joint disease)   . Edema   . GERD (gastroesophageal reflux disease)   . History of fall   . Hyperlipidemia   . Hypertension   .  Insomnia   . Osteoporosis   . Pain in joint, pelvic region and thigh    left  . Right bundle branch block   . Unspecified constipation   . Unspecified urinary incontinence   . Urinary tract infection, site not specified    Past Surgical History:  Procedure Laterality Date  . ABDOMINAL HYSTERECTOMY    . CHOLECYSTECTOMY    . FEMUR IM NAIL  06/19/2011   Procedure: INTRAMEDULLARY (IM) NAIL FEMORAL;  Surgeon: Harvie Junior, MD;  Location: [facility] OR;  Service: Orthopedics;  Laterality: Left;  . SKIN CANCER EXCISION  02/15/2013   right forehead Dr. Irene Limbo    Allergies  Allergen Reactions  . Alendronate Sodium Other (See Comments)    Reaction:  Unknown   . Asmanex 120 Metered Doses [Mometasone Furoate] Other (See Comments)    Reaction:  Unknown   . Budesonide-Formoterol Fumarate Other (See Comments)    Reaction:  Unknown   . Fluticasone Propionate (Inhal) Other (See Comments)    Reaction:  Unknown   . Mometasone Furoate Other (See Comments)    Reaction:  Unknown   . Omeprazole Other (See Comments)    Reaction:  Unknown   . Valium [Diazepam] Other (See Comments)    Reaction:  Unknown     Outpatient Encounter Medications as of 05/13/2017  Medication Sig  . acetaminophen (TYLENOL) 325 MG tablet Take 650 mg by mouth every 6 (six) hours as needed for  mild pain or moderate pain.  Marland Kitchen. aspirin 81 MG chewable tablet Chew 81 mg by mouth daily.  . feeding supplement (BOOST / RESOURCE BREEZE) LIQD Take 1 Container by mouth 3 (three) times daily between meals.   Marland Kitchen. HYDROcodone-acetaminophen (NORCO/VICODIN) 5-325 MG tablet Take 1 tablet by mouth 2 (two) times daily.  . isosorbide dinitrate (ISORDIL) 10 MG tablet Take 10 mg by mouth 2 (two) times daily.   . Lidocaine (ASPERCREME LIDOCAINE) 4 % PTCH Apply 1 patch topically.  . memantine (NAMENDA) 10 MG tablet Take 10 mg by mouth 2 (two) times daily.  . polyethylene glycol (MIRALAX / GLYCOLAX) packet Take 17 g by mouth daily.  Marland Kitchen. selenium sulfide  (SELSUN) 1 % LOTN Apply 1 application topically. Shampoo on bath days every Monday and Thursday leave on 10-15 minutes, then rinse.  . sennosides-docusate sodium (SENOKOT-S) 8.6-50 MG tablet Take 1 tablet by mouth at bedtime.   . sertraline (ZOLOFT) 50 MG tablet Take 37.5 mg by mouth daily.   . [DISCONTINUED] HYDROcodone-acetaminophen (NORCO/VICODIN) 5-325 MG tablet Take 0.5 tablets by mouth 3 (three) times daily.   No facility-administered encounter medications on file as of 05/13/2017.     Review of Systems  Unable to perform ROS: Dementia    Immunization History  Administered Date(s) Administered  . Influenza Whole 02/24/2012, 02/23/2013  . Influenza-Unspecified 03/04/2014, 01/08/2015, 02/04/2016, 02/09/2017  . PPD Test 06/12/2014  . Pneumococcal Polysaccharide-23 04/20/2008  . Td 04/21/2003   Pertinent  Health Maintenance Due  Topic Date Due  . PNA vac Low Risk Adult (2 of 2 - PCV13) 04/20/2009  . INFLUENZA VACCINE  Completed  . DEXA SCAN  Completed   Fall Risk  12/24/2016 03/11/2015  Falls in the past year? No No  Risk for fall due to : - Impaired balance/gait   Functional Status Survey:    Vitals:   05/13/17 1533  BP: 110/60  Pulse: 62  Resp: 18  Temp: 98 F (36.7 C)  TempSrc: Oral  SpO2: 95%  Weight: 96 lb 9.6 oz (43.8 kg)  Height: 5\' 2"  (1.575 m)   Body mass index is 17.67 kg/m.   Wt Readings from Last 3 Encounters:  05/13/17 96 lb 9.6 oz (43.8 kg)  04/14/17 98 lb 9.6 oz (44.7 kg)  03/19/17 95 lb (43.1 kg)   Physical Exam  Constitutional:  Thin built elderly female in no acute distress  HENT:  Head: Normocephalic and atraumatic.  Mouth/Throat: Oropharynx is clear and moist.  Missing teeth  Eyes: Conjunctivae are normal. Pupils are equal, round, and reactive to light. Right eye exhibits no discharge. Left eye exhibits no discharge.  Neck: Neck supple.  Cardiovascular: Normal rate and regular rhythm.  Pulmonary/Chest: Effort normal and breath sounds  normal. No respiratory distress. She has no wheezes. She has no rales.  Abdominal: Soft. Bowel sounds are normal. There is no tenderness.  Musculoskeletal: She exhibits deformity. She exhibits no edema.  Needs assistance with transfer, on wheelchair at times  Lymphadenopathy:    She has no cervical adenopathy.  Neurological:  Advanced dementia, does not interact  Skin: Skin is warm and dry. She is not diaphoretic.    Labs reviewed: Recent Labs    09/01/16 03/23/17  NA 141 142  K 4.2 4.1  BUN 26* 27*  CREATININE 1.0 0.8   Recent Labs    09/01/16 03/23/17  AST 21 22  ALT 11 14  ALKPHOS 65 85   Recent Labs    09/01/16 03/23/17  WBC  8.2 7.5  HGB 12.1 11.4*  HCT 38 33*  PLT 165 206   Lab Results  Component Value Date   TSH 2.42 09/01/2016   No results found for: HGBA1C Lab Results  Component Value Date   CHOL 131 01/09/2016   HDL 54 01/09/2016   LDLCALC 50 01/09/2016   TRIG 135 01/09/2016    Significant Diagnostic Results in last 30 days:  No results found.  Assessment/Plan  Hypertensive heart disease without heart failure Chart reviewed, few low BP readings of 110/60, 100/58 and 106/60. Given her advanced age, dementia and fraility, will need to avoid hypotension. Currently on isosorbide dinitrate 10 mg bid. Decrease this to 5 mg bid and monitor.   Depression With dementia, currently on sertraline 37.5 mg daily, continue this and monitor  Anemia of chronic disease Supportive care  Lumbosacral spondylosis Continue norco 5-325 mg bid with prn tylenol order and monitor. Assistance with transfer and supportive care. Hospice services.    Family/ staff Communication: reviewed care plan with patient and charge nurse.    Labs/tests ordered:  none   Oneal Grout, MD Internal Medicine [facility] [address] [place], [place] [address] Cell Phone (Monday-Friday 8 am - 5 pm): [phone] On Call: [phone] and follow  prompts after 5 pm and on weekends Office Phone: [phone] Office Fax: [phone]

## 2017-06-11 ENCOUNTER — Non-Acute Institutional Stay (SKILLED_NURSING_FACILITY): Payer: Medicare Other | Admitting: Nurse Practitioner

## 2017-06-11 DIAGNOSIS — K5901 Slow transit constipation: Secondary | ICD-10-CM | POA: Diagnosis not present

## 2017-06-11 DIAGNOSIS — F329 Major depressive disorder, single episode, unspecified: Secondary | ICD-10-CM

## 2017-06-11 DIAGNOSIS — F0390 Unspecified dementia without behavioral disturbance: Secondary | ICD-10-CM

## 2017-06-11 DIAGNOSIS — I10 Essential (primary) hypertension: Secondary | ICD-10-CM | POA: Diagnosis not present

## 2017-06-11 DIAGNOSIS — F0280 Dementia in other diseases classified elsewhere without behavioral disturbance: Secondary | ICD-10-CM

## 2017-06-11 DIAGNOSIS — F0393 Unspecified dementia, unspecified severity, with mood disturbance: Secondary | ICD-10-CM

## 2017-06-11 NOTE — Assessment & Plan Note (Addendum)
Her mood is not well controlled, she cried during today's visit, she denied pain or discomfort, on Norco 5/325mg  1/2 tab tid, increase Sertraline 50/37.5mg  daily.

## 2017-06-11 NOTE — Assessment & Plan Note (Signed)
The patient has history of dementia, resides in SNF, incontinent of urine, one person transfer, wheelchair to get around, continue Namenda for memory, under Hospice care.

## 2017-06-11 NOTE — Progress Notes (Signed)
Location:   SNF [facility] Room Number: 40A Place of Service:  SNF (31) Provider: Arna Snipe Nirav Sweda NP  Oneal Grout, MD  Patient Care Team: Oneal Grout, MD as PCP - General (Internal Medicine) [place] Nillie Bartolotta X, NP as Nurse Practitioner (Nurse Practitioner)  Extended Emergency Contact Information Primary Emergency Contact: [facility] Address: [address] [place]          [name],  [address] Home Phone: [phone] Relation: None  Code Status:  DNR Goals of care: Advanced Directive information Advanced Directives 05/13/2017  Does Patient Have a Medical Advance Directive? Yes  Type of Advance Directive Living will;Out of facility DNR (pink MOST or yellow form)  Does patient want to make changes to medical advance directive? No - Patient declined  Copy of Healthcare Power of Attorney in Chart? -  Pre-existing out of facility DNR order (yellow form or pink MOST form) Yellow form placed in chart (order not valid for inpatient use);Pink MOST form placed in chart (order not valid for inpatient use)     Chief Complaint  Patient presents with  . Medical Management of Chronic Issues    HPI:  Pt is a 84 y.o. female seen today for medical management of chronic diseases.    The patient has history of dementia, resides in SNF, incontinent of urine, one person transfer, wheelchair to get around, on  Namenda for memory, under Hospice care.  Her mood is not well controlled, she cried during today's visit, she denied pain or discomfort, on Norco 5/325mg  1/2 tab tiid, on Sertraline 37.5mg  daily. No constipation while on MiraLax daily, Senokot S 1 daily. No c/o chest pian, blood pressure is controlled, taking Isosorbide 5mg  bid.   Past Medical History:  Diagnosis Date  . Adjustment disorder with depressed mood   . Anemia   . Asthma   . Constipation   . Coronary atherosclerosis of native coronary artery   . Dementia in conditions classified elsewhere without  behavioral disturbance   . Depression   . DJD (degenerative joint disease)   . Edema   . GERD (gastroesophageal reflux disease)   . History of fall   . Hyperlipidemia   . Hypertension   . Insomnia   . Osteoporosis   . Pain in joint, pelvic region and thigh    left  . Right bundle branch block   . Unspecified constipation   . Unspecified urinary incontinence   . Urinary tract infection, site not specified    Past Surgical History:  Procedure Laterality Date  . ABDOMINAL HYSTERECTOMY    . CHOLECYSTECTOMY    . FEMUR IM NAIL  06/19/2011   Procedure: INTRAMEDULLARY (IM) NAIL FEMORAL;  Surgeon: Harvie Junior, MD;  Location: [facility] OR;  Service: Orthopedics;  Laterality: Left;  . SKIN CANCER EXCISION  02/15/2013   right forehead Dr. Irene Limbo    Allergies  Allergen Reactions  . Alendronate Sodium Other (See Comments)    Reaction:  Unknown   . Asmanex 120 Metered Doses [Mometasone Furoate] Other (See Comments)    Reaction:  Unknown   . Budesonide-Formoterol Fumarate Other (See Comments)    Reaction:  Unknown   . Fluticasone Propionate (Inhal) Other (See Comments)    Reaction:  Unknown   . Mometasone Furoate Other (See Comments)    Reaction:  Unknown   . Omeprazole Other (See Comments)    Reaction:  Unknown   . Valium [Diazepam] Other (See Comments)    Reaction:  Unknown  Allergies as of 06/11/2017      Reactions   Alendronate Sodium Other (See Comments)   Reaction:  Unknown    Asmanex 120 Metered Doses [mometasone Furoate] Other (See Comments)   Reaction:  Unknown    Budesonide-formoterol Fumarate Other (See Comments)   Reaction:  Unknown    Fluticasone Propionate (inhal) Other (See Comments)   Reaction:  Unknown    Mometasone Furoate Other (See Comments)   Reaction:  Unknown    Omeprazole Other (See Comments)   Reaction:  Unknown    Valium [diazepam] Other (See Comments)   Reaction:  Unknown       Medication List        Accurate as of 06/11/17 11:59 PM. Always use  your most recent med list.          acetaminophen 325 MG tablet Commonly known as:  TYLENOL Take 650 mg by mouth every 6 (six) hours as needed for mild pain or moderate pain.   ASPERCREME LIDOCAINE 4 % Ptch Generic drug:  Lidocaine Apply 1 patch topically.   aspirin 81 MG chewable tablet Chew 81 mg by mouth daily.   feeding supplement Liqd Take 1 Container by mouth 3 (three) times daily between meals.   HYDROcodone-acetaminophen 5-325 MG tablet Commonly known as:  NORCO/VICODIN Take 1 tablet by mouth 2 (two) times daily.   isosorbide dinitrate 10 MG tablet Commonly known as:  ISORDIL Take 10 mg by mouth 2 (two) times daily.   memantine 10 MG tablet Commonly known as:  NAMENDA Take 10 mg by mouth 2 (two) times daily.   polyethylene glycol packet Commonly known as:  MIRALAX / GLYCOLAX Take 17 g by mouth daily.   selenium sulfide 1 % Lotn Commonly known as:  SELSUN Apply 1 application topically. Shampoo on bath days every Monday and Thursday leave on 10-15 minutes, then rinse.   sennosides-docusate sodium 8.6-50 MG tablet Commonly known as:  SENOKOT-S Take 1 tablet by mouth at bedtime.   sertraline 50 MG tablet Commonly known as:  ZOLOFT Take 37.5 mg by mouth daily.      ROS was provided with assistance of staff Review of Systems  Constitutional: Positive for activity change. Negative for appetite change, chills, diaphoresis, fatigue and fever.  HENT: Positive for hearing loss. Negative for congestion, trouble swallowing and voice change.   Eyes: Negative for visual disturbance.  Respiratory: Negative for cough, chest tightness, shortness of breath and wheezing.   Cardiovascular: Negative for chest pain, palpitations and leg swelling.  Gastrointestinal: Negative for abdominal distention, constipation, diarrhea and vomiting.  Genitourinary: Negative for dysuria and urgency.       Incontinent of urine  Musculoskeletal: Positive for back pain and gait problem.        Wheelchair for mobility.  Skin: Negative for color change and pallor.  Neurological: Negative for tremors, speech difficulty and weakness.       Dementia.   Psychiatric/Behavioral: Positive for confusion. Negative for agitation, behavioral problems and sleep disturbance. The patient is not nervous/anxious.        Tearful, sad facial looks.     Immunization History  Administered Date(s) Administered  . Influenza Whole 02/24/2012, 02/23/2013  . Influenza-Unspecified 03/04/2014, 01/08/2015, 02/04/2016, 02/09/2017  . PPD Test 06/12/2014  . Pneumococcal Polysaccharide-23 04/20/2008  . Td 04/21/2003   Pertinent  Health Maintenance Due  Topic Date Due  . PNA vac Low Risk Adult (2 of 2 - PCV13) 04/20/2009  . INFLUENZA VACCINE  Completed  . DEXA  SCAN  Completed   Fall Risk  12/24/2016 03/11/2015  Falls in the past year? No No  Risk for fall due to : - Impaired balance/gait   Functional Status Survey:    Vitals:   06/11/17 1446  BP: 140/70  Pulse: 70  Resp: 20  Temp: 98.5 F (36.9 C)  SpO2: 94%   There is no height or weight on file to calculate BMI. Physical Exam  Constitutional: She appears well-developed and well-nourished.  HENT:  Head: Normocephalic and atraumatic.  Eyes: Conjunctivae and EOM are normal. Pupils are equal, round, and reactive to light.  Neck: Normal range of motion. Neck supple. No JVD present. No thyromegaly present.  Cardiovascular: Normal rate, regular rhythm and normal heart sounds.  Pulmonary/Chest: Effort normal and breath sounds normal. She has no wheezes. She has no rales.  Abdominal: Soft. Bowel sounds are normal. She exhibits no distension. There is no tenderness.  Musculoskeletal: She exhibits no edema or tenderness.  One person assist for transfer, wheelchair for mobility. Hx of lower back, left hip, left knee pain.   Neurological: She is alert. She exhibits normal muscle tone. Coordination normal.  Oriented to self.   Skin: Skin is warm and  dry.  Psychiatric:  Crying during my visit. Observed sad facial looks     Labs reviewed: Recent Labs    09/01/16 03/23/17  NA 141 142  K 4.2 4.1  BUN 26* 27*  CREATININE 1.0 0.8   Recent Labs    09/01/16 03/23/17  AST 21 22  ALT 11 14  ALKPHOS 65 85   Recent Labs    09/01/16 03/23/17  WBC 8.2 7.5  HGB 12.1 11.4*  HCT 38 33*  PLT 165 206   Lab Results  Component Value Date   TSH 2.42 09/01/2016   No results found for: HGBA1C Lab Results  Component Value Date   CHOL 131 01/09/2016   HDL 54 01/09/2016   LDLCALC 50 01/09/2016   TRIG 135 01/09/2016    Significant Diagnostic Results in last 30 days:  No results found.  Assessment/Plan  Essential hypertension, benign No c/o chest pian, blood pressure is controlled, continue Isosorbide 5mg  bid po    Dementia The patient has history of dementia, resides in SNF, incontinent of urine, one person transfer, wheelchair to get around, continue Namenda for memory, under Hospice care.    Depression due to dementia Her mood is not well controlled, she cried during today's visit, she denied pain or discomfort, on Norco 5/325mg  1/2 tab tid, increase Sertraline 50/37.5mg  daily.   Constipation No constipation, continue MiraLax daily, Senokot S 1 daily.    Family/ staff Communication: plan of care reviewed with the patient and charge nurse. Continue Hospice Service.  Labs/tests ordered:  None  Time spend 25 minutes.

## 2017-06-11 NOTE — Assessment & Plan Note (Addendum)
No c/o chest pian, blood pressure is controlled, continue Isosorbide 5mg  bid po

## 2017-06-11 NOTE — Assessment & Plan Note (Signed)
No constipation, continue MiraLax daily, Senokot S 1 daily.

## 2017-07-01 ENCOUNTER — Encounter: Payer: Self-pay | Admitting: Nurse Practitioner

## 2017-07-01 ENCOUNTER — Non-Acute Institutional Stay (SKILLED_NURSING_FACILITY): Payer: Medicare Other | Admitting: Nurse Practitioner

## 2017-07-01 DIAGNOSIS — I25700 Atherosclerosis of coronary artery bypass graft(s), unspecified, with unstable angina pectoris: Secondary | ICD-10-CM | POA: Diagnosis not present

## 2017-07-01 DIAGNOSIS — I10 Essential (primary) hypertension: Secondary | ICD-10-CM

## 2017-07-01 DIAGNOSIS — T148XXA Other injury of unspecified body region, initial encounter: Secondary | ICD-10-CM | POA: Diagnosis not present

## 2017-07-01 DIAGNOSIS — F0390 Unspecified dementia without behavioral disturbance: Secondary | ICD-10-CM

## 2017-07-01 NOTE — Assessment & Plan Note (Signed)
Continue  ASA 81mg  daily for cardiovascular risk reduction in setting of HTN/CAD, continue Isosorbide 10mg  bid po, no reported chest pain/angina since last visited.

## 2017-07-01 NOTE — Assessment & Plan Note (Signed)
Blood pressure is in control.  

## 2017-07-01 NOTE — Assessment & Plan Note (Signed)
she resides in SNF, w/c dependent, on Memantine for memory.  Close supervision for safety.

## 2017-07-01 NOTE — Progress Notes (Signed)
Location:  [facility] [place] [facility] Room Number: 40-A Place of Service:  SNF (31) Provider:  Mast, Manxie  NP  Oneal Grout, MD  Patient Care Team: Oneal Grout, MD as PCP - General (Internal Medicine) [place] Mast, Man X, NP as Nurse Practitioner (Nurse Practitioner)  Extended Emergency Contact Information Primary Emergency Contact: [facility] Address: [address] [place]          [name],  [address] Home Phone: [phone] Relation: None  Code Status:  DNR Goals of care: Advanced Directive information Advanced Directives 07/01/2017  Does Patient Have a Medical Advance Directive? Yes  Type of Advance Directive Living will;Out of facility DNR (pink MOST or yellow form)  Does patient want to make changes to medical advance directive? No - Patient declined  Copy of Healthcare Power of Attorney in Chart? -  Pre-existing out of facility DNR order (yellow form or pink MOST form) Yellow form placed in chart (order not valid for inpatient use)     Chief Complaint  Patient presents with  . Acute Visit    (L) hip is bruised    HPI:  Pt is a 84 y.o. female seen today for an acute visit for staff reported a large  bruise on the left hip noted 06/30/17. Unknown mechanism of injury due to the patient's advanced dementia, she resides in SNF, w/c dependent, on Memantine for memory.  She takes ASA 81mg  daily for cardiovascular risk reduction in setting of HTN/CAD, taking Isosorbide 10mg  bid po, no reported chest pain/angina since last visited.    Past Medical History:  Diagnosis Date  . Adjustment disorder with depressed mood   . Anemia   . Asthma   . Constipation   . Coronary atherosclerosis of native coronary artery   . Dementia in conditions classified elsewhere without behavioral disturbance   . Depression   . DJD (degenerative joint disease)   . Edema   . GERD (gastroesophageal reflux disease)   . History of fall   . Hyperlipidemia   .  Hypertension   . Insomnia   . Osteoporosis   . Pain in joint, pelvic region and thigh    left  . Right bundle branch block   . Unspecified constipation   . Unspecified urinary incontinence   . Urinary tract infection, site not specified    Past Surgical History:  Procedure Laterality Date  . ABDOMINAL HYSTERECTOMY    . CHOLECYSTECTOMY    . FEMUR IM NAIL  06/19/2011   Procedure: INTRAMEDULLARY (IM) NAIL FEMORAL;  Surgeon: Harvie Junior, MD;  Location: [facility] OR;  Service: Orthopedics;  Laterality: Left;  . SKIN CANCER EXCISION  02/15/2013   right forehead Dr. Irene Limbo    Allergies  Allergen Reactions  . Alendronate Sodium Other (See Comments)    Reaction:  Unknown   . Asmanex 120 Metered Doses [Mometasone Furoate] Other (See Comments)    Reaction:  Unknown   . Budesonide-Formoterol Fumarate Other (See Comments)    Reaction:  Unknown   . Fluticasone Propionate (Inhal) Other (See Comments)    Reaction:  Unknown   . Mometasone Furoate Other (See Comments)    Reaction:  Unknown   . Omeprazole Other (See Comments)    Reaction:  Unknown   . Valium [Diazepam] Other (See Comments)    Reaction:  Unknown     Outpatient Encounter Medications as of 07/01/2017  Medication Sig  . acetaminophen (TYLENOL) 325 MG tablet Take 650 mg by mouth every 6 (six) hours  as needed for mild pain or moderate pain.  Marland Kitchen aspirin 81 MG chewable tablet Chew 81 mg by mouth daily.  . feeding supplement (BOOST / RESOURCE BREEZE) LIQD Take 1 Container by mouth 3 (three) times daily between meals.   Marland Kitchen HYDROcodone-acetaminophen (NORCO/VICODIN) 5-325 MG tablet Take 1 tablet by mouth 2 (two) times daily.  . isosorbide dinitrate (ISORDIL) 10 MG tablet Take 10 mg by mouth 2 (two) times daily.   . Lidocaine (ASPERCREME LIDOCAINE) 4 % PTCH Apply 1 patch topically.  . memantine (NAMENDA) 10 MG tablet Take 10 mg by mouth 2 (two) times daily.  . polyethylene glycol (MIRALAX / GLYCOLAX) packet Take 17 g by mouth daily.  Marland Kitchen  selenium sulfide (SELSUN) 1 % LOTN Apply 1 application topically. Shampoo on bath days every Monday and Thursday leave on 10-15 minutes, then rinse.  . sennosides-docusate sodium (SENOKOT-S) 8.6-50 MG tablet Take 1 tablet by mouth at bedtime.   . sertraline (ZOLOFT) 50 MG tablet Take 37.5 mg by mouth daily.    No facility-administered encounter medications on file as of 07/01/2017.     Review of Systems  Constitutional: Negative for activity change, appetite change, chills, diaphoresis, fatigue and fever.  Respiratory: Negative for cough, chest tightness, shortness of breath and wheezing.   Cardiovascular: Negative for chest pain, palpitations and leg swelling.  Musculoskeletal: Positive for arthralgias and gait problem.  Skin:       A bruise in the left hip region.   Neurological:       Dementia.  Psychiatric/Behavioral: Positive for confusion. Negative for agitation and behavioral problems.    Immunization History  Administered Date(s) Administered  . Influenza Whole 02/24/2012, 02/23/2013  . Influenza-Unspecified 03/04/2014, 01/08/2015, 02/04/2016, 02/09/2017  . PPD Test 06/12/2014  . Pneumococcal Polysaccharide-23 04/20/2008  . Td 04/21/2003   Pertinent  Health Maintenance Due  Topic Date Due  . PNA vac Low Risk Adult (2 of 2 - PCV13) 04/20/2009  . INFLUENZA VACCINE  Completed  . DEXA SCAN  Completed   Fall Risk  12/24/2016 03/11/2015  Falls in the past year? No No  Risk for fall due to : - Impaired balance/gait   Functional Status Survey:    Vitals:   07/01/17 1241  BP: (!) 130/52  Pulse: 75  Resp: 16  Temp: 98.6 F (37 C)  SpO2: 98%  Weight: 98 lb 11.2 oz (44.8 kg)  Height: 5\' 2"  (1.575 m)   Body mass index is 18.05 kg/m. Physical Exam  Constitutional: She appears well-developed and well-nourished. No distress.  HENT:  Head: Normocephalic and atraumatic.  Eyes: Conjunctivae and EOM are normal. Pupils are equal, round, and reactive to light.    Cardiovascular: Normal rate and regular rhythm.  No murmur heard. Musculoskeletal: Normal range of motion. She exhibits no edema or tenderness.  One person transfer, wheelchair dependent. The patient made grimace face and tiptoed on her left leg when she was assisted to stand and PROM made to the left hip  Skin: Skin is warm and dry. She is not diaphoretic.  A bruise at left hip region.   Psychiatric: She has a normal mood and affect. Her behavior is normal.    Labs reviewed: Recent Labs    09/01/16 03/23/17  NA 141 142  K 4.2 4.1  BUN 26* 27*  CREATININE 1.0 0.8   Recent Labs    09/01/16 03/23/17  AST 21 22  ALT 11 14  ALKPHOS 65 85   Recent Labs    09/01/16  03/23/17  WBC 8.2 7.5  HGB 12.1 11.4*  HCT 38 33*  PLT 165 206   Lab Results  Component Value Date   TSH 2.42 09/01/2016   No results found for: HGBA1C Lab Results  Component Value Date   CHOL 131 01/09/2016   HDL 54 01/09/2016   LDLCALC 50 01/09/2016   TRIG 135 01/09/2016    Significant Diagnostic Results in last 30 days:  No results found.  Assessment/Plan Bruise A large bruise on the left hip noted 06/30/17. Unknown mechanism of injury due to the patient's advanced dementia, the patient grimaced when she was assisted to stand with her hand grabbing the bar and when her left leg was moved by PROM, unable to determine if how severer her pain is. S/p ORIF left hip. Will obtain X-ray left hip and left femur ap, lateral, and oblique views. Continue to observe.   Dementia she resides in SNF, w/c dependent, on Memantine for memory.  Close supervision for safety.    CAD (coronary artery disease) of artery bypass graft Continue  ASA 81mg  daily for cardiovascular risk reduction in setting of HTN/CAD, continue Isosorbide 10mg  bid po, no reported chest pain/angina since last visited.    Essential hypertension, benign Blood pressure is in control.      Family/ staff Communication: plan of care reviewed with  the patient and charge nurse.   Labs/tests ordered: X-ray left hip/femur ap, lateral, oblique  Time spend 25 minutes.

## 2017-07-01 NOTE — Assessment & Plan Note (Addendum)
A large bruise on the left hip noted 06/30/17. Unknown mechanism of injury due to the patient's advanced dementia, the patient grimaced when she was assisted to stand with her hand grabbing the bar and when her left leg was moved by PROM, unable to determine if how severer her pain is. S/p ORIF left hip. Will obtain X-ray left hip and left femur ap, lateral, and oblique views. Continue to observe.

## 2017-07-05 ENCOUNTER — Other Ambulatory Visit: Payer: Self-pay

## 2017-07-05 ENCOUNTER — Emergency Department (HOSPITAL_COMMUNITY)

## 2017-07-05 ENCOUNTER — Emergency Department (HOSPITAL_COMMUNITY)
Admission: EM | Admit: 2017-07-05 | Discharge: 2017-07-06 | Disposition: A | Discharge status: Home / Self Care | Attending: Physician Assistant | Admitting: Physician Assistant

## 2017-07-05 ENCOUNTER — Encounter (HOSPITAL_COMMUNITY): Payer: Self-pay | Admitting: Emergency Medicine

## 2017-07-05 DIAGNOSIS — Y939 Activity, unspecified: Secondary | ICD-10-CM | POA: Insufficient documentation

## 2017-07-05 DIAGNOSIS — S42002A Fracture of unspecified part of left clavicle, initial encounter for closed fracture: Secondary | ICD-10-CM

## 2017-07-05 DIAGNOSIS — Z7982 Long term (current) use of aspirin: Secondary | ICD-10-CM | POA: Diagnosis not present

## 2017-07-05 DIAGNOSIS — W19XXXA Unspecified fall, initial encounter: Secondary | ICD-10-CM

## 2017-07-05 DIAGNOSIS — Z23 Encounter for immunization: Secondary | ICD-10-CM | POA: Diagnosis not present

## 2017-07-05 DIAGNOSIS — F0390 Unspecified dementia without behavioral disturbance: Secondary | ICD-10-CM | POA: Diagnosis not present

## 2017-07-05 DIAGNOSIS — Y929 Unspecified place or not applicable: Secondary | ICD-10-CM | POA: Insufficient documentation

## 2017-07-05 DIAGNOSIS — Z79899 Other long term (current) drug therapy: Secondary | ICD-10-CM | POA: Insufficient documentation

## 2017-07-05 DIAGNOSIS — I2510 Atherosclerotic heart disease of native coronary artery without angina pectoris: Secondary | ICD-10-CM | POA: Diagnosis not present

## 2017-07-05 DIAGNOSIS — I119 Hypertensive heart disease without heart failure: Secondary | ICD-10-CM | POA: Diagnosis not present

## 2017-07-05 DIAGNOSIS — Y999 Unspecified external cause status: Secondary | ICD-10-CM | POA: Insufficient documentation

## 2017-07-05 DIAGNOSIS — S4992XA Unspecified injury of left shoulder and upper arm, initial encounter: Secondary | ICD-10-CM | POA: Diagnosis present

## 2017-07-05 DIAGNOSIS — J45909 Unspecified asthma, uncomplicated: Secondary | ICD-10-CM | POA: Diagnosis not present

## 2017-07-05 MED ORDER — TETANUS-DIPHTH-ACELL PERTUSSIS 5-2.5-18.5 LF-MCG/0.5 IM SUSP
0.5000 mL | Freq: Once | INTRAMUSCULAR | Status: AC
  Administered 2017-07-05: 0.5 mL via INTRAMUSCULAR
  Filled 2017-07-05: qty 0.5

## 2017-07-05 NOTE — ED Triage Notes (Signed)
[facility]itnessed [facility]  Pt was found by EMS lying on the floor in a supine position  Alert to her baseline, pt has dementia  Pt is not on any blood thinners  Pt has a 3 inch lac to the back of her head  Bleeding controlled  Pt is also c/o left shoulder pain, questionable deformity  Pt has had her nightly medication and was given a dose of pain medication prior to transport
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
## 2017-07-05 NOTE — Discharge Instructions (Signed)
Joann Baker was found to have a clavicle fracture.  In particular this clavicle fracture is pushing up against the skin.  We try to minimize the amount that is pushing up against the skin so we provided her with a sling and swath.  Dr. Aundria Rudogers the orthopedist recommends he follow-up with him within 1 week.  The most important thing is to pay attention to make sure that the skin does not erode on top of where the fracture is.

## 2017-07-06 ENCOUNTER — Non-Acute Institutional Stay (SKILLED_NURSING_FACILITY): Payer: Medicare Other | Admitting: Nurse Practitioner

## 2017-07-06 ENCOUNTER — Encounter: Payer: Self-pay | Admitting: *Deleted

## 2017-07-06 DIAGNOSIS — S0101XD Laceration without foreign body of scalp, subsequent encounter: Secondary | ICD-10-CM | POA: Diagnosis not present

## 2017-07-06 DIAGNOSIS — Z9181 History of falling: Secondary | ICD-10-CM

## 2017-07-06 DIAGNOSIS — S60519D Abrasion of unspecified hand, subsequent encounter: Secondary | ICD-10-CM

## 2017-07-06 DIAGNOSIS — F0390 Unspecified dementia without behavioral disturbance: Secondary | ICD-10-CM | POA: Diagnosis not present

## 2017-07-06 DIAGNOSIS — S42012D Anterior displaced fracture of sternal end of left clavicle, subsequent encounter for fracture with routine healing: Secondary | ICD-10-CM | POA: Diagnosis not present

## 2017-07-06 DIAGNOSIS — S42009A Fracture of unspecified part of unspecified clavicle, initial encounter for closed fracture: Secondary | ICD-10-CM | POA: Insufficient documentation

## 2017-07-06 DIAGNOSIS — S60519A Abrasion of unspecified hand, initial encounter: Secondary | ICD-10-CM | POA: Insufficient documentation

## 2017-07-06 NOTE — Assessment & Plan Note (Addendum)
s/p fall, left clavicle fx, scalp laceration, R+L hand abrasions. The patient fell 07/05/17 evening when she tried to get up in hallway.ED evaluation: CT head, cervical spine, X-ray chest and left shoulder done in ED. She was received Tdap, 3 staples on top of head for scalp laceration.  Continue Nocor 5/325mg  1/2 tab bid for pain. Sling left arm for support. F/u Ortho one week

## 2017-07-06 NOTE — Assessment & Plan Note (Signed)
Hx of dementia, resides in SNF, w/c for mobility, one person transfer, under Hospice service. Close supervision for safety.

## 2017-07-06 NOTE — Assessment & Plan Note (Signed)
R 4th finger, left 1st MCJ and thumb abrasion, mild swelling and bruise, PROM fingers with mild pain.

## 2017-07-06 NOTE — Assessment & Plan Note (Signed)
She was received Tdap, 3 staples on top of head for scalp laceration. No sign of infection, removal 7-10 days.

## 2017-07-06 NOTE — Progress Notes (Signed)
Location:  [facility] Room Number: 40-A Place of Service:  SNF (31) Provider:  Mast, Manxie  NP  Oneal Grout, MD  Patient Care Team: Oneal Grout, MD as PCP - General (Internal Medicine) [place] Mast, Man X, NP as Nurse Practitioner (Nurse Practitioner)  Extended Emergency Contact Information Primary Emergency Contact: [facility] Address: [address] [place]          [name]  [address] Mobile Phone: [phone] Relation: Daughter Secondary Emergency Contact: Memory Dance Address: [address] [place], [place] [address] [place] Home Phone: [phone] Relation: Son  Code Status:  DNR Goals of care: Advanced Directive information Advanced Directives 07/05/2017  Does Patient Have a Medical Advance Directive? Yes  Type of Advance Directive Out of facility DNR (pink MOST or yellow form)  Does patient want to make changes to medical advance directive? No - Patient declined  Copy of Healthcare Power of Attorney in Chart? -  Pre-existing out of facility DNR order (yellow form or pink MOST form) Pink MOST form placed in chart (order not valid for inpatient use);Yellow form placed in chart (order not valid for inpatient use)     Chief Complaint  Patient presents with  . Acute Visit    Fell->  fx (L) clavicle    HPI:  Pt is a 84 y.o. female seen today for an acute visit for    Past Medical History:  Diagnosis Date  . Adjustment disorder with depressed mood   . Anemia   . Asthma   . Constipation   . Coronary atherosclerosis of native coronary artery   . Dementia in conditions classified elsewhere without behavioral disturbance   . Depression   . DJD (degenerative joint disease)   . Edema   . GERD (gastroesophageal reflux disease)   . History of fall   . Hyperlipidemia   . Hypertension   . Insomnia   . Osteoporosis   . Pain in joint, pelvic region and thigh    left  . Right bundle branch block   .  Unspecified constipation   . Unspecified urinary incontinence   . Urinary tract infection, site not specified    Past Surgical History:  Procedure Laterality Date  . ABDOMINAL HYSTERECTOMY    . CHOLECYSTECTOMY    . FEMUR IM NAIL  06/19/2011   Procedure: INTRAMEDULLARY (IM) NAIL FEMORAL;  Surgeon: Harvie Junior, MD;  Location: [facility] OR;  Service: Orthopedics;  Laterality: Left;  . SKIN CANCER EXCISION  02/15/2013   right forehead Dr. Irene Limbo    Allergies  Allergen Reactions  . Alendronate Sodium Other (See Comments)    Reaction:  Unknown   . Asmanex 120 Metered Doses [Mometasone Furoate] Other (See Comments)    Reaction:  Unknown   . Budesonide-Formoterol Fumarate Other (See Comments)    Reaction:  Unknown   . Fluticasone Propionate (Inhal) Other (See Comments)    Reaction:  Unknown   . Mometasone Furoate Other (See Comments)    Reaction:  Unknown   . Omeprazole Other (See Comments)    Reaction:  Unknown   . Valium [Diazepam] Other (See Comments)    Reaction:  Unknown     Outpatient Encounter Medications as of 07/06/2017  Medication Sig  . acetaminophen (TYLENOL) 325 MG tablet Take 650 mg by mouth every 6 (six) hours as needed for mild pain or moderate pain.  Marland Kitchen aspirin 81 MG chewable tablet Chew  81 mg by mouth daily.  . feeding supplement (BOOST / RESOURCE BREEZE) LIQD Take 1 Container by mouth 3 (three) times daily between meals.   . fluorouracil (EFUDEX) 5 % cream Apply 1 application topically 2 (two) times daily.  Marland Kitchen. HYDROcodone-acetaminophen (NORCO/VICODIN) 5-325 MG tablet Take 0.5 tablets by mouth 2 (two) times daily.   . isosorbide dinitrate (ISORDIL) 10 MG tablet Take 5 mg by mouth 2 (two) times daily.   . Lidocaine (ASPERCREME LIDOCAINE) 4 % PTCH Apply 1 patch topically.  . memantine (NAMENDA) 10 MG tablet Take 10 mg by mouth 2 (two) times daily.  . polyethylene glycol (MIRALAX / GLYCOLAX) packet Take 17 g by mouth daily.  Marland Kitchen. selenium sulfide (SELSUN) 1 % LOTN Apply 1  application topically. Shampoo on bath days every Monday and Thursday leave on 10-15 minutes, then rinse.  . sennosides-docusate sodium (SENOKOT-S) 8.6-50 MG tablet Take 1 tablet by mouth at bedtime.   . sertraline (ZOLOFT) 50 MG tablet Take 50 mg by mouth daily.    No facility-administered encounter medications on file as of 07/06/2017.     Review of Systems  Immunization History  Administered Date(s) Administered  . Influenza Whole 02/24/2012, 02/23/2013  . Influenza-Unspecified 03/04/2014, 01/08/2015, 02/04/2016, 02/09/2017  . PPD Test 06/12/2014  . Pneumococcal Polysaccharide-23 04/20/2008  . Td 04/21/2003  . Tdap 07/05/2017   Pertinent  Health Maintenance Due  Topic Date Due  . PNA vac Low Risk Adult (2 of 2 - PCV13) 04/20/2009  . INFLUENZA VACCINE  Completed  . DEXA SCAN  Completed   Fall Risk  12/24/2016 03/11/2015  Falls in the past year? No No  Risk for fall due to : - Impaired balance/gait   Functional Status Survey:    Vitals:   07/06/17 1013  BP: 140/70  Pulse: 62  Resp: 20  Temp: 98.2 F (36.8 C)  SpO2: 96%  Weight: 98 lb 11.2 oz (44.8 kg)  Height: 5\' 2"  (1.575 m)   Body mass index is 18.05 kg/m. Physical Exam  Labs reviewed: Recent Labs    09/01/16 03/23/17  NA 141 142  K 4.2 4.1  BUN 26* 27*  CREATININE 1.0 0.8   Recent Labs    09/01/16 03/23/17  AST 21 22  ALT 11 14  ALKPHOS 65 85   Recent Labs    09/01/16 03/23/17  WBC 8.2 7.5  HGB 12.1 11.4*  HCT 38 33*  PLT 165 206   Lab Results  Component Value Date   TSH 2.42 09/01/2016   No results found for: HGBA1C Lab Results  Component Value Date   CHOL 131 01/09/2016   HDL 54 01/09/2016   LDLCALC 50 01/09/2016   TRIG 135 01/09/2016    Significant Diagnostic Results in last 30 days:  Dg Chest 2 View  Result Date: 07/05/2017 CLINICAL DATA:  Unwitnessed fall today, deformity to left shoulder. EXAM: CHEST - 2 VIEW COMPARISON:  Chest x-ray dated 06/19/2011. FINDINGS: Heart size is  upper normal. Overall cardiomediastinal silhouette is within normal limits. Atherosclerotic changes noted at the aortic arch. Lungs are clear.  No pleural effusion or pneumothorax seen. Markedly displaced fracture of the distal left clavicle. No other osseous fracture or displacement seen. Ankylosis of the kyphotic thoracic spine. IMPRESSION: 1. No active cardiopulmonary disease. No evidence of pneumonia or pulmonary edema. 2. Markedly displaced fracture of the distal left clavicle, with marked superior displacement of the proximal fracture fragment. 3. Aortic atherosclerosis. Electronically Signed   By: Anne NgStan  Maynard M.D.  On: 07/05/2017 21:46   Ct Head Wo Contrast  Result Date: 07/05/2017 CLINICAL DATA:  Fall today with neck pain.  Dementia patient. EXAM: CT HEAD WITHOUT CONTRAST CT CERVICAL SPINE WITHOUT CONTRAST TECHNIQUE: Multidetector CT imaging of the head and cervical spine was performed following the standard protocol without intravenous contrast. Multiplanar CT image reconstructions of the cervical spine were also generated. COMPARISON:  None. FINDINGS: CT HEAD FINDINGS Brain: Moderate generalized atrophy, slightly more pronounced in the temporal lobes. Mild to moderate chronic small vessel ischemia. No intracranial hemorrhage, mass effect, or midline shift. No hydrocephalus. The basilar cisterns are patent. No evidence of territorial infarct or acute ischemia. No extra-axial or intracranial fluid collection. Vascular: Atherosclerosis of skullbase vasculature without hyperdense vessel or abnormal calcification. Skull: No fracture or focal lesion. Sinuses/Orbits: No acute finding. Other: None. CT CERVICAL SPINE FINDINGS Alignment: Exaggerated upper thoracic kyphosis with straightening of normal cervical lordosis. No traumatic subluxation. Skull base and vertebrae: No acute fracture. Vertebral body heights are maintained. The dens and skull base are intact. Bones are osteopenic/osteoporotic. Soft  tissues and spinal canal: No prevertebral fluid or swelling. No visible canal hematoma. Disc levels: Diffuse disc space narrowing and endplate spurring. Mild multilevel facet arthropathy. Upper chest: Biapical pleuroparenchymal scarring. Other: Carotid calcifications. IMPRESSION: 1.  No acute intracranial abnormality.  No skull fracture. 2. Generalized atrophy and chronic small vessel ischemia. 3. Multilevel degenerative change throughout the cervical spine without acute fracture or subluxation. 4. Carotid and intracranial atherosclerosis. Electronically Signed   By: Rubye Oaks M.D.   On: 07/05/2017 22:07   Ct Cervical Spine Wo Contrast  Result Date: 07/05/2017 CLINICAL DATA:  Fall today with neck pain.  Dementia patient. EXAM: CT HEAD WITHOUT CONTRAST CT CERVICAL SPINE WITHOUT CONTRAST TECHNIQUE: Multidetector CT imaging of the head and cervical spine was performed following the standard protocol without intravenous contrast. Multiplanar CT image reconstructions of the cervical spine were also generated. COMPARISON:  None. FINDINGS: CT HEAD FINDINGS Brain: Moderate generalized atrophy, slightly more pronounced in the temporal lobes. Mild to moderate chronic small vessel ischemia. No intracranial hemorrhage, mass effect, or midline shift. No hydrocephalus. The basilar cisterns are patent. No evidence of territorial infarct or acute ischemia. No extra-axial or intracranial fluid collection. Vascular: Atherosclerosis of skullbase vasculature without hyperdense vessel or abnormal calcification. Skull: No fracture or focal lesion. Sinuses/Orbits: No acute finding. Other: None. CT CERVICAL SPINE FINDINGS Alignment: Exaggerated upper thoracic kyphosis with straightening of normal cervical lordosis. No traumatic subluxation. Skull base and vertebrae: No acute fracture. Vertebral body heights are maintained. The dens and skull base are intact. Bones are osteopenic/osteoporotic. Soft tissues and spinal canal: No  prevertebral fluid or swelling. No visible canal hematoma. Disc levels: Diffuse disc space narrowing and endplate spurring. Mild multilevel facet arthropathy. Upper chest: Biapical pleuroparenchymal scarring. Other: Carotid calcifications. IMPRESSION: 1.  No acute intracranial abnormality.  No skull fracture. 2. Generalized atrophy and chronic small vessel ischemia. 3. Multilevel degenerative change throughout the cervical spine without acute fracture or subluxation. 4. Carotid and intracranial atherosclerosis. Electronically Signed   By: Rubye Oaks M.D.   On: 07/05/2017 22:07   Dg Shoulder Left  Result Date: 07/05/2017 CLINICAL DATA:  Unwitnessed fall today, deformity noted to superior left shoulder. EXAM: LEFT SHOULDER - 2+ VIEW COMPARISON:  None. FINDINGS: Displaced fracture of the distal left clavicle, marked superior elevation of the proximal fracture fragment. Left humeral head appears normally positioned relative to the glenoid fossa. Left upper ribs appear intact and normally aligned. IMPRESSION:  Significantly displaced fracture of the distal left clavicle, with marked superior dislocation of the proximal fracture fragment. Electronically Signed   By: Bary Richard M.D.   On: 07/05/2017 21:45    Assessment/Plan There are no diagnoses linked to this encounter.   Family/ staff Communication:   Labs/tests ordered:

## 2017-07-06 NOTE — Progress Notes (Signed)
Location:   SNF [facility] Room Number: 40-A Place of Service:  SNF (31) Provider: [facility] LLC[facility]ie Henretter Piekarski NP  Oneal GroutPandey, Mahima, MD  Patient Care Team: Oneal GroutPandey, Mahima, MD as PCP - General (Internal Medicine) [place] Ravinder Lukehart X, NP as Nurse Practitioner (Nurse Practitioner)  Extended Emergency Contact Information Primary Emergency Contact: [facility] Address: [address]          [place],  [id number] Mobile Phone: [phone] Relation: Daughter Secondary Emergency Contact: Memory DanceHolden, Chip Address: [address]          [place], [place] [id number] [place] Home Phone: [phone] Relation: Son  Code Status: DNR Goals of care: Advanced Directive information Advanced Directives 07/05/2017  Does Patient Have a Medical Advance Directive? Yes  Type of Advance Directive Out of facility DNR (pink MOST or yellow form)  Does patient want to make changes to medical advance directive? No - Patient declined  Copy of Healthcare Power of Attorney in Chart? -  Pre-existing out of facility DNR order (yellow form or pink MOST form) Pink MOST form placed in chart (order not valid for inpatient use);Yellow form placed in chart (order not valid for inpatient use)     Chief Complaint  Patient presents with  . Acute Visit    Fell->  fx (L) clavicle    HPI:  Pt is a 84 y.o. female seen today for an acute visit for s/p fall, left clavicle fx, scalp laceration, R+L hand abrasions. The patient fell 07/05/17 evening when she tried to get up in hallway.ED evaluation: CT head, cervical spine, X-ray chest and left shoulder done in ED. She was received Tdap, 3 staples on top of head for scalp laceration.    Hx of dementia, resides in SNF, w/c for mobility, one person transfer, under Hospice service. She takes Nocor 5/325mg  1/2 tab bid for arthritic pain.  Past Medical History:  Diagnosis Date  . Adjustment disorder with depressed mood   . Anemia   . Asthma   . Constipation     . Coronary atherosclerosis of native coronary artery   . Dementia in conditions classified elsewhere without behavioral disturbance   . Depression   . DJD (degenerative joint disease)   . Edema   . GERD (gastroesophageal reflux disease)   . History of fall   . Hyperlipidemia   . Hypertension   . Insomnia   . Osteoporosis   . Pain in joint, pelvic region and thigh    left  . Right bundle branch block   . Unspecified constipation   . Unspecified urinary incontinence   . Urinary tract infection, site not specified    Past Surgical History:  Procedure Laterality Date  . ABDOMINAL HYSTERECTOMY    . CHOLECYSTECTOMY    . FEMUR IM NAIL  06/19/2011   Procedure: INTRAMEDULLARY (IM) NAIL FEMORAL;  Surgeon: Harvie JuniorJohn L Graves, MD;  Location: [facility] OR;  Service: Orthopedics;  Laterality: Left;  . SKIN CANCER EXCISION  02/15/2013   right forehead Dr. Irene LimboGoodrich    Allergies  Allergen Reactions  . Alendronate Sodium Other (See Comments)    Reaction:  Unknown   . Asmanex 120 Metered Doses [Mometasone Furoate] Other (See Comments)    Reaction:  Unknown   . Budesonide-Formoterol Fumarate Other (See Comments)    Reaction:  Unknown   . Fluticasone Propionate (Inhal) Other (See Comments)    Reaction:  Unknown   . Mometasone Furoate Other (See Comments)    Reaction:  Unknown   . Omeprazole Other (See Comments)    Reaction:  Unknown   . Valium [Diazepam] Other (See Comments)    Reaction:  Unknown     Allergies as of 07/06/2017      Reactions   Alendronate Sodium Other (See Comments)   Reaction:  Unknown    Asmanex 120 Metered Doses [mometasone Furoate] Other (See Comments)   Reaction:  Unknown    Budesonide-formoterol Fumarate Other (See Comments)   Reaction:  Unknown    Fluticasone Propionate (inhal) Other (See Comments)   Reaction:  Unknown    Mometasone Furoate Other (See Comments)   Reaction:  Unknown    Omeprazole Other (See Comments)   Reaction:  Unknown    Valium [diazepam] Other  (See Comments)   Reaction:  Unknown       Medication List        Accurate as of 07/06/17 11:59 PM. Always use your most recent med list.          acetaminophen 325 MG tablet Commonly known as:  TYLENOL Take 650 mg by mouth every 6 (six) hours as needed for mild pain or moderate pain.   ASPERCREME LIDOCAINE 4 % Ptch Generic drug:  Lidocaine Apply 1 patch topically.   aspirin 81 MG chewable tablet Chew 81 mg by mouth daily.   feeding supplement Liqd Take 1 Container by mouth 3 (three) times daily between meals.   fluorouracil 5 % cream Commonly known as:  EFUDEX Apply 1 application topically 2 (two) times daily.   HYDROcodone-acetaminophen 5-325 MG tablet Commonly known as:  NORCO/VICODIN Take 0.5 tablets by mouth 2 (two) times daily.   isosorbide dinitrate 10 MG tablet Commonly known as:  ISORDIL Take 5 mg by mouth 2 (two) times daily.   memantine 10 MG tablet Commonly known as:  NAMENDA Take 10 mg by mouth 2 (two) times daily.   polyethylene glycol packet Commonly known as:  MIRALAX / GLYCOLAX Take 17 g by mouth daily.   selenium sulfide 1 % Lotn Commonly known as:  SELSUN Apply 1 application topically. Shampoo on bath days every Monday and Thursday leave on 10-15 minutes, then rinse.   sennosides-docusate sodium 8.6-50 MG tablet Commonly known as:  SENOKOT-S Take 1 tablet by mouth at bedtime.   sertraline 50 MG tablet Commonly known as:  ZOLOFT Take 50 mg by mouth daily.      ROS was provided with assistance of staff Review of Systems  Constitutional: Negative for activity change, appetite change, chills, diaphoresis, fatigue and fever.  HENT: Positive for hearing loss. Negative for trouble swallowing and voice change.   Eyes: Negative for visual disturbance.  Respiratory: Negative for cough, choking, shortness of breath and wheezing.   Cardiovascular: Negative for chest pain, palpitations and leg swelling.  Gastrointestinal: Negative for abdominal  distention, abdominal pain, constipation, diarrhea, nausea and vomiting.  Genitourinary: Negative for dysuria and urgency.       Incontinent of urine.   Musculoskeletal: Positive for arthralgias, gait problem and joint swelling.  Skin: Positive for color change and wound.  Neurological: Negative for dizziness, tremors, speech difficulty, weakness and headaches.       Dementia  Psychiatric/Behavioral: Positive for confusion. Negative for agitation, behavioral problems, hallucinations and sleep disturbance. The patient is not nervous/anxious.     Immunization History  Administered Date(s) Administered  . Influenza Whole 02/24/2012, 02/23/2013  . Influenza-Unspecified 03/04/2014, 01/08/2015, 02/04/2016, 02/09/2017  . PPD Test 06/12/2014  . Pneumococcal Polysaccharide-23 04/20/2008  . Td  04/21/2003  . Tdap 07/05/2017   Pertinent  Health Maintenance Due  Topic Date Due  . PNA vac Low Risk Adult (2 of 2 - PCV13) 04/20/2009  . INFLUENZA VACCINE  Completed  . DEXA SCAN  Completed   Fall Risk  12/24/2016 03/11/2015  Falls in the past year? No No  Risk for fall due to : - Impaired balance/gait   Functional Status Survey:    Vitals:   07/06/17 1013  BP: 140/70  Pulse: 62  Resp: 20  Temp: 98.2 F (36.8 C)  SpO2: 96%  Weight: 98 lb 11.2 oz (44.8 kg)  Height: 5\' 2"  (1.575 m)   Body mass index is 18.05 kg/m. Physical Exam  Constitutional: She appears well-developed and well-nourished. No distress.  HENT:  Head: Normocephalic.  Top of head trauma  Eyes: Conjunctivae and EOM are normal. Pupils are equal, round, and reactive to light.  Neck: Normal range of motion. Neck supple. No JVD present. No thyromegaly present.  Cardiovascular: Normal rate and regular rhythm.  No murmur heard. Pulmonary/Chest: Effort normal and breath sounds normal. She has no wheezes. She has no rales.  Abdominal: Soft. Bowel sounds are normal. She exhibits no distension. There is no tenderness.    Musculoskeletal: She exhibits edema, tenderness and deformity.  Left clavicle closed, displace fracture, in sling.   Neurological: She is alert. She exhibits normal muscle tone. Coordination normal.  Oriented to self.   Skin: Skin is warm and dry. She is not diaphoretic.  Top of head staple closure x3, no sign of infection, R 4th finger, left 1st MCJ and thumb abrasion, mild swelling.   Psychiatric: She has a normal mood and affect.    Labs reviewed: Recent Labs    09/01/16 03/23/17 07/08/17  NA 141 142 141  K 4.2 4.1 4.4  BUN 26* 27* 32*  CREATININE 1.0 0.8 0.8   Recent Labs    09/01/16 03/23/17 07/08/17  AST 21 22 18   ALT 11 14 10   ALKPHOS 65 85 88   Recent Labs    09/01/16 03/23/17 07/08/17  WBC 8.2 7.5 8.4  HGB 12.1 11.4* 8.9*  HCT 38 33* 26*  PLT 165 206 177   Lab Results  Component Value Date   TSH 2.42 09/01/2016   No results found for: HGBA1C Lab Results  Component Value Date   CHOL 131 01/09/2016   HDL 54 01/09/2016   LDLCALC 50 01/09/2016   TRIG 135 01/09/2016    Significant Diagnostic Results in last 30 days:  Dg Chest 2 View  Result Date: 07/05/2017 CLINICAL DATA:  Unwitnessed fall today, deformity to left shoulder. EXAM: CHEST - 2 VIEW COMPARISON:  Chest x-ray dated 06/19/2011. FINDINGS: Heart size is upper normal. Overall cardiomediastinal silhouette is within normal limits. Atherosclerotic changes noted at the aortic arch. Lungs are clear.  No pleural effusion or pneumothorax seen. Markedly displaced fracture of the distal left clavicle. No other osseous fracture or displacement seen. Ankylosis of the kyphotic thoracic spine. IMPRESSION: 1. No active cardiopulmonary disease. No evidence of pneumonia or pulmonary edema. 2. Markedly displaced fracture of the distal left clavicle, with marked superior displacement of the proximal fracture fragment. 3. Aortic atherosclerosis. Electronically Signed   By: Bary Richard M.D.   On: 07/05/2017 21:46   Ct Head  Wo Contrast  Result Date: 07/05/2017 CLINICAL DATA:  Fall today with neck pain.  Dementia patient. EXAM: CT HEAD WITHOUT CONTRAST CT CERVICAL SPINE WITHOUT CONTRAST TECHNIQUE: Multidetector CT imaging of the  head and cervical spine was performed following the standard protocol without intravenous contrast. Multiplanar CT image reconstructions of the cervical spine were also generated. COMPARISON:  None. FINDINGS: CT HEAD FINDINGS Brain: Moderate generalized atrophy, slightly more pronounced in the temporal lobes. Mild to moderate chronic small vessel ischemia. No intracranial hemorrhage, mass effect, or midline shift. No hydrocephalus. The basilar cisterns are patent. No evidence of territorial infarct or acute ischemia. No extra-axial or intracranial fluid collection. Vascular: Atherosclerosis of skullbase vasculature without hyperdense vessel or abnormal calcification. Skull: No fracture or focal lesion. Sinuses/Orbits: No acute finding. Other: None. CT CERVICAL SPINE FINDINGS Alignment: Exaggerated upper thoracic kyphosis with straightening of normal cervical lordosis. No traumatic subluxation. Skull base and vertebrae: No acute fracture. Vertebral body heights are maintained. The dens and skull base are intact. Bones are osteopenic/osteoporotic. Soft tissues and spinal canal: No prevertebral fluid or swelling. No visible canal hematoma. Disc levels: Diffuse disc space narrowing and endplate spurring. Mild multilevel facet arthropathy. Upper chest: Biapical pleuroparenchymal scarring. Other: Carotid calcifications. IMPRESSION: 1.  No acute intracranial abnormality.  No skull fracture. 2. Generalized atrophy and chronic small vessel ischemia. 3. Multilevel degenerative change throughout the cervical spine without acute fracture or subluxation. 4. Carotid and intracranial atherosclerosis. Electronically Signed   By: Rubye Oaks M.D.   On: 07/05/2017 22:07   Ct Cervical Spine Wo Contrast  Result Date:  07/05/2017 CLINICAL DATA:  Fall today with neck pain.  Dementia patient. EXAM: CT HEAD WITHOUT CONTRAST CT CERVICAL SPINE WITHOUT CONTRAST TECHNIQUE: Multidetector CT imaging of the head and cervical spine was performed following the standard protocol without intravenous contrast. Multiplanar CT image reconstructions of the cervical spine were also generated. COMPARISON:  None. FINDINGS: CT HEAD FINDINGS Brain: Moderate generalized atrophy, slightly more pronounced in the temporal lobes. Mild to moderate chronic small vessel ischemia. No intracranial hemorrhage, mass effect, or midline shift. No hydrocephalus. The basilar cisterns are patent. No evidence of territorial infarct or acute ischemia. No extra-axial or intracranial fluid collection. Vascular: Atherosclerosis of skullbase vasculature without hyperdense vessel or abnormal calcification. Skull: No fracture or focal lesion. Sinuses/Orbits: No acute finding. Other: None. CT CERVICAL SPINE FINDINGS Alignment: Exaggerated upper thoracic kyphosis with straightening of normal cervical lordosis. No traumatic subluxation. Skull base and vertebrae: No acute fracture. Vertebral body heights are maintained. The dens and skull base are intact. Bones are osteopenic/osteoporotic. Soft tissues and spinal canal: No prevertebral fluid or swelling. No visible canal hematoma. Disc levels: Diffuse disc space narrowing and endplate spurring. Mild multilevel facet arthropathy. Upper chest: Biapical pleuroparenchymal scarring. Other: Carotid calcifications. IMPRESSION: 1.  No acute intracranial abnormality.  No skull fracture. 2. Generalized atrophy and chronic small vessel ischemia. 3. Multilevel degenerative change throughout the cervical spine without acute fracture or subluxation. 4. Carotid and intracranial atherosclerosis. Electronically Signed   By: Rubye Oaks M.D.   On: 07/05/2017 22:07   Dg Shoulder Left  Result Date: 07/05/2017 CLINICAL DATA:  Unwitnessed fall  today, deformity noted to superior left shoulder. EXAM: LEFT SHOULDER - 2+ VIEW COMPARISON:  None. FINDINGS: Displaced fracture of the distal left clavicle, marked superior elevation of the proximal fracture fragment. Left humeral head appears normally positioned relative to the glenoid fossa. Left upper ribs appear intact and normally aligned. IMPRESSION: Significantly displaced fracture of the distal left clavicle, with marked superior dislocation of the proximal fracture fragment. Electronically Signed   By: Bary Richard M.D.   On: 07/05/2017 21:45    Assessment/Plan: History of fall s/p fall,  left clavicle fx, scalp laceration, R+L hand abrasions. The patient fell 07/05/17 evening when she tried to get up in hallway.ED evaluation: CT head, cervical spine, X-ray chest and left shoulder done in ED. She was received Tdap, 3 staples on top of head for scalp laceration. She takes Nocor 5/325mg  1/2 tab bid for arthritic pain. Close supervision for safety. Update CBC CMP   Dementia Hx of dementia, resides in SNF, w/c for mobility, one person transfer, under Hospice service. Close supervision for safety.    Clavicle fracture s/p fall, left clavicle fx, scalp laceration, R+L hand abrasions. The patient fell 07/05/17 evening when she tried to get up in hallway.ED evaluation: CT head, cervical spine, X-ray chest and left shoulder done in ED. She was received Tdap, 3 staples on top of head for scalp laceration.  Continue Nocor 5/325mg  1/2 tab bid for pain. Sling left arm for support. F/u Ortho one week    Scalp laceration, subsequent encounter She was received Tdap, 3 staples on top of head for scalp laceration. No sign of infection, removal 7-10 days.    Hand abrasion R 4th finger, left 1st MCJ and thumb abrasion, mild swelling and bruise, PROM fingers with mild pain.     Family/ staff Communication: plan of care reviewed with the patient and charge nurse  Labs/tests ordered: CBC CMP  Time spend  25 minutes.

## 2017-07-06 NOTE — Assessment & Plan Note (Signed)
s/p fall, left clavicle fx, scalp laceration, R+L hand abrasions. The patient fell 07/05/17 evening when she tried to get up in hallway.ED evaluation: CT head, cervical spine, X-ray chest and left shoulder done in ED. She was received Tdap, 3 staples on top of head for scalp laceration. She takes Nocor 5/325mg  1/2 tab bid for arthritic pain. Close supervision for safety. Update CBC CMP

## 2017-07-08 LAB — CBC AND DIFFERENTIAL
HCT: 26 — AB (ref 36–46)
Hemoglobin: 8.9 — AB (ref 12.0–16.0)
PLATELETS: 177 (ref 150–399)
WBC: 8.4

## 2017-07-08 LAB — HEPATIC FUNCTION PANEL
ALT: 10 (ref 7–35)
AST: 18 (ref 13–35)
Alkaline Phosphatase: 88 (ref 25–125)
Bilirubin, Total: 0.5

## 2017-07-08 LAB — BASIC METABOLIC PANEL
BUN: 32 — AB (ref 4–21)
Creatinine: 0.8 (ref <=1.1)
GLUCOSE: 90
POTASSIUM: 4.4 (ref 3.4–5.3)
Sodium: 141 (ref 137–147)

## 2017-07-09 ENCOUNTER — Other Ambulatory Visit: Payer: Self-pay | Admitting: *Deleted

## 2017-07-12 ENCOUNTER — Non-Acute Institutional Stay (SKILLED_NURSING_FACILITY): Payer: Medicare Other | Admitting: Nurse Practitioner

## 2017-07-12 ENCOUNTER — Encounter: Payer: Self-pay | Admitting: Nurse Practitioner

## 2017-07-12 DIAGNOSIS — F329 Major depressive disorder, single episode, unspecified: Secondary | ICD-10-CM

## 2017-07-12 DIAGNOSIS — F0390 Unspecified dementia without behavioral disturbance: Secondary | ICD-10-CM | POA: Diagnosis not present

## 2017-07-12 DIAGNOSIS — D638 Anemia in other chronic diseases classified elsewhere: Secondary | ICD-10-CM

## 2017-07-12 DIAGNOSIS — I25700 Atherosclerosis of coronary artery bypass graft(s), unspecified, with unstable angina pectoris: Secondary | ICD-10-CM | POA: Diagnosis not present

## 2017-07-12 DIAGNOSIS — S42012D Anterior displaced fracture of sternal end of left clavicle, subsequent encounter for fracture with routine healing: Secondary | ICD-10-CM | POA: Diagnosis not present

## 2017-07-12 DIAGNOSIS — F0393 Unspecified dementia, unspecified severity, with mood disturbance: Secondary | ICD-10-CM

## 2017-07-12 DIAGNOSIS — K5901 Slow transit constipation: Secondary | ICD-10-CM | POA: Diagnosis not present

## 2017-07-12 DIAGNOSIS — F0280 Dementia in other diseases classified elsewhere without behavioral disturbance: Secondary | ICD-10-CM

## 2017-07-12 LAB — IRON,TIBC AND FERRITIN PANEL
%SAT: 23
Ferritin: 346
TIBC: 53

## 2017-07-12 LAB — VITAMIN B12: VITAMIN B 12: 494

## 2017-07-12 LAB — CBC AND DIFFERENTIAL
HEMATOCRIT: 29 — AB (ref 36–46)
Hemoglobin: 9.8 — AB (ref 12.0–16.0)
PLATELETS: 219 (ref 150–399)
WBC: 8.2

## 2017-07-12 NOTE — Assessment & Plan Note (Signed)
No constipation, continue SEnokot S I qhs, MiraLax daily.

## 2017-07-12 NOTE — Assessment & Plan Note (Signed)
Left clavicle fx, in sling. She takes Norco 5/325 1/2 bid.

## 2017-07-12 NOTE — Assessment & Plan Note (Signed)
Her mood is stable, continue Sertraline 50mg qd 

## 2017-07-12 NOTE — Assessment & Plan Note (Signed)
dementia, [facility], wheelchair to get around, incontinent of urine, under Hospice service for comfort measures. On Namenda for memory.

## 2017-07-12 NOTE — Assessment & Plan Note (Signed)
No angina since last visited, continue Isosorbide 5mg  bid.

## 2017-07-12 NOTE — Progress Notes (Signed)
Location:  [facility] Room Number: 40-A Place of Service:  SNF (31) Provider:  Omid Deardorff, Manxie  NP  Oneal Grout, MD  Patient Care Team: Oneal Grout, MD as PCP - General (Internal Medicine) [place] Rupinder Livingston X, NP as Nurse Practitioner (Nurse Practitioner)  Extended Emergency Contact Information Primary Emergency Contact: [facility] Address: [address] [place]          [name]  [address] Mobile Phone: [phone] Relation: Daughter Secondary Emergency Contact: [facility] Address: [address] [place], [place] [address] [place] Home Phone: [phone] Relation: Son  Code Status:  DNR Goals of care: Advanced Directive information Advanced Directives 07/05/2017  Does Patient Have a Medical Advance Directive? Yes  Type of Advance Directive Out of facility DNR (pink MOST or yellow form)  Does patient want to make changes to medical advance directive? No - Patient declined  Copy of Healthcare Power of Attorney in Chart? -  Pre-existing out of facility DNR order (yellow form or pink MOST form) Pink MOST form placed in chart (order not valid for inpatient use);Yellow form placed in chart (order not valid for inpatient use)     Chief Complaint  Patient presents with  . Medical Management of Chronic Issues    F/U - dementia , depression    HPI:  Pt is a 84 y.o. female seen today for medical management of chronic diseases.     The patient has history of dementia, resides in SNF [facility], wheelchair to get around, incontinent of urine, under Hospice service for comfort measures. Left clavicle fx, in sling. On Namenda for memory. Her mood is stable on Sertraline 50mg  qd. No constipation while on SEnokot S I qhs, MiraLax daily. No angina since last visited, on Isosorbide 5mg  bid. She takes Norco 5/325 1/2 bid. Anemia, Hgb 8.9 07/08/17, pending anemia panel.  Past Medical History:  Diagnosis Date  . Adjustment disorder  with depressed mood   . Anemia   . Asthma   . Constipation   . Coronary atherosclerosis of native coronary artery   . Dementia in conditions classified elsewhere without behavioral disturbance   . Depression   . DJD (degenerative joint disease)   . Edema   . GERD (gastroesophageal reflux disease)   . History of fall   . Hyperlipidemia   . Hypertension   . Insomnia   . Osteoporosis   . Pain in joint, pelvic region and thigh    left  . Right bundle branch block   . Unspecified constipation   . Unspecified urinary incontinence   . Urinary tract infection, site not specified    Past Surgical History:  Procedure Laterality Date  . ABDOMINAL HYSTERECTOMY    . CHOLECYSTECTOMY    . FEMUR IM NAIL  06/19/2011   Procedure: INTRAMEDULLARY (IM) NAIL FEMORAL;  Surgeon: Harvie Junior, MD;  Location: [facility] OR;  Service: Orthopedics;  Laterality: Left;  . SKIN CANCER EXCISION  02/15/2013   right forehead Dr. Irene Limbo    Allergies  Allergen Reactions  . Alendronate Sodium Other (See Comments)    Reaction:  Unknown   . Asmanex 120 Metered Doses [Mometasone Furoate] Other (See Comments)    Reaction:  Unknown   . Budesonide-Formoterol Fumarate Other (See Comments)    Reaction:  Unknown   . Fluticasone Propionate (Inhal) Other (See Comments)    Reaction:  Unknown   . Mometasone Furoate Other (See Comments)  Reaction:  Unknown   . Omeprazole Other (See Comments)    Reaction:  Unknown   . Valium [Diazepam] Other (See Comments)    Reaction:  Unknown     Outpatient Encounter Medications as of 07/12/2017  Medication Sig  . acetaminophen (TYLENOL) 325 MG tablet Take 650 mg by mouth every 6 (six) hours as needed for mild pain or moderate pain.  Marland Kitchen. aspirin 81 MG chewable tablet Chew 81 mg by mouth daily.  . feeding supplement (BOOST / RESOURCE BREEZE) LIQD Take 1 Container by mouth 3 (three) times daily between meals.   . fluorouracil (EFUDEX) 5 % cream Apply 1 application topically 2 (two) times  daily.  Marland Kitchen. HYDROcodone-acetaminophen (NORCO/VICODIN) 5-325 MG tablet Take 0.5 tablets by mouth 2 (two) times daily.   . isosorbide dinitrate (ISORDIL) 10 MG tablet Take 5 mg by mouth 2 (two) times daily.   . Lidocaine (ASPERCREME LIDOCAINE) 4 % PTCH Apply 1 patch topically.  . memantine (NAMENDA) 10 MG tablet Take 10 mg by mouth 2 (two) times daily.  . polyethylene glycol (MIRALAX / GLYCOLAX) packet Take 17 g by mouth daily.  Marland Kitchen. selenium sulfide (SELSUN) 1 % LOTN Apply 1 application topically. Shampoo on bath days every Monday and Thursday leave on 10-15 minutes, then rinse.  . sennosides-docusate sodium (SENOKOT-S) 8.6-50 MG tablet Take 1 tablet by mouth at bedtime.   . sertraline (ZOLOFT) 50 MG tablet Take 50 mg by mouth daily.    No facility-administered encounter medications on file as of 07/12/2017.    ROS was provided with assistance of staff Review of Systems  Constitutional: Negative for appetite change, chills, diaphoresis, fatigue and fever.  HENT: Positive for hearing loss. Negative for congestion, trouble swallowing and voice change.   Respiratory: Negative for cough, choking, shortness of breath and wheezing.   Cardiovascular: Negative for chest pain, palpitations and leg swelling.  Gastrointestinal: Negative for abdominal distention, abdominal pain, constipation, diarrhea, nausea and vomiting.  Genitourinary: Negative for difficulty urinating, dysuria and urgency.       Incontinent of urine.   Musculoskeletal: Positive for arthralgias and gait problem.       Non longer ambulating  Skin: Positive for wound.       Top of head laceration is healed. Left clavicle fracture, left arm in sling.   Neurological: Negative for dizziness, speech difficulty, weakness and headaches.  Psychiatric/Behavioral: Positive for confusion. Negative for agitation, behavioral problems, hallucinations and sleep disturbance. The patient is not nervous/anxious.     Immunization History  Administered  Date(s) Administered  . Influenza Whole 02/24/2012, 02/23/2013  . Influenza-Unspecified 03/04/2014, 01/08/2015, 02/04/2016, 02/09/2017  . PPD Test 06/12/2014  . Pneumococcal Polysaccharide-23 04/20/2008  . Td 04/21/2003  . Tdap 07/05/2017   Pertinent  Health Maintenance Due  Topic Date Due  . PNA vac Low Risk Adult (2 of 2 - PCV13) 04/20/2009  . INFLUENZA VACCINE  Completed  . DEXA SCAN  Completed   Fall Risk  12/24/2016 03/11/2015  Falls in the past year? No No  Risk for fall due to : - Impaired balance/gait   Functional Status Survey:    Vitals:   07/12/17 1245  BP: 136/72  Pulse: 78  Resp: 20  Temp: 97.8 F (36.6 C)  SpO2: 91%  Weight: 98 lb 11.2 oz (44.8 kg)  Height: 5\' 2"  (1.575 m)   Body mass index is 18.05 kg/m. Physical Exam  Constitutional: She appears well-developed and well-nourished.  HENT:  Head: Normocephalic and atraumatic.  Eyes: Pupils  are equal, round, and reactive to light. Conjunctivae and EOM are normal. Right eye exhibits no discharge. Left eye exhibits no discharge.  Neck: Normal range of motion. Neck supple. No JVD present. No thyromegaly present.  Cardiovascular: Normal rate and regular rhythm.  No murmur heard. Pulmonary/Chest: Effort normal and breath sounds normal. She has no wheezes. She has no rales.  Abdominal: Soft. Bowel sounds are normal. She exhibits no distension. There is no tenderness.  Musculoskeletal: She exhibits tenderness. She exhibits no edema.  One person transfer, wheelchair to get around. Pain in left clavicle area when left arm moves. Left hip region pain with left leg movement.   Neurological: She is alert. She exhibits normal muscle tone. Coordination normal.  Oriented to self.   Skin: Skin is warm and dry. No rash noted. No erythema.  Top of head laceration is healed. Left clavicle fracture, left arm in sling.   Psychiatric: She has a normal mood and affect. Her behavior is normal.    Labs reviewed: Recent Labs     09/01/16 03/23/17 07/08/17  NA 141 142 141  K 4.2 4.1 4.4  BUN 26* 27* 32*  CREATININE 1.0 0.8 0.8   Recent Labs    09/01/16 03/23/17 07/08/17  AST 21 22 18   ALT 11 14 10   ALKPHOS 65 85 88   Recent Labs    03/23/17 07/08/17 07/12/17  WBC 7.5 8.4 8.2  HGB 11.4* 8.9* 9.8*  HCT 33* 26* 29*  PLT 206 177 219   Lab Results  Component Value Date   TSH 2.42 09/01/2016   No results found for: HGBA1C Lab Results  Component Value Date   CHOL 131 01/09/2016   HDL 54 01/09/2016   LDLCALC 50 01/09/2016   TRIG 135 01/09/2016    Significant Diagnostic Results in last 30 days:  Dg Chest 2 View  Result Date: 07/05/2017 CLINICAL DATA:  Unwitnessed fall today, deformity to left shoulder. EXAM: CHEST - 2 VIEW COMPARISON:  Chest x-ray dated 06/19/2011. FINDINGS: Heart size is upper normal. Overall cardiomediastinal silhouette is within normal limits. Atherosclerotic changes noted at the aortic arch. Lungs are clear.  No pleural effusion or pneumothorax seen. Markedly displaced fracture of the distal left clavicle. No other osseous fracture or displacement seen. Ankylosis of the kyphotic thoracic spine. IMPRESSION: 1. No active cardiopulmonary disease. No evidence of pneumonia or pulmonary edema. 2. Markedly displaced fracture of the distal left clavicle, with marked superior displacement of the proximal fracture fragment. 3. Aortic atherosclerosis. Electronically Signed   By: Bary Richard M.D.   On: 07/05/2017 21:46   Ct Head Wo Contrast  Result Date: 07/05/2017 CLINICAL DATA:  Fall today with neck pain.  Dementia patient. EXAM: CT HEAD WITHOUT CONTRAST CT CERVICAL SPINE WITHOUT CONTRAST TECHNIQUE: Multidetector CT imaging of the head and cervical spine was performed following the standard protocol without intravenous contrast. Multiplanar CT image reconstructions of the cervical spine were also generated. COMPARISON:  None. FINDINGS: CT HEAD FINDINGS Brain: Moderate generalized atrophy, slightly  more pronounced in the temporal lobes. Mild to moderate chronic small vessel ischemia. No intracranial hemorrhage, mass effect, or midline shift. No hydrocephalus. The basilar cisterns are patent. No evidence of territorial infarct or acute ischemia. No extra-axial or intracranial fluid collection. Vascular: Atherosclerosis of skullbase vasculature without hyperdense vessel or abnormal calcification. Skull: No fracture or focal lesion. Sinuses/Orbits: No acute finding. Other: None. CT CERVICAL SPINE FINDINGS Alignment: Exaggerated upper thoracic kyphosis with straightening of normal cervical lordosis. No traumatic subluxation.  Skull base and vertebrae: No acute fracture. Vertebral body heights are maintained. The dens and skull base are intact. Bones are osteopenic/osteoporotic. Soft tissues and spinal canal: No prevertebral fluid or swelling. No visible canal hematoma. Disc levels: Diffuse disc space narrowing and endplate spurring. Mild multilevel facet arthropathy. Upper chest: Biapical pleuroparenchymal scarring. Other: Carotid calcifications. IMPRESSION: 1.  No acute intracranial abnormality.  No skull fracture. 2. Generalized atrophy and chronic small vessel ischemia. 3. Multilevel degenerative change throughout the cervical spine without acute fracture or subluxation. 4. Carotid and intracranial atherosclerosis. Electronically Signed   By: Rubye Oaks M.D.   On: 07/05/2017 22:07   Ct Cervical Spine Wo Contrast  Result Date: 07/05/2017 CLINICAL DATA:  Fall today with neck pain.  Dementia patient. EXAM: CT HEAD WITHOUT CONTRAST CT CERVICAL SPINE WITHOUT CONTRAST TECHNIQUE: Multidetector CT imaging of the head and cervical spine was performed following the standard protocol without intravenous contrast. Multiplanar CT image reconstructions of the cervical spine were also generated. COMPARISON:  None. FINDINGS: CT HEAD FINDINGS Brain: Moderate generalized atrophy, slightly more pronounced in the temporal  lobes. Mild to moderate chronic small vessel ischemia. No intracranial hemorrhage, mass effect, or midline shift. No hydrocephalus. The basilar cisterns are patent. No evidence of territorial infarct or acute ischemia. No extra-axial or intracranial fluid collection. Vascular: Atherosclerosis of skullbase vasculature without hyperdense vessel or abnormal calcification. Skull: No fracture or focal lesion. Sinuses/Orbits: No acute finding. Other: None. CT CERVICAL SPINE FINDINGS Alignment: Exaggerated upper thoracic kyphosis with straightening of normal cervical lordosis. No traumatic subluxation. Skull base and vertebrae: No acute fracture. Vertebral body heights are maintained. The dens and skull base are intact. Bones are osteopenic/osteoporotic. Soft tissues and spinal canal: No prevertebral fluid or swelling. No visible canal hematoma. Disc levels: Diffuse disc space narrowing and endplate spurring. Mild multilevel facet arthropathy. Upper chest: Biapical pleuroparenchymal scarring. Other: Carotid calcifications. IMPRESSION: 1.  No acute intracranial abnormality.  No skull fracture. 2. Generalized atrophy and chronic small vessel ischemia. 3. Multilevel degenerative change throughout the cervical spine without acute fracture or subluxation. 4. Carotid and intracranial atherosclerosis. Electronically Signed   By: Rubye Oaks M.D.   On: 07/05/2017 22:07   Dg Shoulder Left  Result Date: 07/05/2017 CLINICAL DATA:  Unwitnessed fall today, deformity noted to superior left shoulder. EXAM: LEFT SHOULDER - 2+ VIEW COMPARISON:  None. FINDINGS: Displaced fracture of the distal left clavicle, marked superior elevation of the proximal fracture fragment. Left humeral head appears normally positioned relative to the glenoid fossa. Left upper ribs appear intact and normally aligned. IMPRESSION: Significantly displaced fracture of the distal left clavicle, with marked superior dislocation of the proximal fracture  fragment. Electronically Signed   By: Bary Richard M.D.   On: 07/05/2017 21:45    Assessment/Plan Dementia dementia, resides in SNF [facility], wheelchair to get around, incontinent of urine, under Hospice service for comfort measures. On Namenda for memory.  Clavicle fracture  Left clavicle fx, in sling. She takes Norco 5/325 1/2 bid.    Depression due to dementia Her mood is stable, continue Sertraline 50mg  qd.   Constipation No constipation, continue SEnokot S I qhs, MiraLax daily.   CAD (coronary artery disease) of artery bypass graft No angina since last visited, continue Isosorbide 5mg  bid.   Anemia of chronic disease 07/08/17 wbc 8.4, Hgb 8.9, plt 177, Na 141, K 4.5, Bun 32, creat 0.75, TP 5.2, albumin 3.2 07/09/17 f/u wbc, TIBC, Fe, Fe Sat, Ferritin, FOBT x3, Vit B12, Folate, Retic count  07/12/17      Family/ staff Communication: plan of care reviewed with the patient and charge nurse. Prevnar 13 prescription written.   Labs/tests ordered: pending f/u wbc, TIBC, Fe, Fe Sat, Ferritin, FOBT x3, Vit B12, Folate, Retic count 07/12/17  Time spend 25 minutes.

## 2017-07-12 NOTE — Assessment & Plan Note (Signed)
07/08/17 wbc 8.4, Hgb 8.9, plt 177, Na 141, K 4.5, Bun 32, creat 0.75, TP 5.2, albumin 3.2 07/09/17 f/u wbc, TIBC, Fe, Fe Sat, Ferritin, FOBT x3, Vit B12, Folate, Retic count 07/12/17

## 2017-07-13 ENCOUNTER — Other Ambulatory Visit: Payer: Self-pay | Admitting: *Deleted

## 2017-07-13 ENCOUNTER — Encounter: Payer: Self-pay | Admitting: *Deleted

## 2017-07-13 LAB — FOLATE: FOLATE: 16.2

## 2017-07-21 ENCOUNTER — Non-Acute Institutional Stay (SKILLED_NURSING_FACILITY): Payer: Medicare Other | Admitting: Nurse Practitioner

## 2017-07-21 ENCOUNTER — Encounter: Payer: Self-pay | Admitting: Nurse Practitioner

## 2017-07-21 DIAGNOSIS — F0150 Vascular dementia without behavioral disturbance: Secondary | ICD-10-CM

## 2017-07-21 DIAGNOSIS — Z7189 Other specified counseling: Secondary | ICD-10-CM

## 2017-07-21 NOTE — Assessment & Plan Note (Signed)
Goals of care discussion: reviewed goals of care with the patient, the patient's POA daughter. Social worker present. DNR form reviewed. Went over and filled out MOST for between 3:00pm to 3:20pm. The patient would like to continue to be DNR, when the patient has no pulse and is not breathing. She desires do not transfer to hospital unless comfort needs cannot be met in current location. She agrees limitation of antibiotics when infections occurs. No IV fluids or feeding tube. Form signed by the patient's representative, social worker, and myself. Copies made for chart and family.

## 2017-07-21 NOTE — Assessment & Plan Note (Signed)
The patient has advanced dementia, [facility], one person transfer, wheelchair to get around, fall risk due to her lack of safety awareness and increased frailty. She has left clavicle fracture and scalp laceration as resultant of the last fall. Continue Memantine for memory, close supervision for safety.

## 2017-07-21 NOTE — Progress Notes (Signed)
Location:  [facility] Room Number: 40-A Place of Service:  SNF (31) Provider:  Arav Bannister, Manxie  NP  Blanchie Serve, MD  Patient Care Team: Blanchie Serve, MD as PCP - General (Internal Medicine) [place] Hajer Dwyer X, NP as Nurse Practitioner (Nurse Practitioner)  Extended Emergency Contact Information Primary Emergency Contact: [facility] Address: [address] [place] Phone: [phone] Relation: Daughter Secondary Emergency Contact: Waymon Amato Address: [address] [place], [place] [address] Johnnette Litter of [place] Phone: [phone] Relation: Son  Code Status:  DNR Goals of care: Advanced Directive information Advanced Directives 07/05/2017  Does Patient Have a Medical Advance Directive? Yes  Type of Advance Directive Out of facility DNR (pink MOST or yellow form)  Does patient want to make changes to medical advance directive? No - Patient declined  Copy of [place] in Chart? -  Pre-existing out of facility DNR order (yellow form or pink MOST form) Pink MOST form placed in chart (order not valid for inpatient use);Yellow form placed in chart (order not valid for inpatient use)     Chief Complaint  Patient presents with  . Acute Visit    ACP Meeting    HPI:  Pt is a 84 y.o. female seen today for an acute visit for goals of care discussion. The patient has advanced dementia, resides in SNF [facility], one person transfer, wheelchair to get around, fall risk due to her lack of safety awareness and increased frailty. She has left clavicle fracture and scalp laceration as resultant of the last fall. She takes Memantine for memory.    Past Medical History:  Diagnosis Date  . Adjustment disorder with depressed mood   . Anemia   . Asthma   . Constipation   . Coronary atherosclerosis of native coronary artery   . Dementia in conditions classified elsewhere without behavioral  disturbance   . Depression   . DJD (degenerative joint disease)   . Edema   . GERD (gastroesophageal reflux disease)   . History of fall   . Hyperlipidemia   . Hypertension   . Insomnia   . Osteoporosis   . Pain in joint, pelvic region and thigh    left  . Right bundle branch block   . Unspecified constipation   . Unspecified urinary incontinence   . Urinary tract infection, site not specified    Past Surgical History:  Procedure Laterality Date  . ABDOMINAL HYSTERECTOMY    . CHOLECYSTECTOMY    . FEMUR IM NAIL  06/19/2011   Procedure: INTRAMEDULLARY (IM) NAIL FEMORAL;  Surgeon: Alta Corning, MD;  Location: [place];  Service: Orthopedics;  Laterality: Left;  . SKIN CANCER EXCISION  02/15/2013   right forehead Dr. Sarajane Jews    Allergies  Allergen Reactions  . Alendronate Sodium Other (See Comments)    Reaction:  Unknown   . Asmanex 120 Metered Doses [Mometasone Furoate] Other (See Comments)    Reaction:  Unknown   . Budesonide-Formoterol Fumarate Other (See Comments)    Reaction:  Unknown   . Fluticasone Propionate (Inhal) Other (See Comments)    Reaction:  Unknown   . Mometasone Furoate Other (See Comments)    Reaction:  Unknown   . Omeprazole Other (See Comments)    Reaction:  Unknown   . Valium [Diazepam] Other (See Comments)  Reaction:  Unknown     Outpatient Encounter Medications as of 07/21/2017  Medication Sig  . acetaminophen (TYLENOL) 325 MG tablet Take 650 mg by mouth every 6 (six) hours as needed for mild pain or moderate pain.  Marland Kitchen aspirin 81 MG chewable tablet Chew 81 mg by mouth daily.  . feeding supplement (BOOST / RESOURCE BREEZE) LIQD Take 1 Container by mouth 3 (three) times daily between meals.   Marland Kitchen HYDROcodone-acetaminophen (NORCO/VICODIN) 5-325 MG tablet Take 0.5 tablets by mouth 2 (two) times daily.   . isosorbide dinitrate (ISORDIL) 10 MG tablet Take 5 mg by mouth 2 (two) times daily.   . Lidocaine (ASPERCREME LIDOCAINE) 4 % PTCH Apply 1 patch  topically.  . memantine (NAMENDA) 10 MG tablet Take 10 mg by mouth 2 (two) times daily.  . polyethylene glycol (MIRALAX / GLYCOLAX) packet Take 17 g by mouth daily.  Marland Kitchen selenium sulfide (SELSUN) 1 % LOTN Apply 1 application topically. Shampoo on bath days every Monday and Thursday leave on 10-15 minutes, then rinse.  . sennosides-docusate sodium (SENOKOT-S) 8.6-50 MG tablet Take 1 tablet by mouth at bedtime.   . sertraline (ZOLOFT) 50 MG tablet Take 50 mg by mouth daily.   . [DISCONTINUED] fluorouracil (EFUDEX) 5 % cream Apply 1 application topically 2 (two) times daily.   No facility-administered encounter medications on file as of 07/21/2017.    ROS was provided with assistance of staff Review of Systems  Constitutional: Negative for activity change, appetite change, chills, diaphoresis, fatigue and fever.  HENT: Positive for hearing loss.   Respiratory: Negative for cough, shortness of breath and wheezing.   Cardiovascular: Negative for chest pain and leg swelling.  Musculoskeletal: Positive for arthralgias and gait problem.  Skin: Negative for color change and pallor.  Neurological: Negative for speech difficulty, weakness and headaches.       Dementia.   Psychiatric/Behavioral: Positive for behavioral problems and confusion. Negative for agitation, hallucinations and sleep disturbance. The patient is not nervous/anxious.     Immunization History  Administered Date(s) Administered  . Influenza Whole 02/24/2012, 02/23/2013  . Influenza-Unspecified 03/04/2014, 01/08/2015, 02/04/2016, 02/09/2017  . PPD Test 06/12/2014  . Pneumococcal Polysaccharide-23 04/20/2008  . Td 04/21/2003  . Tdap 07/05/2017   Pertinent  Health Maintenance Due  Topic Date Due  . PNA vac Low Risk Adult (2 of 2 - PCV13) 04/20/2009  . INFLUENZA VACCINE  11/18/2017  . DEXA SCAN  Completed   Fall Risk  12/24/2016 03/11/2015  Falls in the past year? No No  Risk for fall due to : - Impaired balance/gait    Functional Status Survey:    Vitals:   07/21/17 1458  BP: 122/62  Pulse: 72  Resp: 18  Temp: 98 F (36.7 C)  SpO2: 91%  Weight: 98 lb 11.2 oz (44.8 kg)  Height: 5' 2"  (1.575 m)   Body mass index is 18.05 kg/m. Physical Exam  Constitutional: She appears well-developed and well-nourished. No distress.  HENT:  Head: Normocephalic and atraumatic.  Eyes: Pupils are equal, round, and reactive to light. Conjunctivae and EOM are normal.  Neck: Normal range of motion. Neck supple.  Cardiovascular: Normal rate and regular rhythm.  No murmur heard. Pulmonary/Chest: Effort normal and breath sounds normal. She has no wheezes. She has no rales.  Musculoskeletal: She exhibits tenderness and deformity.  Deformed left clavicle from recent fracture.   Neurological: She is alert. She exhibits normal muscle tone. Coordination normal.  Oriented to self   Skin: Skin is  warm and dry. She is not diaphoretic.  Psychiatric: She has a normal mood and affect.    Labs reviewed: Recent Labs    09/01/16 03/23/17 07/08/17  NA 141 142 141  K 4.2 4.1 4.4  BUN 26* 27* 32*  CREATININE 1.0 0.8 0.8   Recent Labs    09/01/16 03/23/17 07/08/17  AST 21 22 18   ALT 11 14 10   ALKPHOS 65 85 88   Recent Labs    03/23/17 07/08/17 07/12/17  WBC 7.5 8.4 8.2  HGB 11.4* 8.9* 9.8*  HCT 33* 26* 29*  PLT 206 177 219   Lab Results  Component Value Date   TSH 2.42 09/01/2016   No results found for: HGBA1C Lab Results  Component Value Date   CHOL 131 01/09/2016   HDL 54 01/09/2016   LDLCALC 50 01/09/2016   TRIG 135 01/09/2016    Significant Diagnostic Results in last 30 days:  Dg Chest 2 View  Result Date: 07/05/2017 CLINICAL DATA:  Unwitnessed fall today, deformity to left shoulder. EXAM: CHEST - 2 VIEW COMPARISON:  Chest x-ray dated 06/19/2011. FINDINGS: Heart size is upper normal. Overall cardiomediastinal silhouette is within normal limits. Atherosclerotic changes noted at the aortic arch. Lungs  are clear.  No pleural effusion or pneumothorax seen. Markedly displaced fracture of the distal left clavicle. No other osseous fracture or displacement seen. Ankylosis of the kyphotic thoracic spine. IMPRESSION: 1. No active cardiopulmonary disease. No evidence of pneumonia or pulmonary edema. 2. Markedly displaced fracture of the distal left clavicle, with marked superior displacement of the proximal fracture fragment. 3. Aortic atherosclerosis. Electronically Signed   By: Franki Cabot M.D.   On: 07/05/2017 21:46   Ct Head Wo Contrast  Result Date: 07/05/2017 CLINICAL DATA:  Fall today with neck pain.  Dementia patient. EXAM: CT HEAD WITHOUT CONTRAST CT CERVICAL SPINE WITHOUT CONTRAST TECHNIQUE: Multidetector CT imaging of the head and cervical spine was performed following the standard protocol without intravenous contrast. Multiplanar CT image reconstructions of the cervical spine were also generated. COMPARISON:  None. FINDINGS: CT HEAD FINDINGS Brain: Moderate generalized atrophy, slightly more pronounced in the temporal lobes. Mild to moderate chronic small vessel ischemia. No intracranial hemorrhage, mass effect, or midline shift. No hydrocephalus. The basilar cisterns are patent. No evidence of territorial infarct or acute ischemia. No extra-axial or intracranial fluid collection. Vascular: Atherosclerosis of skullbase vasculature without hyperdense vessel or abnormal calcification. Skull: No fracture or focal lesion. Sinuses/Orbits: No acute finding. Other: None. CT CERVICAL SPINE FINDINGS Alignment: Exaggerated upper thoracic kyphosis with straightening of normal cervical lordosis. No traumatic subluxation. Skull base and vertebrae: No acute fracture. Vertebral body heights are maintained. The dens and skull base are intact. Bones are osteopenic/osteoporotic. Soft tissues and spinal canal: No prevertebral fluid or swelling. No visible canal hematoma. Disc levels: Diffuse disc space narrowing and  endplate spurring. Mild multilevel facet arthropathy. Upper chest: Biapical pleuroparenchymal scarring. Other: Carotid calcifications. IMPRESSION: 1.  No acute intracranial abnormality.  No skull fracture. 2. Generalized atrophy and chronic small vessel ischemia. 3. Multilevel degenerative change throughout the cervical spine without acute fracture or subluxation. 4. Carotid and intracranial atherosclerosis. Electronically Signed   By: Jeb Levering M.D.   On: 07/05/2017 22:07   Ct Cervical Spine Wo Contrast  Result Date: 07/05/2017 CLINICAL DATA:  Fall today with neck pain.  Dementia patient. EXAM: CT HEAD WITHOUT CONTRAST CT CERVICAL SPINE WITHOUT CONTRAST TECHNIQUE: Multidetector CT imaging of the head and cervical spine was performed following  the standard protocol without intravenous contrast. Multiplanar CT image reconstructions of the cervical spine were also generated. COMPARISON:  None. FINDINGS: CT HEAD FINDINGS Brain: Moderate generalized atrophy, slightly more pronounced in the temporal lobes. Mild to moderate chronic small vessel ischemia. No intracranial hemorrhage, mass effect, or midline shift. No hydrocephalus. The basilar cisterns are patent. No evidence of territorial infarct or acute ischemia. No extra-axial or intracranial fluid collection. Vascular: Atherosclerosis of skullbase vasculature without hyperdense vessel or abnormal calcification. Skull: No fracture or focal lesion. Sinuses/Orbits: No acute finding. Other: None. CT CERVICAL SPINE FINDINGS Alignment: Exaggerated upper thoracic kyphosis with straightening of normal cervical lordosis. No traumatic subluxation. Skull base and vertebrae: No acute fracture. Vertebral body heights are maintained. The dens and skull base are intact. Bones are osteopenic/osteoporotic. Soft tissues and spinal canal: No prevertebral fluid or swelling. No visible canal hematoma. Disc levels: Diffuse disc space narrowing and endplate spurring. Mild  multilevel facet arthropathy. Upper chest: Biapical pleuroparenchymal scarring. Other: Carotid calcifications. IMPRESSION: 1.  No acute intracranial abnormality.  No skull fracture. 2. Generalized atrophy and chronic small vessel ischemia. 3. Multilevel degenerative change throughout the cervical spine without acute fracture or subluxation. 4. Carotid and intracranial atherosclerosis. Electronically Signed   By: Jeb Levering M.D.   On: 07/05/2017 22:07   Dg Shoulder Left  Result Date: 07/05/2017 CLINICAL DATA:  Unwitnessed fall today, deformity noted to superior left shoulder. EXAM: LEFT SHOULDER - 2+ VIEW COMPARISON:  None. FINDINGS: Displaced fracture of the distal left clavicle, marked superior elevation of the proximal fracture fragment. Left humeral head appears normally positioned relative to the glenoid fossa. Left upper ribs appear intact and normally aligned. IMPRESSION: Significantly displaced fracture of the distal left clavicle, with marked superior dislocation of the proximal fracture fragment. Electronically Signed   By: Franki Cabot M.D.   On: 07/05/2017 21:45    Assessment/Plan Advanced care planning/counseling discussion Goals of care discussion: reviewed goals of care with the patient, the patient's POA daughter. Social worker present. DNR form reviewed. Went over and filled out MOST for between 3:00pm to 3:20pm. The patient would like to continue to be DNR, when the patient has no pulse and is not breathing. She desires do not transfer to hospital unless comfort needs cannot be met in current location. She agrees limitation of antibiotics when infections occurs. No IV fluids or feeding tube. Form signed by the patient's representative, social worker, and myself. Copies made for chart and family.   Dementia The patient has advanced dementia, resides in SNF [facility], one person transfer, wheelchair to get around, fall risk due to her lack of safety awareness and increased frailty. She  has left clavicle fracture and scalp laceration as resultant of the last fall. Continue Memantine for memory, close supervision for safety.       Family/ staff Communication: plan of care reviewed with the patient, the patient's POA daughter, Education officer, museum, and Camera operator.    Labs/tests ordered: none  Time spend 25 minutes.

## 2017-08-03 ENCOUNTER — Non-Acute Institutional Stay (SKILLED_NURSING_FACILITY): Payer: Medicare Other | Admitting: Nurse Practitioner

## 2017-08-03 ENCOUNTER — Encounter: Payer: Self-pay | Admitting: Nurse Practitioner

## 2017-08-03 DIAGNOSIS — F329 Major depressive disorder, single episode, unspecified: Secondary | ICD-10-CM

## 2017-08-03 DIAGNOSIS — M47817 Spondylosis without myelopathy or radiculopathy, lumbosacral region: Secondary | ICD-10-CM

## 2017-08-03 DIAGNOSIS — F0280 Dementia in other diseases classified elsewhere without behavioral disturbance: Secondary | ICD-10-CM

## 2017-08-03 DIAGNOSIS — F0150 Vascular dementia without behavioral disturbance: Secondary | ICD-10-CM

## 2017-08-03 DIAGNOSIS — Z9181 History of falling: Secondary | ICD-10-CM | POA: Diagnosis not present

## 2017-08-03 DIAGNOSIS — F0393 Unspecified dementia, unspecified severity, with mood disturbance: Secondary | ICD-10-CM

## 2017-08-03 NOTE — Assessment & Plan Note (Signed)
fell 08/02/17 backwards in w/c when she attempted to stand up, no apparent injury, poor safety awareness and increased frailty are contributory to her falling, continue close supervision for safety.

## 2017-08-03 NOTE — Assessment & Plan Note (Addendum)
HPI was provided with assistance of staff due to her advanced dementia. Continue Memantine 10mg  bid po.

## 2017-08-03 NOTE — Progress Notes (Signed)
Location:  [facility] Room Number: 40 Place of Service:  SNF (31) Provider:  Shamieka Gullo, Manxie  NP  Oneal Grout, MD  Patient Care Team: Oneal Grout, MD as PCP - General (Internal Medicine) [place] Kanae Ignatowski X, NP as Nurse Practitioner (Nurse Practitioner)  Extended Emergency Contact Information Primary Emergency Contact: [facility] Address: [address] [place]          [name]  [address] Mobile Phone: [phone] Relation: Daughter Secondary Emergency Contact: Memory Dance Address: [address] [place], [place] [address] [place] of [place] Home Phone: [phone] Relation: Son  Code Status:  DNR Goals of care: Advanced Directive information Advanced Directives 08/03/2017  Does Patient Have a Medical Advance Directive? Yes  Type of Advance Directive Living will;Out of facility DNR (pink MOST or yellow form)  Does patient want to make changes to medical advance directive? No - Patient declined  Copy of Healthcare Power of Attorney in Chart? -  Pre-existing out of facility DNR order (yellow form or pink MOST form) -     Chief Complaint  Patient presents with  . Acute Visit    Fall,    HPI:  Pt is a 84 y.o. female seen today for an acute visit for fell 08/02/17 backwards in w/c when she attempted to stand up, no apparent injury, HPI was provided with assistance of staff due to her advanced dementia. She denied pain, right collar bone is protruding to as prior. She takes Norco 5/325mg  1/2 tab bid for arthritic pain. Her mood is stable on Wellbutrin 75mg  bid since 07/20/17 in addition to Sertraline 50mg  qd.    Past Medical History:  Diagnosis Date  . Adjustment disorder with depressed mood   . Anemia   . Asthma   . Constipation   . Coronary atherosclerosis of native coronary artery   . Dementia in conditions classified elsewhere without behavioral disturbance   . Depression   . DJD (degenerative joint disease)   .  Edema   . GERD (gastroesophageal reflux disease)   . History of fall   . Hyperlipidemia   . Hypertension   . Insomnia   . Osteoporosis   . Pain in joint, pelvic region and thigh    left  . Right bundle branch block   . Unspecified constipation   . Unspecified urinary incontinence   . Urinary tract infection, site not specified    Past Surgical History:  Procedure Laterality Date  . ABDOMINAL HYSTERECTOMY    . CHOLECYSTECTOMY    . FEMUR IM NAIL  06/19/2011   Procedure: INTRAMEDULLARY (IM) NAIL FEMORAL;  Surgeon: Harvie Junior, MD;  Location: [facility] OR;  Service: Orthopedics;  Laterality: Left;  . SKIN CANCER EXCISION  02/15/2013   right forehead Dr. Irene Limbo    Allergies  Allergen Reactions  . Alendronate Sodium Other (See Comments)    Reaction:  Unknown   . Asmanex 120 Metered Doses [Mometasone Furoate] Other (See Comments)    Reaction:  Unknown   . Budesonide-Formoterol Fumarate Other (See Comments)    Reaction:  Unknown   . Fluticasone Propionate (Inhal) Other (See Comments)    Reaction:  Unknown   . Mometasone Furoate Other (See Comments)    Reaction:  Unknown   . Omeprazole Other (See Comments)    Reaction:  Unknown   . Valium [Diazepam] Other (See Comments)    Reaction:  Unknown     Outpatient Encounter  Medications as of 08/03/2017  Medication Sig  . acetaminophen (TYLENOL) 325 MG tablet Take 650 mg by mouth every 6 (six) hours as needed for mild pain or moderate pain.  Marland Kitchen. aspirin 81 MG chewable tablet Chew 81 mg by mouth daily.  Marland Kitchen. buPROPion (WELLBUTRIN) 75 MG tablet Take 75 mg by mouth 2 (two) times daily.  . feeding supplement (BOOST / RESOURCE BREEZE) LIQD Take 1 Container by mouth 3 (three) times daily between meals.   Marland Kitchen. HYDROcodone-acetaminophen (NORCO/VICODIN) 5-325 MG tablet Take 0.5 tablets by mouth 2 (two) times daily.   . isosorbide dinitrate (ISORDIL) 10 MG tablet Take 5 mg by mouth 2 (two) times daily.   . Lidocaine (ASPERCREME LIDOCAINE) 4 % PTCH Apply 1  patch topically.  . memantine (NAMENDA) 10 MG tablet Take 10 mg by mouth 2 (two) times daily.  . polyethylene glycol (MIRALAX / GLYCOLAX) packet Take 17 g by mouth daily.  Marland Kitchen. selenium sulfide (SELSUN) 1 % LOTN Apply 1 application topically. Shampoo on bath days every Monday and Thursday leave on 10-15 minutes, then rinse.  . sennosides-docusate sodium (SENOKOT-S) 8.6-50 MG tablet Take 1 tablet by mouth at bedtime.   . sertraline (ZOLOFT) 50 MG tablet Take 50 mg by mouth daily.    No facility-administered encounter medications on file as of 08/03/2017.    ROS was provided with assistance of staff Review of Systems  Constitutional: Negative for activity change, appetite change, chills, diaphoresis, fatigue and fever.  HENT: Positive for hearing loss. Negative for congestion and voice change.   Respiratory: Negative for cough and shortness of breath.   Gastrointestinal: Negative for abdominal distention, abdominal pain, constipation, diarrhea, nausea and vomiting.  Genitourinary: Negative for difficulty urinating, dysuria and urgency.       Incontinent of urine.   Musculoskeletal: Positive for gait problem.       Falling.   Skin: Positive for wound. Negative for color change and pallor.  Neurological: Negative for speech difficulty, weakness and headaches.       Dementia  Psychiatric/Behavioral: Positive for confusion. Negative for agitation, behavioral problems, hallucinations and sleep disturbance. The patient is not nervous/anxious.     Immunization History  Administered Date(s) Administered  . Influenza Whole 02/24/2012, 02/23/2013  . Influenza-Unspecified 03/04/2014, 01/08/2015, 02/04/2016, 02/09/2017  . PPD Test 06/12/2014  . Pneumococcal Polysaccharide-23 04/20/2008  . Td 04/21/2003  . Tdap 07/05/2017   Pertinent  Health Maintenance Due  Topic Date Due  . PNA vac Low Risk Adult (2 of 2 - PCV13) 04/20/2009  . INFLUENZA VACCINE  11/18/2017  . DEXA SCAN  Completed   Fall Risk   12/24/2016 03/11/2015  Falls in the past year? No No  Risk for fall due to : - Impaired balance/gait   Functional Status Survey:    Vitals:   08/03/17 1421  BP: 130/60  Pulse: 66  Resp: 18  Temp: 98.4 F (36.9 C)  SpO2: 96%  Weight: 97 lb 4.8 oz (44.1 kg)  Height: 5\' 2"  (1.575 m)   Body mass index is 17.8 kg/m. Physical Exam  Constitutional: She appears well-developed and well-nourished.  HENT:  Head: Normocephalic and atraumatic.  Eyes: Pupils are equal, round, and reactive to light. EOM are normal.  Neck: Neck supple.  Cardiovascular: Normal rate and regular rhythm.  No murmur heard. Pulmonary/Chest: She has no wheezes. She has no rales.  Abdominal: Soft. Bowel sounds are normal.  Musculoskeletal: She exhibits no edema or tenderness.  Right clavicle is protruded at the distal end,  skin intact. One person transfer, w/c for mobility.   Neurological: She is alert. No cranial nerve deficit. She exhibits normal muscle tone. Coordination normal.  Oriented to self  Skin: Skin is warm and dry.  Psychiatric: She has a normal mood and affect.    Labs reviewed: Recent Labs    09/01/16 03/23/17 07/08/17  NA 141 142 141  K 4.2 4.1 4.4  BUN 26* 27* 32*  CREATININE 1.0 0.8 0.8   Recent Labs    09/01/16 03/23/17 07/08/17  AST 21 22 18   ALT 11 14 10   ALKPHOS 65 85 88   Recent Labs    03/23/17 07/08/17 07/12/17  WBC 7.5 8.4 8.2  HGB 11.4* 8.9* 9.8*  HCT 33* 26* 29*  PLT 206 177 219   Lab Results  Component Value Date   TSH 2.42 09/01/2016   No results found for: HGBA1C Lab Results  Component Value Date   CHOL 131 01/09/2016   HDL 54 01/09/2016   LDLCALC 50 01/09/2016   TRIG 135 01/09/2016    Significant Diagnostic Results in last 30 days:  Dg Chest 2 View  Result Date: 07/05/2017 CLINICAL DATA:  Unwitnessed fall today, deformity to left shoulder. EXAM: CHEST - 2 VIEW COMPARISON:  Chest x-ray dated 06/19/2011. FINDINGS: Heart size is upper normal. Overall  cardiomediastinal silhouette is within normal limits. Atherosclerotic changes noted at the aortic arch. Lungs are clear.  No pleural effusion or pneumothorax seen. Markedly displaced fracture of the distal left clavicle. No other osseous fracture or displacement seen. Ankylosis of the kyphotic thoracic spine. IMPRESSION: 1. No active cardiopulmonary disease. No evidence of pneumonia or pulmonary edema. 2. Markedly displaced fracture of the distal left clavicle, with marked superior displacement of the proximal fracture fragment. 3. Aortic atherosclerosis. Electronically Signed   By: Bary Richard M.D.   On: 07/05/2017 21:46   Ct Head Wo Contrast  Result Date: 07/05/2017 CLINICAL DATA:  Fall today with neck pain.  Dementia patient. EXAM: CT HEAD WITHOUT CONTRAST CT CERVICAL SPINE WITHOUT CONTRAST TECHNIQUE: Multidetector CT imaging of the head and cervical spine was performed following the standard protocol without intravenous contrast. Multiplanar CT image reconstructions of the cervical spine were also generated. COMPARISON:  None. FINDINGS: CT HEAD FINDINGS Brain: Moderate generalized atrophy, slightly more pronounced in the temporal lobes. Mild to moderate chronic small vessel ischemia. No intracranial hemorrhage, mass effect, or midline shift. No hydrocephalus. The basilar cisterns are patent. No evidence of territorial infarct or acute ischemia. No extra-axial or intracranial fluid collection. Vascular: Atherosclerosis of skullbase vasculature without hyperdense vessel or abnormal calcification. Skull: No fracture or focal lesion. Sinuses/Orbits: No acute finding. Other: None. CT CERVICAL SPINE FINDINGS Alignment: Exaggerated upper thoracic kyphosis with straightening of normal cervical lordosis. No traumatic subluxation. Skull base and vertebrae: No acute fracture. Vertebral body heights are maintained. The dens and skull base are intact. Bones are osteopenic/osteoporotic. Soft tissues and spinal canal: No  prevertebral fluid or swelling. No visible canal hematoma. Disc levels: Diffuse disc space narrowing and endplate spurring. Mild multilevel facet arthropathy. Upper chest: Biapical pleuroparenchymal scarring. Other: Carotid calcifications. IMPRESSION: 1.  No acute intracranial abnormality.  No skull fracture. 2. Generalized atrophy and chronic small vessel ischemia. 3. Multilevel degenerative change throughout the cervical spine without acute fracture or subluxation. 4. Carotid and intracranial atherosclerosis. Electronically Signed   By: Rubye Oaks M.D.   On: 07/05/2017 22:07   Ct Cervical Spine Wo Contrast  Result Date: 07/05/2017 CLINICAL DATA:  Fall today  with neck pain.  Dementia patient. EXAM: CT HEAD WITHOUT CONTRAST CT CERVICAL SPINE WITHOUT CONTRAST TECHNIQUE: Multidetector CT imaging of the head and cervical spine was performed following the standard protocol without intravenous contrast. Multiplanar CT image reconstructions of the cervical spine were also generated. COMPARISON:  None. FINDINGS: CT HEAD FINDINGS Brain: Moderate generalized atrophy, slightly more pronounced in the temporal lobes. Mild to moderate chronic small vessel ischemia. No intracranial hemorrhage, mass effect, or midline shift. No hydrocephalus. The basilar cisterns are patent. No evidence of territorial infarct or acute ischemia. No extra-axial or intracranial fluid collection. Vascular: Atherosclerosis of skullbase vasculature without hyperdense vessel or abnormal calcification. Skull: No fracture or focal lesion. Sinuses/Orbits: No acute finding. Other: None. CT CERVICAL SPINE FINDINGS Alignment: Exaggerated upper thoracic kyphosis with straightening of normal cervical lordosis. No traumatic subluxation. Skull base and vertebrae: No acute fracture. Vertebral body heights are maintained. The dens and skull base are intact. Bones are osteopenic/osteoporotic. Soft tissues and spinal canal: No prevertebral fluid or swelling.  No visible canal hematoma. Disc levels: Diffuse disc space narrowing and endplate spurring. Mild multilevel facet arthropathy. Upper chest: Biapical pleuroparenchymal scarring. Other: Carotid calcifications. IMPRESSION: 1.  No acute intracranial abnormality.  No skull fracture. 2. Generalized atrophy and chronic small vessel ischemia. 3. Multilevel degenerative change throughout the cervical spine without acute fracture or subluxation. 4. Carotid and intracranial atherosclerosis. Electronically Signed   By: Rubye Oaks M.D.   On: 07/05/2017 22:07   Dg Shoulder Left  Result Date: 07/05/2017 CLINICAL DATA:  Unwitnessed fall today, deformity noted to superior left shoulder. EXAM: LEFT SHOULDER - 2+ VIEW COMPARISON:  None. FINDINGS: Displaced fracture of the distal left clavicle, marked superior elevation of the proximal fracture fragment. Left humeral head appears normally positioned relative to the glenoid fossa. Left upper ribs appear intact and normally aligned. IMPRESSION: Significantly displaced fracture of the distal left clavicle, with marked superior dislocation of the proximal fracture fragment. Electronically Signed   By: Bary Richard M.D.   On: 07/05/2017 21:45    Assessment/Plan History of fall fell 08/02/17 backwards in w/c when she attempted to stand up, no apparent injury, poor safety awareness and increased frailty are contributory to her falling, continue close supervision for safety.   Dementia HPI was provided with assistance of staff due to her advanced dementia. Continue Memantine 10mg  bid po.   Depression due to dementia Her mood is stable on Wellbutrin 75mg  bid since 07/20/17. Continue Sertraline 50mg  qd.    Lumbosacral spondylosis without myelopathy She denied pain, right collar bone is protruding to as prior. Continue  Norco 5/325mg  1/2 tab bid for arthritic pain.     Family/ staff Communication:   Labs/tests ordered:  None  Time spend 25 minutes.

## 2017-08-03 NOTE — Assessment & Plan Note (Addendum)
Her mood is stable on Wellbutrin 75mg  bid since 07/20/17. Continue Sertraline 50mg  qd.

## 2017-08-03 NOTE — Assessment & Plan Note (Signed)
She denied pain, right collar bone is protruding to as prior. Continue  Norco 5/325mg  1/2 tab bid for arthritic pain.

## 2017-08-12 ENCOUNTER — Encounter: Payer: Self-pay | Admitting: Internal Medicine

## 2017-08-12 ENCOUNTER — Non-Acute Institutional Stay (SKILLED_NURSING_FACILITY): Payer: Medicare Other | Admitting: Internal Medicine

## 2017-08-12 DIAGNOSIS — I10 Essential (primary) hypertension: Secondary | ICD-10-CM

## 2017-08-12 DIAGNOSIS — K5909 Other constipation: Secondary | ICD-10-CM | POA: Diagnosis not present

## 2017-08-12 DIAGNOSIS — E43 Unspecified severe protein-calorie malnutrition: Secondary | ICD-10-CM | POA: Diagnosis not present

## 2017-08-12 DIAGNOSIS — F0151 Vascular dementia with behavioral disturbance: Secondary | ICD-10-CM

## 2017-08-12 DIAGNOSIS — R2681 Unsteadiness on feet: Secondary | ICD-10-CM

## 2017-08-12 DIAGNOSIS — F01518 Vascular dementia, unspecified severity, with other behavioral disturbance: Secondary | ICD-10-CM

## 2017-08-12 NOTE — Progress Notes (Signed)
Location:  [facility] Room Number: 40 Place of Service:    Provider:  Oneal Grout MD  Oneal Grout, MD  Patient Care Team: Oneal Grout, MD as PCP - General (Internal Medicine) [place] Mast, Man X, NP as Nurse Practitioner (Nurse Practitioner)  Extended Emergency Contact Information Primary Emergency Contact: [facility] Address: [address] [place]          [name]  [address] Mobile Phone: [phone] Relation: Daughter Secondary Emergency Contact: Memory Dance Address: [address] [place], [place] [address] [place] of [place] Home Phone: [phone] Relation: Son  Code Status:  DNR  Goals of care: Advanced Directive information Advanced Directives 08/03/2017  Does Patient Have a Medical Advance Directive? Yes  Type of Advance Directive Living will;Out of facility DNR (pink MOST or yellow form)  Does patient want to make changes to medical advance directive? No - Patient declined  Copy of Healthcare Power of Attorney in Chart? -  Pre-existing out of facility DNR order (yellow form or pink MOST form) -     Chief Complaint  Patient presents with  . Medical Management of Chronic Issues    F/U- Dementia, depression , history of falls,    HPI:  Pt is a 84 y.o. female seen today for medical management of chronic diseases. She has advanced dementia and does not participate in HPI and ROS. No acute concern from nursing.   Dementia with behavioral disturbance- fewer attempts to get out of her wheelchair unassisted in morning shifts per nurse these days. She does have occasional crying out episodes. She is under total care. She is a high fall risk and had a fall a week back. Currently on wellbutrin 75 mg bid and sertraline 50 mg daily with memantine 10 mg bid  Unsteady gait- wheelchair bound, high fall risk, needs 1 person maximum assistance with her ADLs. Fall on 08/02/17.   Hypertension- takes isosorbide dinitrate 5  mg bid and baby aspirin  Chronic constipation- taking miralax 17 g daily and senokot s daily  Severe protein calorie malnutrition- has to be fed, takes nutritional supplement    Past Medical History:  Diagnosis Date  . Adjustment disorder with depressed mood   . Anemia   . Asthma   . Constipation   . Coronary atherosclerosis of native coronary artery   . Dementia in conditions classified elsewhere without behavioral disturbance   . Depression   . DJD (degenerative joint disease)   . Edema   . GERD (gastroesophageal reflux disease)   . History of fall   . Hyperlipidemia   . Hypertension   . Insomnia   . Osteoporosis   . Pain in joint, pelvic region and thigh    left  . Right bundle branch block   . Unspecified constipation   . Unspecified urinary incontinence   . Urinary tract infection, site not specified    Past Surgical History:  Procedure Laterality Date  . ABDOMINAL HYSTERECTOMY    . CHOLECYSTECTOMY    . FEMUR IM NAIL  06/19/2011   Procedure: INTRAMEDULLARY (IM) NAIL FEMORAL;  Surgeon: Harvie Junior, MD;  Location: [facility] OR;  Service: Orthopedics;  Laterality: Left;  . SKIN CANCER EXCISION  02/15/2013   right forehead Dr. Irene Limbo    Allergies  Allergen Reactions  . Alendronate Sodium Other (See Comments)    Reaction:  Unknown   . Asmanex 120 Metered Doses [Mometasone Furoate] Other (See Comments)  Reaction:  Unknown   . Budesonide-Formoterol Fumarate Other (See Comments)    Reaction:  Unknown   . Fluticasone Propionate (Inhal) Other (See Comments)    Reaction:  Unknown   . Mometasone Furoate Other (See Comments)    Reaction:  Unknown   . Omeprazole Other (See Comments)    Reaction:  Unknown   . Valium [Diazepam] Other (See Comments)    Reaction:  Unknown     Outpatient Encounter Medications as of 08/12/2017  Medication Sig  . acetaminophen (TYLENOL) 325 MG tablet Take 650 mg by mouth every 6 (six) hours as needed for mild pain or moderate pain.  Marland Kitchen  aspirin 81 MG chewable tablet Chew 81 mg by mouth daily.  Marland Kitchen buPROPion (WELLBUTRIN) 75 MG tablet Take 75 mg by mouth 2 (two) times daily.  . feeding supplement (BOOST / RESOURCE BREEZE) LIQD Take 1 Container by mouth 3 (three) times daily between meals.   . fluorouracil (EFUDEX) 5 % cream Apply topically 2 (two) times daily. APPLY THIN LAYER TO RIGHT AND LEFT VERTEX SCALP WITH Q-TIP TWICE A DAY FOR 2 WEEKS. STOP IF SEVERE PAIN OR INFLAMMATION  . HYDROcodone-acetaminophen (NORCO/VICODIN) 5-325 MG tablet Take 0.5 tablets by mouth 2 (two) times daily.   . isosorbide dinitrate (ISORDIL) 10 MG tablet Take 5 mg by mouth 2 (two) times daily.   . Lidocaine (ASPERCREME LIDOCAINE) 4 % PTCH Apply 1 patch topically.  . memantine (NAMENDA) 10 MG tablet Take 10 mg by mouth 2 (two) times daily.  . polyethylene glycol (MIRALAX / GLYCOLAX) packet Take 17 g by mouth daily.  Marland Kitchen selenium sulfide (SELSUN) 1 % LOTN Apply 1 application topically. Shampoo on bath days every Monday and Thursday leave on 10-15 minutes, then rinse.  . sennosides-docusate sodium (SENOKOT-S) 8.6-50 MG tablet Take 1 tablet by mouth at bedtime.   . sertraline (ZOLOFT) 50 MG tablet Take 50 mg by mouth daily.    No facility-administered encounter medications on file as of 08/12/2017.     Review of Systems  Unable to perform ROS: Dementia    Immunization History  Administered Date(s) Administered  . Influenza Whole 02/24/2012, 02/23/2013  . Influenza-Unspecified 03/04/2014, 01/08/2015, 02/04/2016, 02/09/2017  . PPD Test 06/12/2014  . Pneumococcal Polysaccharide-23 04/20/2008  . Td 04/21/2003  . Tdap 07/05/2017   Pertinent  Health Maintenance Due  Topic Date Due  . PNA vac Low Risk Adult (2 of 2 - PCV13) 04/20/2009  . INFLUENZA VACCINE  11/18/2017  . DEXA SCAN  Completed   Fall Risk  12/24/2016 03/11/2015  Falls in the past year? No No  Risk for fall due to : - Impaired balance/gait   Functional Status Survey:    Vitals:    08/12/17 1250  BP: 134/64  Pulse: 64  Resp: 18  Temp: 98.4 F (36.9 C)  Weight: 97 lb 4.8 oz (44.1 kg)  Height: 5\' 2"  (1.575 m)   Body mass index is 17.8 kg/m.   Wt Readings from Last 3 Encounters:  08/12/17 97 lb 4.8 oz (44.1 kg)  08/03/17 97 lb 4.8 oz (44.1 kg)  07/21/17 98 lb 11.2 oz (44.8 kg)   Physical Exam  Constitutional:  Thin built and frail, elderly female in no acute distress  HENT:  Head: Normocephalic and atraumatic.  Right Ear: External ear normal.  Left Ear: External ear normal.  Mouth/Throat: Oropharynx is clear and moist.  Eyes: Pupils are equal, round, and reactive to light. Conjunctivae and EOM are normal. Right eye exhibits no  discharge. Left eye exhibits no discharge.  Neck: Normal range of motion. Neck supple.  Cardiovascular: Normal rate and regular rhythm.  Pulmonary/Chest: Effort normal and breath sounds normal. No respiratory distress. She has no wheezes. She has no rales.  Abdominal: Soft. Bowel sounds are normal. She exhibits no mass. There is no tenderness.  Musculoskeletal: She exhibits no edema.  Able to move all 4 extremities, unsteady gait, 1 person assistance with transfer, wheelchair bound  Lymphadenopathy:    She has no cervical adenopathy.  Neurological:  Pleasantly confused  Skin: Skin is warm and dry. She is not diaphoretic. No erythema.    Labs reviewed: Recent Labs    09/01/16 03/23/17 07/08/17  NA 141 142 141  K 4.2 4.1 4.4  BUN 26* 27* 32*  CREATININE 1.0 0.8 0.8   Recent Labs    09/01/16 03/23/17 07/08/17  AST 21 22 18   ALT 11 14 10   ALKPHOS 65 85 88   Recent Labs    03/23/17 07/08/17 07/12/17  WBC 7.5 8.4 8.2  HGB 11.4* 8.9* 9.8*  HCT 33* 26* 29*  PLT 206 177 219   Lab Results  Component Value Date   TSH 2.42 09/01/2016   No results found for: HGBA1C Lab Results  Component Value Date   CHOL 131 01/09/2016   HDL 54 01/09/2016   LDLCALC 50 01/09/2016   TRIG 135 01/09/2016    Significant Diagnostic  Results in last 30 days:  No results found.  Assessment/Plan  1. Severe protein-calorie malnutrition (HCC) Weight low but stable. Continue nutritional supplement and encourage po intake  2. Essential hypertension, benign Continue isosorbide dinitrate 5 mg bid and monitor  3. Vascular dementia with behavior disturbance Continue memantine and sertraline with bupropion, decline anticipated  4. Unsteady gait Fall precautions, supportive care  5. Chronic constipation Continue miralax and senokot s, maintain hydration   Family/ staff Communication: reviewed care plan with patient's charge nurse.    Labs/tests ordered:  none   Oneal GroutMAHIMA Roxie Kreeger, MD Internal Medicine [facility] [address] [place], [place] [address] Cell Phone (Monday-Friday 8 am - 5 pm): [phone] On Call: [phone] and follow prompts after 5 pm and on weekends Office Phone: [phone] Office Fax: [phone]

## 2017-09-06 ENCOUNTER — Encounter: Payer: Self-pay | Admitting: Nurse Practitioner

## 2017-09-06 ENCOUNTER — Non-Acute Institutional Stay (SKILLED_NURSING_FACILITY): Payer: Medicare Other | Admitting: Nurse Practitioner

## 2017-09-06 DIAGNOSIS — F0393 Unspecified dementia, unspecified severity, with mood disturbance: Secondary | ICD-10-CM

## 2017-09-06 DIAGNOSIS — F329 Major depressive disorder, single episode, unspecified: Secondary | ICD-10-CM

## 2017-09-06 DIAGNOSIS — R627 Adult failure to thrive: Secondary | ICD-10-CM | POA: Diagnosis not present

## 2017-09-06 DIAGNOSIS — F0280 Dementia in other diseases classified elsewhere without behavioral disturbance: Secondary | ICD-10-CM | POA: Diagnosis not present

## 2017-09-06 DIAGNOSIS — F01518 Vascular dementia, unspecified severity, with other behavioral disturbance: Secondary | ICD-10-CM

## 2017-09-06 DIAGNOSIS — F0151 Vascular dementia with behavioral disturbance: Secondary | ICD-10-CM | POA: Diagnosis not present

## 2017-09-06 NOTE — Assessment & Plan Note (Signed)
Her mood is managed, continue Wellbutrin  bid(07/20/17 Hospice for crying episodes), Sertraline  qd.

## 2017-09-06 NOTE — Progress Notes (Signed)
Location:  [facility] Room Number: 40 Place of Service:  SNF (31) Provider:  Matheson Vandehei, ManXie  NP  Oneal Grout, MD  Patient Care Team: Oneal Grout, MD as PCP - General (Internal Medicine) [place] Aydrien Froman X, NP as Nurse Practitioner (Nurse Practitioner)  Extended Emergency Contact Information Primary Emergency Contact: [facility] Address: [address] [place]          [name]  [address] Mobile Phone: [phone] Relation: Daughter Secondary Emergency Contact: Memory Dance Address: [address] [place], [place] [address] [place] of [place] Home Phone: [phone] Relation: Son  Code Status:  DNR Goals of care: Advanced Directive information Advanced Directives 09/06/2017  Does Patient Have a Medical Advance Directive? Yes  Type of Advance Directive Living will;Out of facility DNR (pink MOST or yellow form)  Does patient want to make changes to medical advance directive? No - Patient declined  Copy of Healthcare Power of Attorney in Chart? -  Pre-existing out of facility DNR order (yellow form or pink MOST form) Yellow form placed in chart (order not valid for inpatient use)     Chief Complaint  Patient presents with  . Acute Visit    Medication change for wt. loss    HPI:  Pt is a 84 y.o. female seen today for an acute visit for gradual decline in weight, # 94Ibs 08/23/17, #95Ibs 08/19/17, #97Ibs 07/22/17, #99Ibs 06/18/17. On Resource  tid. She has history of dementia, near total care of ADL, resides in SNF [facility], on Memantine  bid for memory. Her mood is managed on Wellbutrin  bid(07/20/17 Hospice for crying episodes), Sertraline  qd.   Past Medical History:  Diagnosis Date  . Adjustment disorder with depressed mood   . Anemia   . Asthma   . Constipation   . Coronary atherosclerosis of native coronary artery   . Dementia in conditions classified elsewhere without behavioral disturbance   . Depression   .  DJD (degenerative joint disease)   . Edema   . GERD (gastroesophageal reflux disease)   . History of fall   . Hyperlipidemia   . Hypertension   . Insomnia   . Osteoporosis   . Pain in joint, pelvic region and thigh    left  . Right bundle branch block   . Unspecified constipation   . Unspecified urinary incontinence   . Urinary tract infection, site not specified    Past Surgical History:  Procedure Laterality Date  . ABDOMINAL HYSTERECTOMY    . CHOLECYSTECTOMY    . FEMUR IM NAIL  06/19/2011   Procedure: INTRAMEDULLARY (IM) NAIL FEMORAL;  Surgeon: Harvie Junior, MD;  Location: [facility] OR;  Service: Orthopedics;  Laterality: Left;  . SKIN CANCER EXCISION  02/15/2013   right forehead Dr. Irene Limbo    Allergies  Allergen Reactions  . Alendronate Sodium Other (See Comments)    Reaction:  Unknown   . Asmanex 120 Metered Doses [Mometasone Furoate] Other (See Comments)    Reaction:  Unknown   . Budesonide-Formoterol Fumarate Other (See Comments)    Reaction:  Unknown   . Fluticasone Propionate (Inhal) Other (See Comments)    Reaction:  Unknown   . Mometasone Furoate Other (See Comments)    Reaction:  Unknown   . Omeprazole Other (See Comments)    Reaction:  Unknown   . Valium [Diazepam] Other (See Comments)    Reaction:  Unknown  Outpatient Encounter Medications as of 09/06/2017  Medication Sig  . acetaminophen (TYLENOL) 325 MG tablet Take 650 mg by mouth every 6 (six) hours as needed for mild pain or moderate pain.  Marland Kitchen aspirin 81 MG chewable tablet Chew 81 mg by mouth daily.  Marland Kitchen buPROPion (WELLBUTRIN) 75 MG tablet Take 75 mg by mouth 2 (two) times daily.  . feeding supplement (BOOST / RESOURCE BREEZE) LIQD Take 1 Container by mouth 3 (three) times daily between meals. 180 ml 3 x day  . HYDROcodone-acetaminophen (NORCO/VICODIN) 5-325 MG tablet Take 0.5 tablets by mouth 2 (two) times daily.   . isosorbide dinitrate (ISORDIL) 10 MG tablet Take 5 mg by mouth 2 (two) times daily.   .  Lidocaine (ASPERCREME LIDOCAINE) 4 % PTCH Apply 1 patch topically.  . memantine (NAMENDA) 10 MG tablet Take 10 mg by mouth 2 (two) times daily.  . polyethylene glycol (MIRALAX / GLYCOLAX) packet Take 17 g by mouth daily.  Marland Kitchen selenium sulfide (SELSUN) 1 % LOTN Apply 1 application topically. Shampoo on bath days every Monday and Thursday leave on 10-15 minutes, then rinse.  . sennosides-docusate sodium (SENOKOT-S) 8.6-50 MG tablet Take 1 tablet by mouth at bedtime.   . sertraline (ZOLOFT) 50 MG tablet Take 50 mg by mouth daily.    No facility-administered encounter medications on file as of 09/06/2017.    ROS was provided with assistance of staff Review of Systems  Constitutional: Positive for appetite change and fatigue. Negative for activity change, chills and fever.       Weight loss  HENT: Positive for hearing loss. Negative for congestion.   Respiratory: Negative for cough and shortness of breath.   Cardiovascular: Negative for chest pain, palpitations and leg swelling.  Gastrointestinal: Negative for abdominal distention and abdominal pain.  Genitourinary: Negative for dysuria and urgency.       Incontinent of urine.   Musculoskeletal: Positive for gait problem.       W/c dependent.   Skin: Negative for color change and pallor.  Neurological: Negative for speech difficulty and weakness.       Dementia  Psychiatric/Behavioral: Positive for confusion. Negative for agitation, behavioral problems and sleep disturbance. The patient is not nervous/anxious.     Immunization History  Administered Date(s) Administered  . Influenza Whole 02/24/2012, 02/23/2013  . Influenza-Unspecified 03/04/2014, 01/08/2015, 02/04/2016, 02/09/2017  . PPD Test 06/12/2014  . Pneumococcal Polysaccharide-23 04/20/2008  . Td 04/21/2003  . Tdap 07/05/2017   Pertinent  Health Maintenance Due  Topic Date Due  . PNA vac Low Risk Adult (2 of 2 - PCV13) 04/20/2009  . INFLUENZA VACCINE  11/18/2017  . DEXA SCAN   Completed   Fall Risk  12/24/2016 03/11/2015  Falls in the past year? No No  Risk for fall due to : - Impaired balance/gait   Functional Status Survey:    Vitals:   09/06/17 1013  BP: 120/64  Pulse: 70  Resp: 20  Temp: (!) 97.2 F (36.2 C)  Weight: 93 lb 14.4 oz (42.6 kg)  Height:  (1.575 m)   Body mass index is 17.17 kg/m. Physical Exam  Constitutional: She appears well-developed and well-nourished.  Thin, frail.   Eyes: Pupils are equal, round, and reactive to light. EOM are normal.  Neck: Normal range of motion. Neck supple. No JVD present. No thyromegaly present.  Cardiovascular: Normal rate and regular rhythm.  No murmur heard. Pulmonary/Chest: Effort normal and breath sounds normal. She has no wheezes. She has no rales.  Abdominal: Soft. Bowel sounds are normal. She exhibits no distension. There is no tenderness.  Musculoskeletal: She exhibits no edema.  One person transfer, w/c for mobility  Neurological: She is alert. She exhibits normal muscle tone. Coordination normal.  Oriented to self.   Skin: Skin is warm and dry.  Psychiatric: She has a normal mood and affect.    Labs reviewed: Recent Labs    03/23/17 07/08/17  NA 142 141  K 4.1 4.4  BUN 27* 32*  CREATININE 0.8 0.8   Recent Labs    03/23/17 07/08/17  AST 22 18  ALT 14 10  ALKPHOS 85 88   Recent Labs    03/23/17 07/08/17 07/12/17  WBC 7.5 8.4 8.2  HGB 11.4* 8.9* 9.8*  HCT 33* 26* 29*  PLT 206 177 219   Lab Results  Component Value Date   TSH 2.42 09/01/2016   No results found for: HGBA1C Lab Results  Component Value Date   CHOL 131 01/09/2016   HDL 54 01/09/2016   LDLCALC 50 01/09/2016   TRIG 135 01/09/2016    Significant Diagnostic Results in last 30 days:  No results found.  Assessment/Plan Adult failure to thrive Expected gradual decline in weight, # 94Ibs 08/23/17, #95Ibs 08/19/17, #97Ibs 07/22/17, #99Ibs 06/18/17. On Resource  tid. Will empirical Mirtazapine 7.5mg  qhs,  weight weekly, update CBC CMP, continue Hospice service.     Dementia She has history of dementia, near total care of ADL, resides in SNF [facility], on Memantine  bid for memory. Expected to decline.   Depression due to dementia Her mood is managed, continue Wellbutrin  bid(07/20/17 Hospice for crying episodes), Sertraline  qd.     Family/ staff Communication: plan of care reviewed with the patient and charge nurse.   Labs/tests ordered:  CBC CMP  Time spend 25 minutes.

## 2017-09-06 NOTE — Assessment & Plan Note (Signed)
Expected gradual decline in weight, # 94Ibs 08/23/17, #95Ibs 08/19/17, #97Ibs 07/22/17, #99Ibs 06/18/17. On Resource  tid. Will empirical Mirtazapine 7.5mg  qhs, weight weekly, update CBC CMP, continue Hospice service.

## 2017-09-06 NOTE — Assessment & Plan Note (Signed)
She has history of dementia, near total care of ADL, resides in SNF [facility], on Memantine  bid for memory. Expected to decline.

## 2017-09-17 ENCOUNTER — Non-Acute Institutional Stay (SKILLED_NURSING_FACILITY): Payer: Medicare Other | Admitting: Nurse Practitioner

## 2017-09-17 ENCOUNTER — Encounter: Payer: Self-pay | Admitting: Nurse Practitioner

## 2017-09-17 DIAGNOSIS — M150 Primary generalized (osteo)arthritis: Secondary | ICD-10-CM

## 2017-09-17 DIAGNOSIS — M159 Polyosteoarthritis, unspecified: Secondary | ICD-10-CM

## 2017-09-17 DIAGNOSIS — F0151 Vascular dementia with behavioral disturbance: Secondary | ICD-10-CM

## 2017-09-17 DIAGNOSIS — I10 Essential (primary) hypertension: Secondary | ICD-10-CM

## 2017-09-17 DIAGNOSIS — I25700 Atherosclerosis of coronary artery bypass graft(s), unspecified, with unstable angina pectoris: Secondary | ICD-10-CM

## 2017-09-17 DIAGNOSIS — F01518 Vascular dementia, unspecified severity, with other behavioral disturbance: Secondary | ICD-10-CM

## 2017-09-17 DIAGNOSIS — F329 Major depressive disorder, single episode, unspecified: Secondary | ICD-10-CM

## 2017-09-17 DIAGNOSIS — F32A Depression, unspecified: Secondary | ICD-10-CM

## 2017-09-17 NOTE — Assessment & Plan Note (Signed)
HTN, controlled, continue  Isosorbide  bid.

## 2017-09-17 NOTE — Assessment & Plan Note (Signed)
Her mood is stable, continu eSertraline  qd, Bupropion  bid.

## 2017-09-17 NOTE — Assessment & Plan Note (Signed)
Stable, no angina since last visited, continue Isosorbide and ASA

## 2017-09-17 NOTE — Assessment & Plan Note (Signed)
resides in SNF [facility], total care of ADLs except sometime she feeds herself, incontinent of B+B, lacking of safety awareness, risk falling, dementia, on Memantine, one person transfer, w/c for mobility.

## 2017-09-17 NOTE — Assessment & Plan Note (Signed)
Multiple OA pain is managed, continue Norco 1/2 bid. Prn Tylenol

## 2017-09-17 NOTE — Progress Notes (Signed)
Location:  [facility] Room Number: 40 Place of Service:  SNF (31) Provider:  Kayliegh Boyers, ManXie  NP  Oneal Grout, MD  Patient Care Team: Oneal Grout, MD as PCP - General (Internal Medicine) [place] Morgon Pamer X, NP as Nurse Practitioner (Nurse Practitioner)  Extended Emergency Contact Information Primary Emergency Contact: [facility] Address: [address] [place]          [name]  [address] Mobile Phone: [phone] Relation: Daughter Secondary Emergency Contact: Memory Dance Address: [address] [place], [place] [address] [place] of [place] Home Phone: [phone] Relation: Son  Code Status:  DNR Goals of care: Advanced Directive information Advanced Directives 09/06/2017  Does Patient Have a Medical Advance Directive? Yes  Type of Advance Directive Living will;Out of facility DNR (pink MOST or yellow form)  Does patient want to make changes to medical advance directive? No - Patient declined  Copy of Healthcare Power of Attorney in Chart? -  Pre-existing out of facility DNR order (yellow form or pink MOST form) Yellow form placed in chart (order not valid for inpatient use)     Chief Complaint  Patient presents with  . Medical Management of Chronic Issues    Dementia, HTN, CAD, depression    HPI:  Pt is a 84 y.o. female seen today for medical management of chronic diseases.      The patient resides in SNF [facility], total care of ADLs except sometime she feeds herself, incontinent of B+B, lacking of safety awareness, risk falling, dementia, on Memantine, one person transfer, w/c for mobility. Her mood is stable on Sertraline  qd, Bupropion  bid. Multiple OA pain is managed with Norco 1/2 bid. HTN, controlled on Isosorbide  bid.   Past Medical History:  Diagnosis Date  . Adjustment disorder with depressed mood   . Anemia   . Asthma   . Constipation   . Coronary atherosclerosis of native coronary artery   .  Dementia in conditions classified elsewhere without behavioral disturbance   . Depression   . DJD (degenerative joint disease)   . Edema   . GERD (gastroesophageal reflux disease)   . History of fall   . Hyperlipidemia   . Hypertension   . Insomnia   . Osteoporosis   . Pain in joint, pelvic region and thigh    left  . Right bundle branch block   . Unspecified constipation   . Unspecified urinary incontinence   . Urinary tract infection, site not specified    Past Surgical History:  Procedure Laterality Date  . ABDOMINAL HYSTERECTOMY    . CHOLECYSTECTOMY    . FEMUR IM NAIL  06/19/2011   Procedure: INTRAMEDULLARY (IM) NAIL FEMORAL;  Surgeon: Harvie Junior, MD;  Location: [facility] OR;  Service: Orthopedics;  Laterality: Left;  . SKIN CANCER EXCISION  02/15/2013   right forehead Dr. Irene Limbo    Allergies  Allergen Reactions  . Alendronate Sodium Other (See Comments)    Reaction:  Unknown   . Asmanex 120 Metered Doses [Mometasone Furoate] Other (See Comments)    Reaction:  Unknown   . Budesonide-Formoterol Fumarate Other (See Comments)    Reaction:  Unknown   . Fluticasone Propionate (Inhal) Other (See Comments)    Reaction:  Unknown   . Mometasone Furoate Other (See Comments)    Reaction:  Unknown   . Omeprazole Other (See Comments)    Reaction:  Unknown   .  Valium [Diazepam] Other (See Comments)    Reaction:  Unknown     Outpatient Encounter Medications as of 09/17/2017  Medication Sig  . acetaminophen (TYLENOL) 325 MG tablet Take 650 mg by mouth every 6 (six) hours as needed for mild pain or moderate pain.  Marland Kitchen aspirin 81 MG chewable tablet Chew 81 mg by mouth daily.  Marland Kitchen buPROPion (WELLBUTRIN) 75 MG tablet Take 75 mg by mouth 2 (two) times daily.  Marland Kitchen HYDROcodone-acetaminophen (NORCO/VICODIN) 5-325 MG tablet Take 0.5 tablets by mouth 2 (two) times daily.   . isosorbide dinitrate (ISORDIL) 10 MG tablet Take 5 mg by mouth 2 (two) times daily.   . Lidocaine (ASPERCREME LIDOCAINE) 4 %  PTCH Apply 1 patch topically.  . memantine (NAMENDA) 10 MG tablet Take 10 mg by mouth 2 (two) times daily.  . mirtazapine (REMERON) 7.5 MG tablet Take 7.5 mg by mouth at bedtime.  . polyethylene glycol (MIRALAX / GLYCOLAX) packet Take 17 g by mouth daily.  Marland Kitchen selenium sulfide (SELSUN) 1 % LOTN Apply 1 application topically. Shampoo on bath days every Monday and Thursday leave on 10-15 minutes, then rinse.  . sennosides-docusate sodium (SENOKOT-S) 8.6-50 MG tablet Take 1 tablet by mouth at bedtime.   . sertraline (ZOLOFT) 50 MG tablet Take 50 mg by mouth daily.   . [DISCONTINUED] feeding supplement (BOOST / RESOURCE BREEZE) LIQD Take 1 Container by mouth 3 (three) times daily between meals. 180 ml 3 x day   No facility-administered encounter medications on file as of 09/17/2017.    ROS was provided with assistance of staff Review of Systems  Constitutional: Negative for activity change, appetite change, chills, diaphoresis, fatigue and fever.  HENT: Positive for hearing loss. Negative for congestion.   Respiratory: Negative for cough, shortness of breath and wheezing.   Cardiovascular: Negative for chest pain, palpitations and leg swelling.  Gastrointestinal: Negative for abdominal distention, abdominal pain, constipation, diarrhea, nausea and vomiting.       Fecal incontinence.   Genitourinary: Negative for difficulty urinating, dysuria and urgency.       Incontinent of urine .  Musculoskeletal: Positive for arthralgias and gait problem.       Non ambulatory.   Neurological: Positive for numbness. Negative for speech difficulty, weakness and headaches.       Dementia.   Psychiatric/Behavioral: Positive for confusion. Negative for agitation, behavioral problems, hallucinations and sleep disturbance. The patient is not nervous/anxious.     Immunization History  Administered Date(s) Administered  . Influenza Whole 02/24/2012, 02/23/2013  . Influenza-Unspecified 03/04/2014, 01/08/2015,  02/04/2016, 02/09/2017  . PPD Test 06/12/2014  . Pneumococcal Polysaccharide-23 04/20/2008  . Td 04/21/2003  . Tdap 07/05/2017   Pertinent  Health Maintenance Due  Topic Date Due  . PNA vac Low Risk Adult (2 of 2 - PCV13) 04/20/2009  . INFLUENZA VACCINE  11/18/2017  . DEXA SCAN  Completed   Fall Risk  12/24/2016 03/11/2015  Falls in the past year? No No  Risk for fall due to : - Impaired balance/gait   Functional Status Survey:    Vitals:   09/17/17 1259  BP: 124/88  Pulse: 68  Resp: 20  Temp: (!) 97.2 F (36.2 C)  Weight: 93 lb 14.4 oz (42.6 kg)  Height:  (1.575 m)   Body mass index is 17.17 kg/m. Physical Exam  Constitutional: She appears well-developed and well-nourished.  HENT:  Head: Normocephalic and atraumatic.  Eyes: Pupils are equal, round, and reactive to light. EOM are normal.  Neck: Normal range of motion. Neck supple. No JVD present. No thyromegaly present.  Cardiovascular: Normal rate and regular rhythm.  No murmur heard. Pulmonary/Chest: Effort normal and breath sounds normal. She has no wheezes. She has no rales.  Abdominal: Soft. Bowel sounds are normal.  Musculoskeletal: She exhibits no edema or tenderness.  One person transfer, w/c dependent.   Neurological: She is alert. No cranial nerve deficit. She exhibits normal muscle tone. Coordination normal.  Oriented to self.   Skin: Skin is warm and dry.  Psychiatric: She has a normal mood and affect.    Labs reviewed: Recent Labs    03/23/17 07/08/17  NA 142 141  K 4.1 4.4  BUN 27* 32*  CREATININE 0.8 0.8   Recent Labs    03/23/17 07/08/17  AST 22 18  ALT 14 10  ALKPHOS 85 88   Recent Labs    03/23/17 07/08/17 07/12/17  WBC 7.5 8.4 8.2  HGB 11.4* 8.9* 9.8*  HCT 33* 26* 29*  PLT 206 177 219   Lab Results  Component Value Date   TSH 2.42 09/01/2016   No results found for: HGBA1C Lab Results  Component Value Date   CHOL 131 01/09/2016   HDL 54 01/09/2016   LDLCALC 50  01/09/2016   TRIG 135 01/09/2016    Significant Diagnostic Results in last 30 days:  No results found.  Assessment/Plan Essential hypertension, benign HTN, controlled, continue  Isosorbide  bid.   CAD (coronary artery disease) of artery bypass graft Stable, no angina since last visited, continue Isosorbide and ASA  Dementia resides in SNF [facility], total care of ADLs except sometime she feeds herself, incontinent of B+B, lacking of safety awareness, risk falling, dementia, on Memantine, one person transfer, w/c for mobility.   Chronic depression Her mood is stable, continu eSertraline  qd, Bupropion  bid.   DJD (degenerative joint disease) Multiple OA pain is managed, continue Norco 1/2 bid. Prn Tylenol      Family/ staff Communication: plan of care reviewed with the patient and charge nurse. Prevnar 13 prescription provided today.   Labs/tests ordered:  None  Time spend 25 minutes.

## 2017-09-20 ENCOUNTER — Encounter: Payer: Self-pay | Admitting: Nurse Practitioner

## 2017-10-18 DEATH — deceased

## 2018-07-21 IMAGING — CR DG SHOULDER 2+V*L*
3 series · 3 of 3 positions shown · non-contrast
Comparison: None.

CLINICAL DATA: Unwitnessed fall today, deformity noted to superior
left shoulder.

EXAM:
LEFT SHOULDER - 2+ VIEW

[x shoulder ap left (1 of 3)]
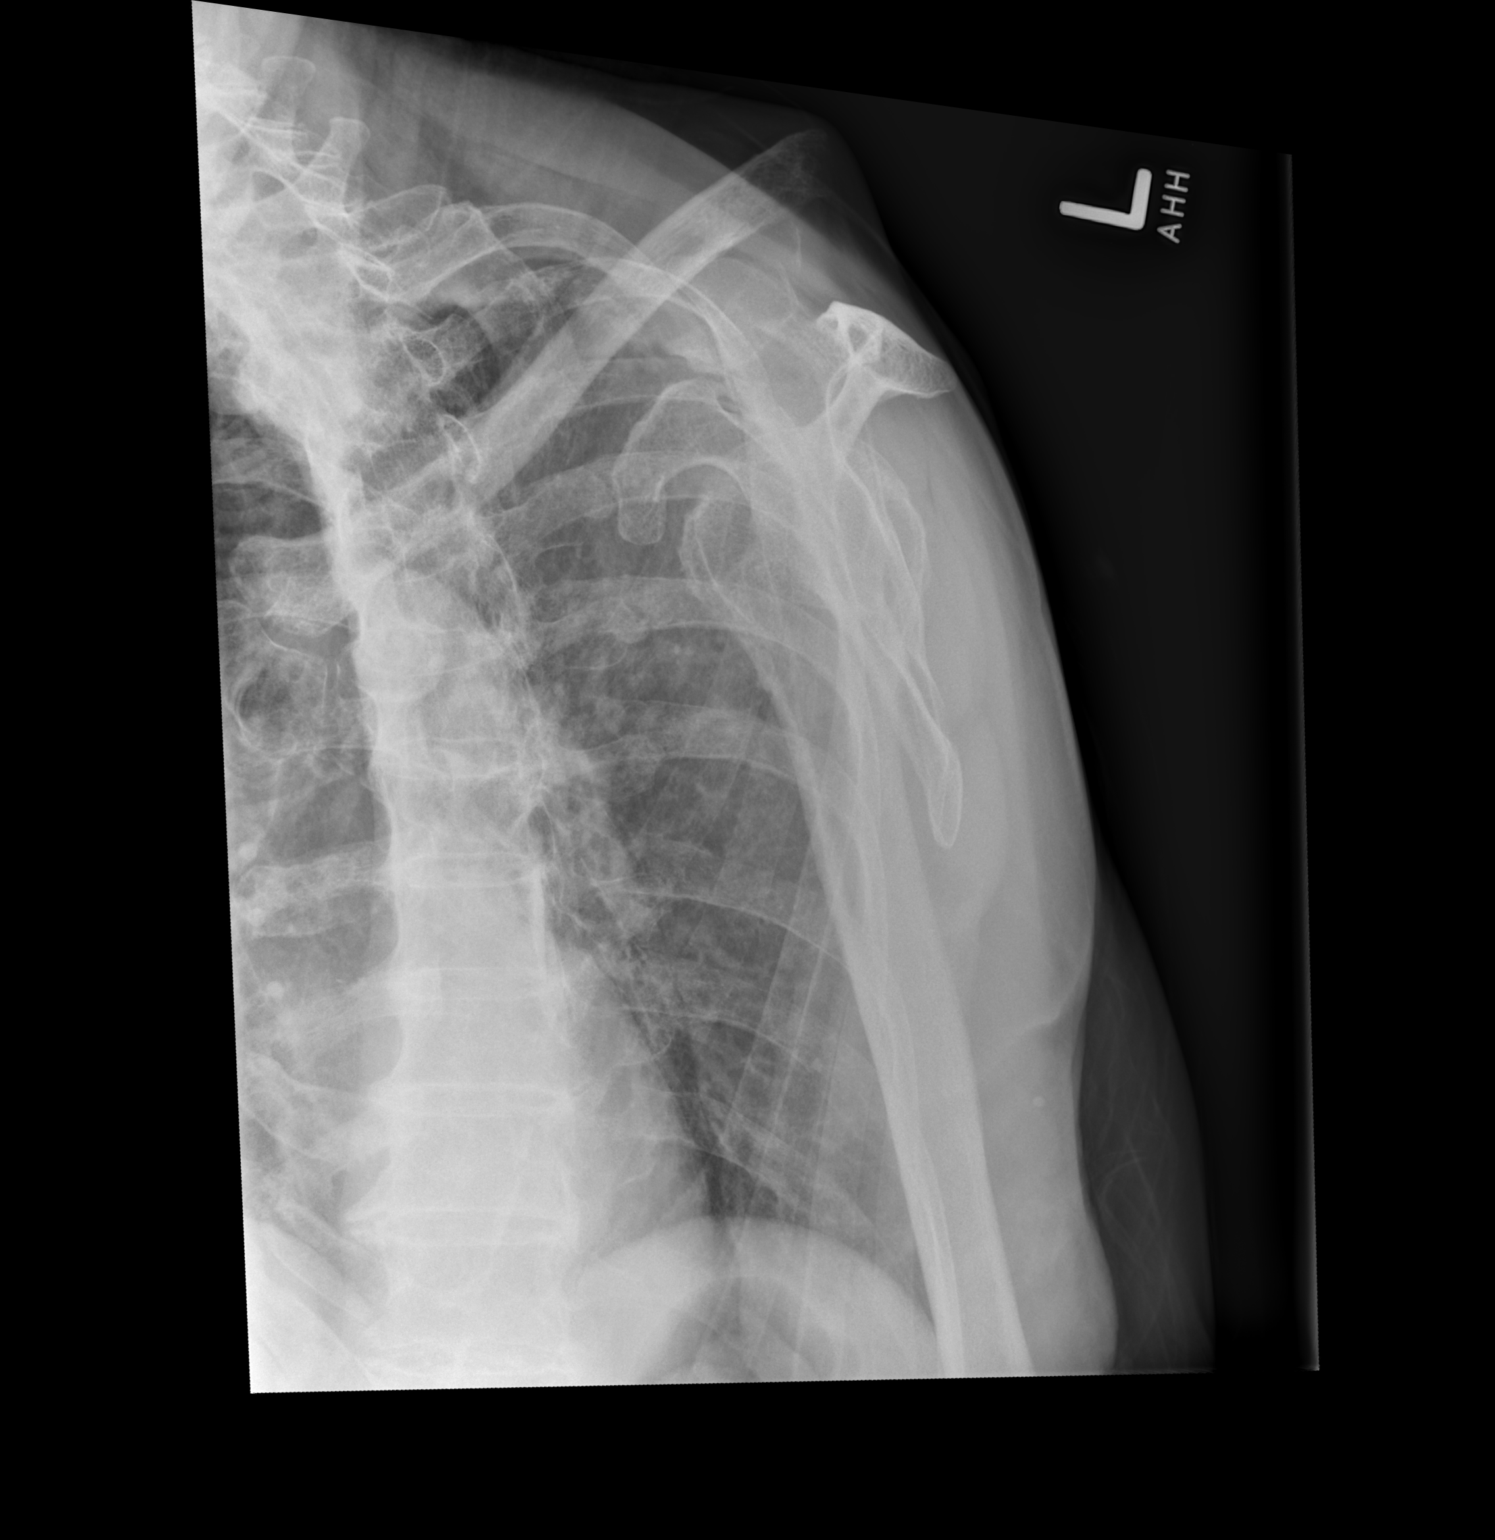

[x shoulder ap left (2 of 3)]
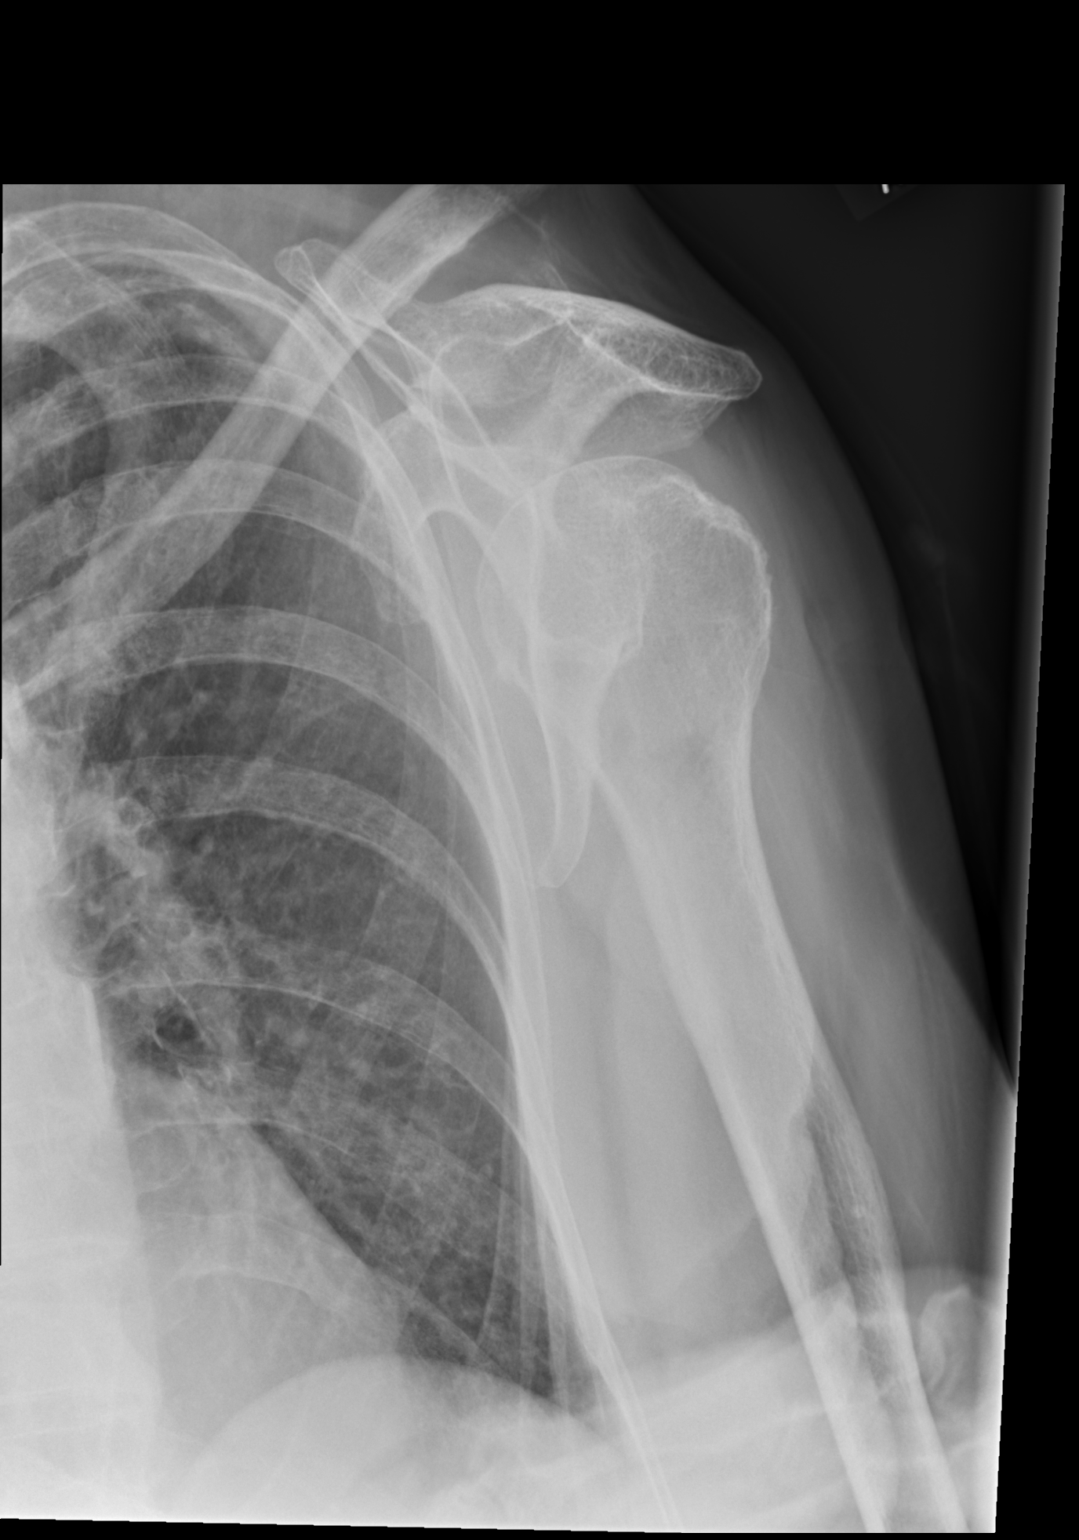

[x shoulder ap left (3 of 3)]
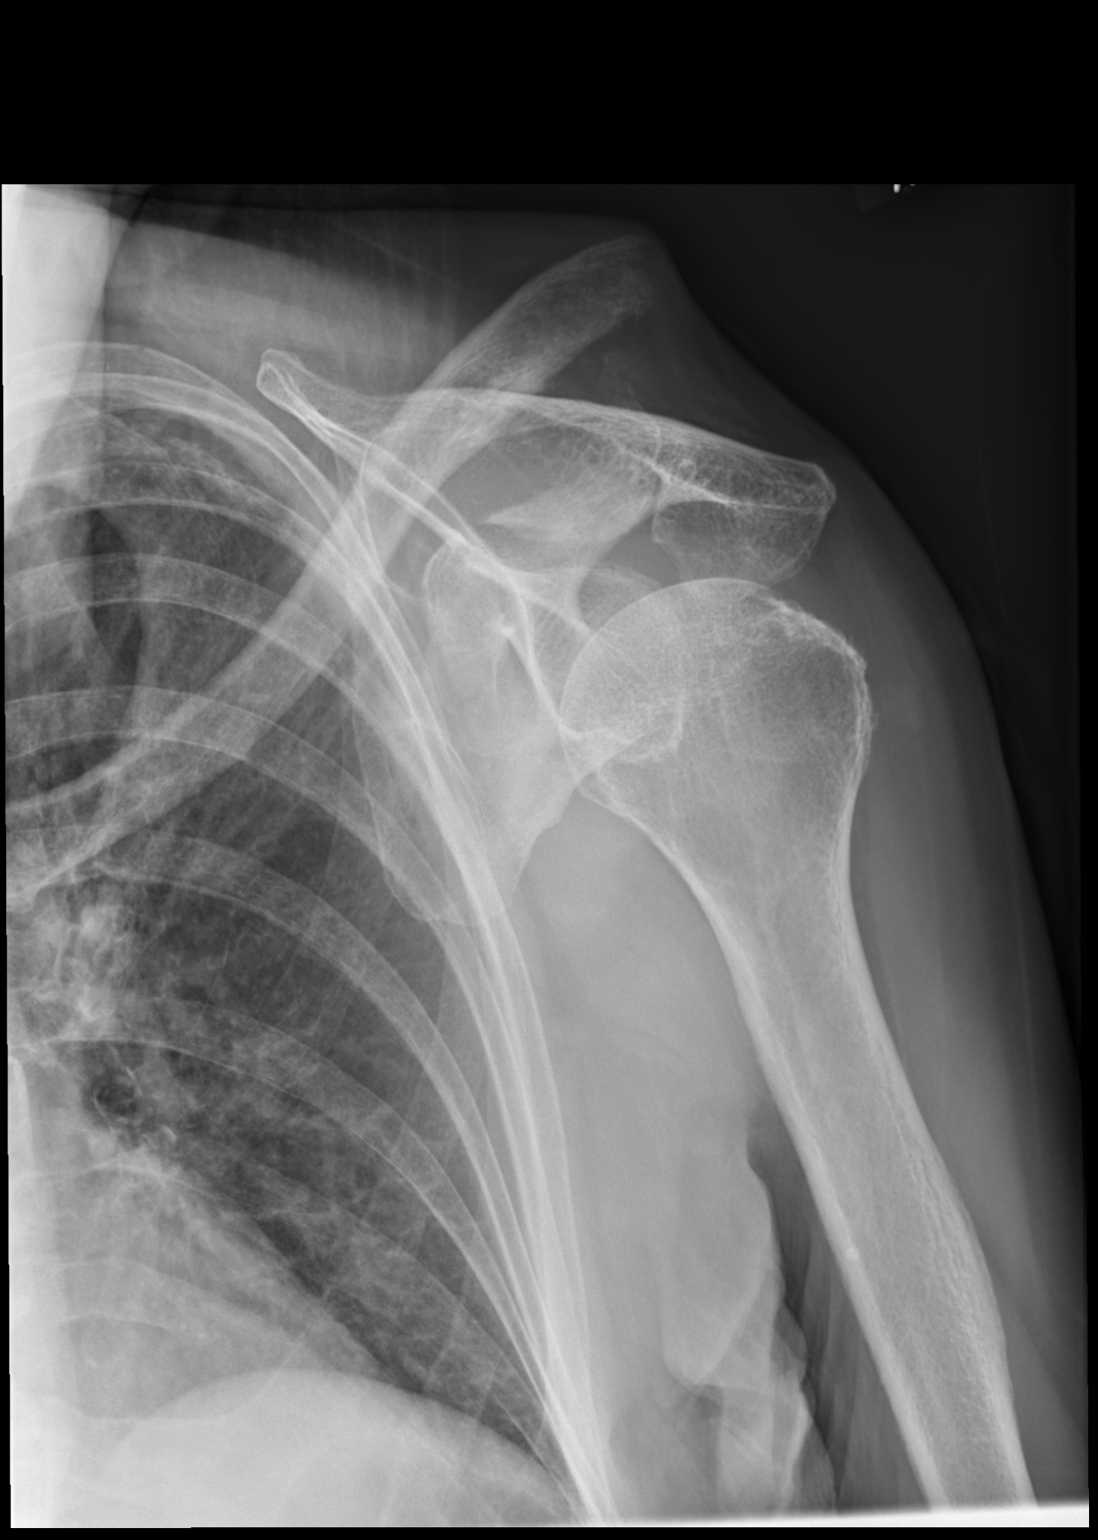

[3 of 3 positions shown; findings below may reference images not displayed]

FINDINGS: Displaced fracture of the distal left clavicle, marked superior
elevation of the proximal fracture fragment. Left humeral head
appears normally positioned relative to the glenoid fossa. Left
upper ribs appear intact and normally aligned.
IMPRESSION: Significantly displaced fracture of the distal left clavicle, with
marked superior dislocation of the proximal fracture fragment.

## 2018-07-21 IMAGING — CR DG CHEST 2V
2 series · 2 of 2 positions shown · non-contrast
Comparison: Chest x-ray dated 06/19/2011.

CLINICAL DATA: Unwitnessed fall today, deformity to left shoulder.

EXAM:
CHEST - 2 VIEW

[w chest lat]
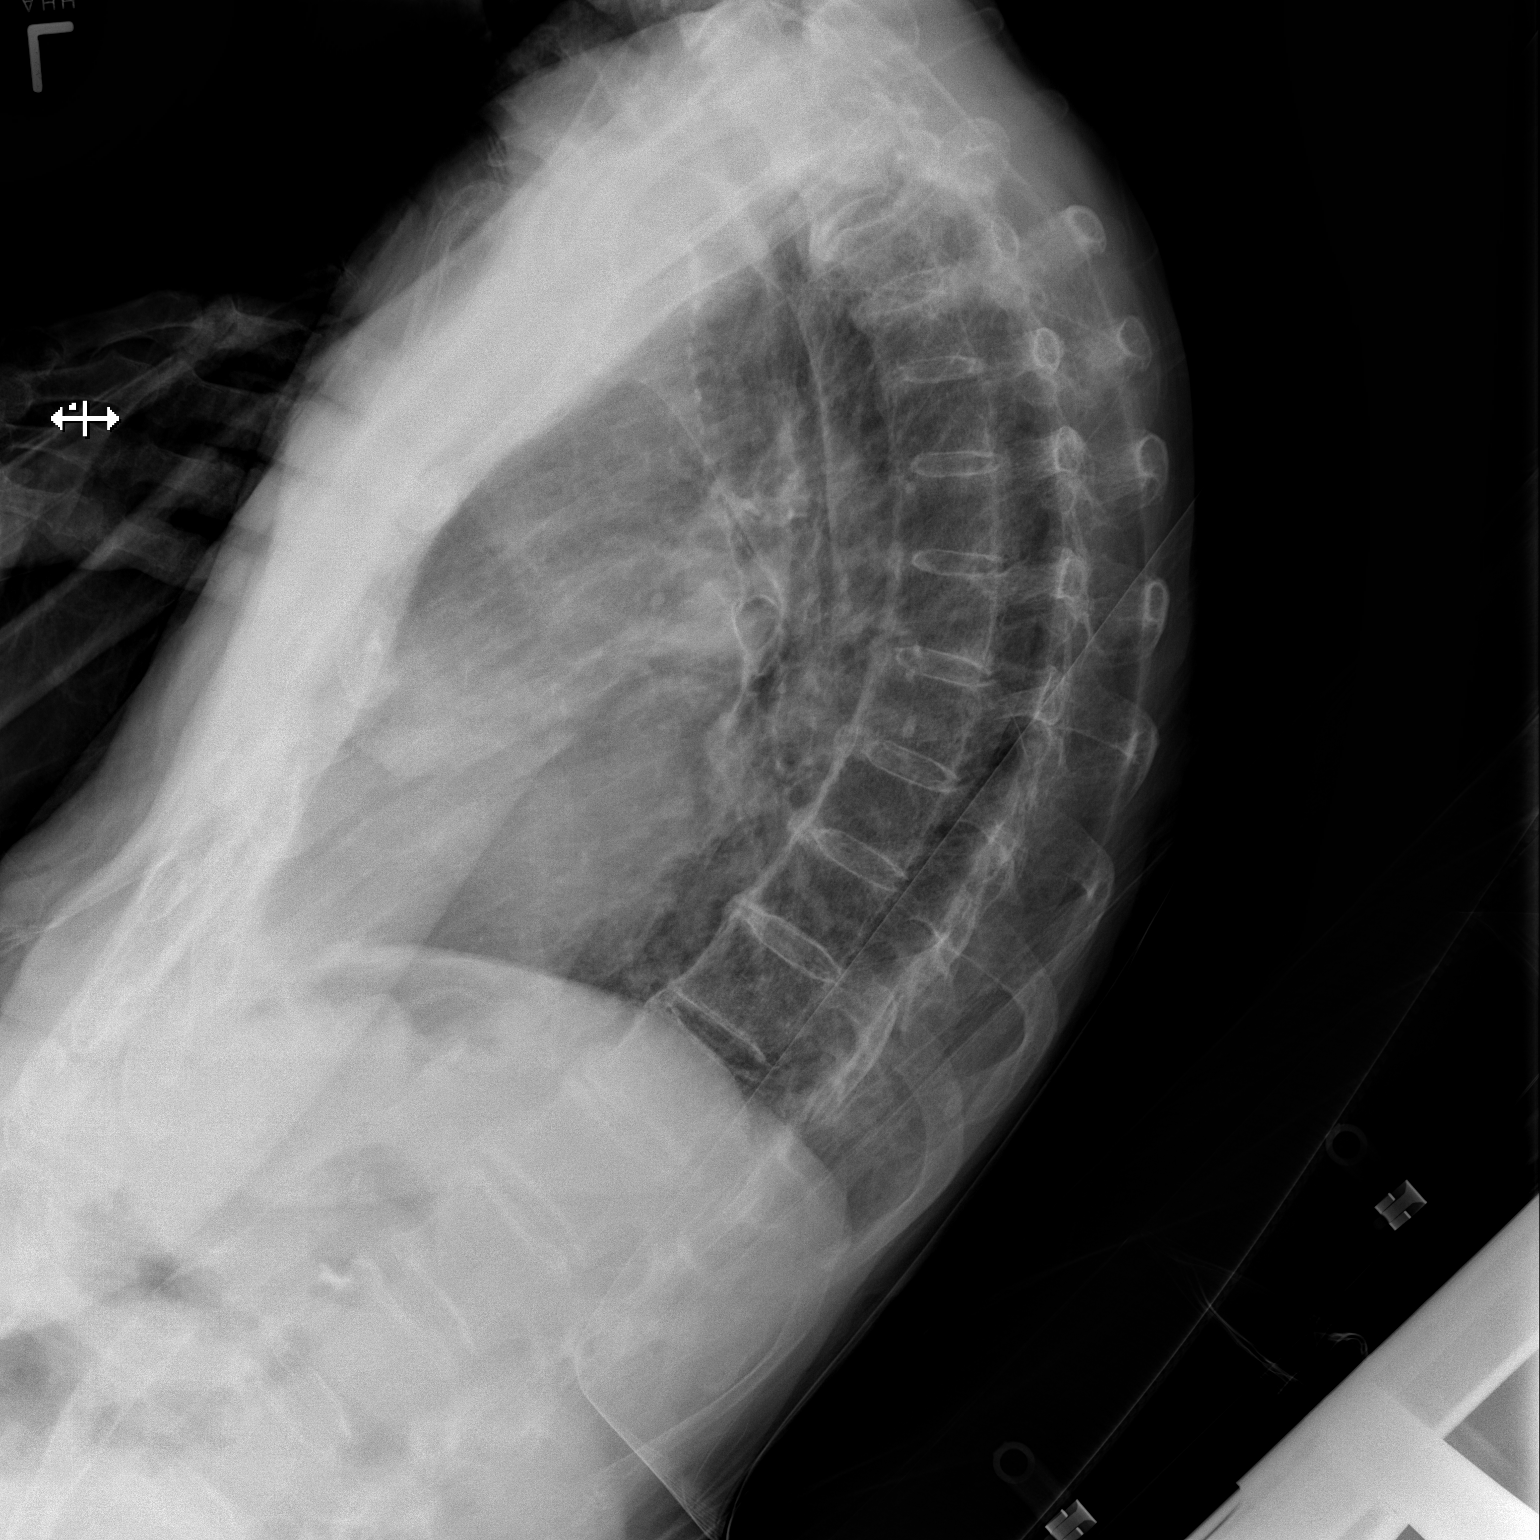

[x chest ap]
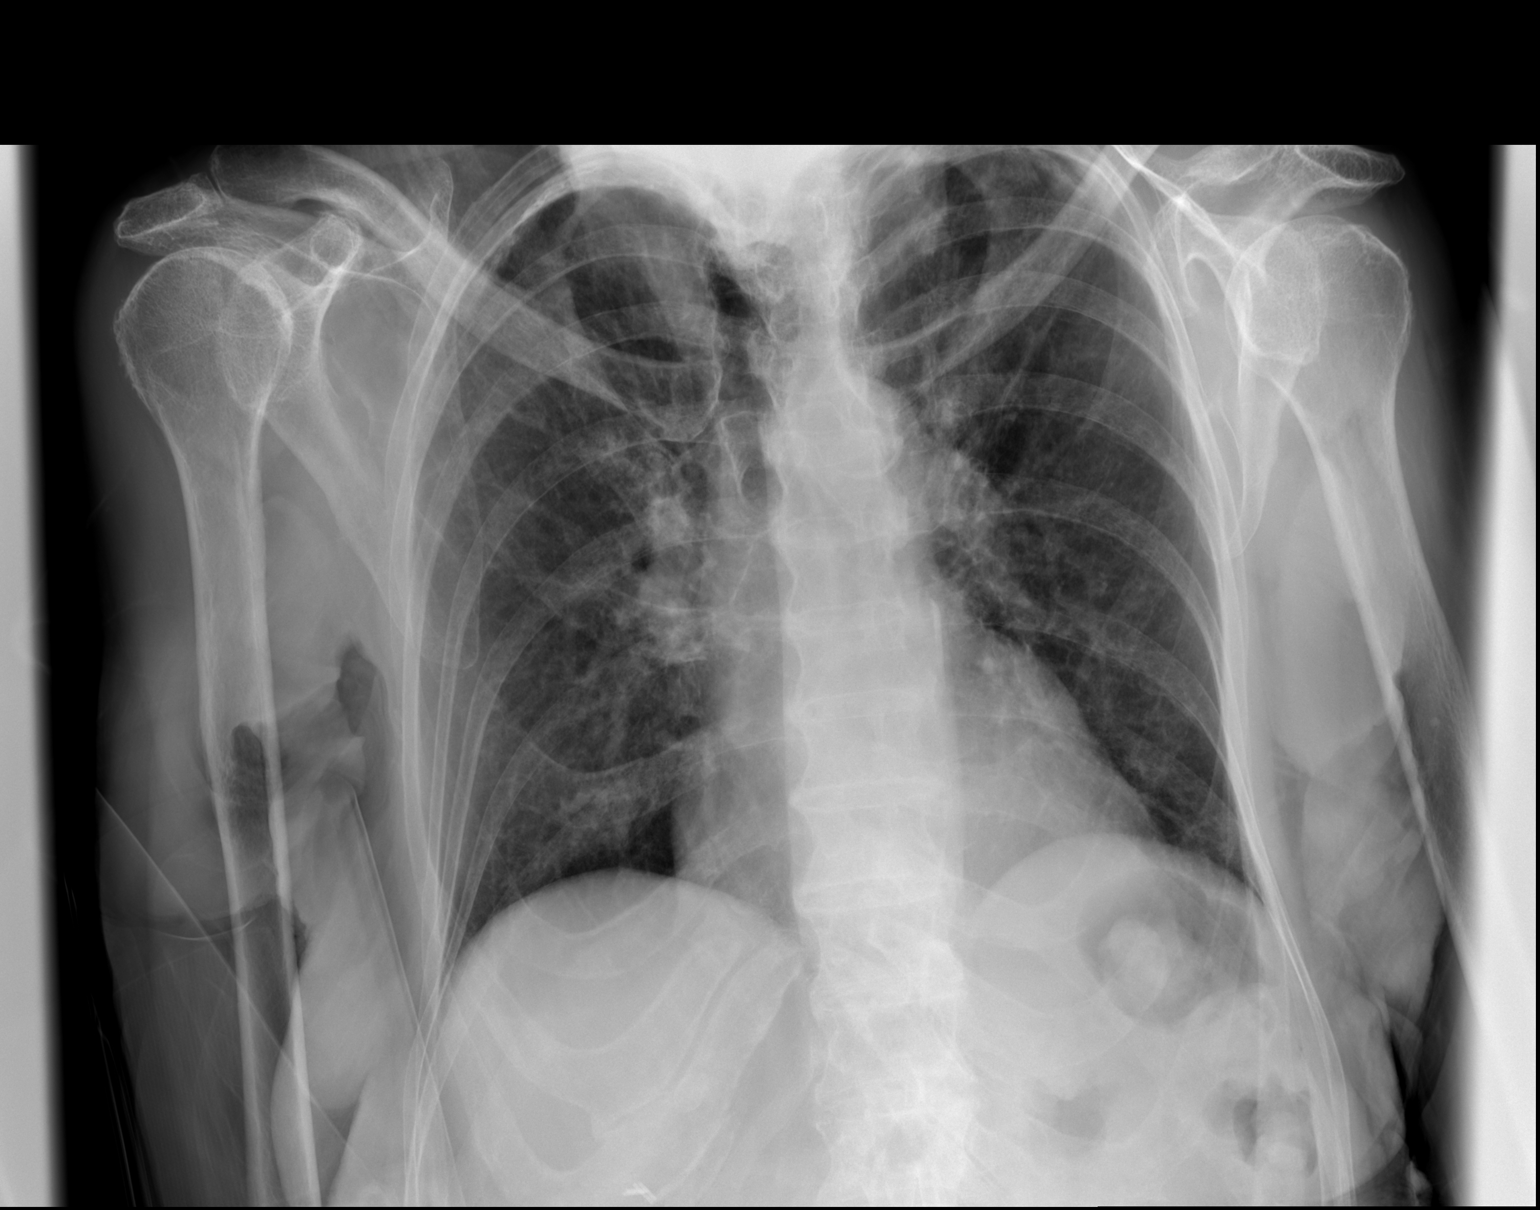

[2 of 2 positions shown; findings below may reference images not displayed]

FINDINGS: Heart size is upper normal. Overall cardiomediastinal silhouette is
within normal limits. Atherosclerotic changes noted at the aortic
arch.

Lungs are clear.  No pleural effusion or pneumothorax seen.

Markedly displaced fracture of the distal left clavicle. No other
osseous fracture or displacement seen. Ankylosis of the kyphotic
thoracic spine.
IMPRESSION: 1. No active cardiopulmonary disease. No evidence of pneumonia or
pulmonary edema.
2. Markedly displaced fracture of the distal left clavicle, with
marked superior displacement of the proximal fracture fragment.
3. Aortic atherosclerosis.
